# Patient Record
Sex: Female | Born: 1980 | Race: Black or African American | Hispanic: No | Marital: Married | Smoking: Never smoker
Health system: Southern US, Community
[De-identification: ages and names within clinical notes are randomized; demographics above are authoritative.]

## PROBLEM LIST (undated history)

## (undated) DIAGNOSIS — S0300XA Dislocation of jaw, unspecified side, initial encounter: Secondary | ICD-10-CM

## (undated) DIAGNOSIS — E669 Obesity, unspecified: Secondary | ICD-10-CM

## (undated) DIAGNOSIS — IMO0002 Reserved for concepts with insufficient information to code with codable children: Secondary | ICD-10-CM

## (undated) DIAGNOSIS — Z8042 Family history of malignant neoplasm of prostate: Secondary | ICD-10-CM

## (undated) DIAGNOSIS — Z803 Family history of malignant neoplasm of breast: Secondary | ICD-10-CM

## (undated) DIAGNOSIS — R87619 Unspecified abnormal cytological findings in specimens from cervix uteri: Secondary | ICD-10-CM

## (undated) DIAGNOSIS — M546 Pain in thoracic spine: Secondary | ICD-10-CM

## (undated) DIAGNOSIS — E282 Polycystic ovarian syndrome: Secondary | ICD-10-CM

## (undated) DIAGNOSIS — R7303 Prediabetes: Secondary | ICD-10-CM

## (undated) DIAGNOSIS — I10 Essential (primary) hypertension: Secondary | ICD-10-CM

## (undated) DIAGNOSIS — Z800 Family history of malignant neoplasm of digestive organs: Secondary | ICD-10-CM

## (undated) DIAGNOSIS — Z808 Family history of malignant neoplasm of other organs or systems: Secondary | ICD-10-CM

## (undated) DIAGNOSIS — R52 Pain, unspecified: Secondary | ICD-10-CM

## (undated) DIAGNOSIS — B349 Viral infection, unspecified: Secondary | ICD-10-CM

## (undated) DIAGNOSIS — E559 Vitamin D deficiency, unspecified: Secondary | ICD-10-CM

## (undated) DIAGNOSIS — J329 Chronic sinusitis, unspecified: Secondary | ICD-10-CM

## (undated) DIAGNOSIS — Z8041 Family history of malignant neoplasm of ovary: Secondary | ICD-10-CM

## (undated) DIAGNOSIS — R519 Headache, unspecified: Secondary | ICD-10-CM

## (undated) DIAGNOSIS — Z8049 Family history of malignant neoplasm of other genital organs: Secondary | ICD-10-CM

## (undated) DIAGNOSIS — E781 Pure hyperglyceridemia: Secondary | ICD-10-CM

## (undated) DIAGNOSIS — J45909 Unspecified asthma, uncomplicated: Secondary | ICD-10-CM

## (undated) HISTORY — DX: Polycystic ovarian syndrome: E28.2

## (undated) HISTORY — DX: Pain, unspecified: R52

## (undated) HISTORY — DX: Chronic sinusitis, unspecified: J32.9

## (undated) HISTORY — DX: Viral infection, unspecified: B34.9

## (undated) HISTORY — DX: Family history of malignant neoplasm of other organs or systems: Z80.8

## (undated) HISTORY — DX: Family history of malignant neoplasm of prostate: Z80.42

## (undated) HISTORY — DX: Dislocation of jaw, unspecified side, initial encounter: S03.00XA

## (undated) HISTORY — DX: Reserved for concepts with insufficient information to code with codable children: IMO0002

## (undated) HISTORY — DX: Family history of malignant neoplasm of breast: Z80.3

## (undated) HISTORY — DX: Vitamin D deficiency, unspecified: E55.9

## (undated) HISTORY — DX: Essential (primary) hypertension: I10

## (undated) HISTORY — DX: Obesity, unspecified: E66.9

## (undated) HISTORY — DX: Prediabetes: R73.03

## (undated) HISTORY — DX: Pure hyperglyceridemia: E78.1

## (undated) HISTORY — DX: Headache, unspecified: R51.9

## (undated) HISTORY — DX: Unspecified abnormal cytological findings in specimens from cervix uteri: R87.619

## (undated) HISTORY — DX: Family history of malignant neoplasm of ovary: Z80.41

## (undated) HISTORY — DX: Family history of malignant neoplasm of digestive organs: Z80.0

## (undated) HISTORY — DX: Family history of malignant neoplasm of other genital organs: Z80.49

## (undated) HISTORY — PX: LEEP: SHX91

## (undated) HISTORY — DX: Pain in thoracic spine: M54.6

---
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
## 2002-04-22 ENCOUNTER — Emergency Department (HOSPITAL_COMMUNITY): Admission: EM | Admit: 2002-04-22 | Discharge: 2002-04-22 | Payer: Self-pay | Admitting: Emergency Medicine

## 2002-08-11 ENCOUNTER — Inpatient Hospital Stay (HOSPITAL_COMMUNITY): Admission: AD | Admit: 2002-08-11 | Discharge: 2002-08-11 | Payer: Self-pay | Admitting: *Deleted

## 2002-08-28 ENCOUNTER — Encounter: Admission: RE | Admit: 2002-08-28 | Discharge: 2002-08-28 | Payer: Self-pay | Admitting: *Deleted

## 2002-11-04 ENCOUNTER — Emergency Department (HOSPITAL_COMMUNITY): Admission: EM | Admit: 2002-11-04 | Discharge: 2002-11-04 | Payer: Self-pay | Admitting: Emergency Medicine

## 2002-12-17 ENCOUNTER — Emergency Department (HOSPITAL_COMMUNITY): Admission: EM | Admit: 2002-12-17 | Discharge: 2002-12-17 | Payer: Self-pay | Admitting: Emergency Medicine

## 2003-05-21 ENCOUNTER — Other Ambulatory Visit: Admission: RE | Admit: 2003-05-21 | Discharge: 2003-05-21 | Payer: Self-pay | Admitting: Obstetrics and Gynecology

## 2003-06-01 ENCOUNTER — Emergency Department (HOSPITAL_COMMUNITY): Admission: EM | Admit: 2003-06-01 | Discharge: 2003-06-01 | Payer: Self-pay | Admitting: Emergency Medicine

## 2003-06-04 ENCOUNTER — Encounter: Payer: Self-pay | Admitting: Obstetrics and Gynecology

## 2003-06-04 ENCOUNTER — Ambulatory Visit (HOSPITAL_COMMUNITY): Admission: RE | Admit: 2003-06-04 | Discharge: 2003-06-04 | Payer: Self-pay | Admitting: Obstetrics and Gynecology

## 2003-06-10 ENCOUNTER — Inpatient Hospital Stay (HOSPITAL_COMMUNITY): Admission: AD | Admit: 2003-06-10 | Discharge: 2003-06-10 | Payer: Self-pay | Admitting: Obstetrics and Gynecology

## 2003-06-18 ENCOUNTER — Emergency Department (HOSPITAL_COMMUNITY): Admission: EM | Admit: 2003-06-18 | Discharge: 2003-06-18 | Payer: Self-pay | Admitting: Emergency Medicine

## 2003-09-20 ENCOUNTER — Emergency Department (HOSPITAL_COMMUNITY): Admission: EM | Admit: 2003-09-20 | Discharge: 2003-09-20 | Payer: Self-pay | Admitting: Emergency Medicine

## 2003-10-07 ENCOUNTER — Other Ambulatory Visit: Admission: RE | Admit: 2003-10-07 | Discharge: 2003-10-07 | Payer: Self-pay | Admitting: Obstetrics and Gynecology

## 2004-03-23 ENCOUNTER — Other Ambulatory Visit: Admission: RE | Admit: 2004-03-23 | Discharge: 2004-03-23 | Payer: Self-pay | Admitting: Obstetrics and Gynecology

## 2004-05-06 ENCOUNTER — Inpatient Hospital Stay (HOSPITAL_COMMUNITY): Admission: AD | Admit: 2004-05-06 | Discharge: 2004-05-06 | Payer: Self-pay | Admitting: Obstetrics and Gynecology

## 2004-05-07 ENCOUNTER — Inpatient Hospital Stay (HOSPITAL_COMMUNITY): Admission: AD | Admit: 2004-05-07 | Discharge: 2004-05-08 | Payer: Self-pay | Admitting: Obstetrics and Gynecology

## 2004-06-21 ENCOUNTER — Inpatient Hospital Stay (HOSPITAL_COMMUNITY): Admission: AD | Admit: 2004-06-21 | Discharge: 2004-06-21 | Payer: Self-pay | Admitting: Obstetrics and Gynecology

## 2004-08-15 ENCOUNTER — Ambulatory Visit (HOSPITAL_COMMUNITY): Admission: RE | Admit: 2004-08-15 | Discharge: 2004-08-15 | Payer: Self-pay | Admitting: Obstetrics and Gynecology

## 2004-08-25 ENCOUNTER — Inpatient Hospital Stay (HOSPITAL_COMMUNITY): Admission: AD | Admit: 2004-08-25 | Discharge: 2004-08-25 | Payer: Self-pay | Admitting: Obstetrics & Gynecology

## 2004-09-04 ENCOUNTER — Inpatient Hospital Stay (HOSPITAL_COMMUNITY): Admission: AD | Admit: 2004-09-04 | Discharge: 2004-09-05 | Payer: Self-pay | Admitting: Obstetrics and Gynecology

## 2004-09-11 HISTORY — PX: LEEP: SHX91

## 2004-09-16 ENCOUNTER — Encounter
Admission: RE | Admit: 2004-09-16 | Discharge: 2004-10-14 | Payer: Self-pay | Admitting: Physical Medicine and Rehabilitation

## 2004-11-22 ENCOUNTER — Inpatient Hospital Stay (HOSPITAL_COMMUNITY): Admission: AD | Admit: 2004-11-22 | Discharge: 2004-11-22 | Payer: Self-pay | Admitting: Obstetrics and Gynecology

## 2004-11-27 ENCOUNTER — Inpatient Hospital Stay (HOSPITAL_COMMUNITY): Admission: AD | Admit: 2004-11-27 | Discharge: 2004-11-27 | Payer: Self-pay | Admitting: Obstetrics and Gynecology

## 2004-11-29 ENCOUNTER — Inpatient Hospital Stay (HOSPITAL_COMMUNITY): Admission: AD | Admit: 2004-11-29 | Discharge: 2004-12-01 | Payer: Self-pay | Admitting: Obstetrics and Gynecology

## 2004-12-27 ENCOUNTER — Emergency Department (HOSPITAL_COMMUNITY): Admission: EM | Admit: 2004-12-27 | Discharge: 2004-12-27 | Payer: Self-pay | Admitting: Family Medicine

## 2005-01-03 ENCOUNTER — Other Ambulatory Visit: Admission: RE | Admit: 2005-01-03 | Discharge: 2005-01-03 | Payer: Self-pay | Admitting: Obstetrics and Gynecology

## 2005-08-28 ENCOUNTER — Encounter: Payer: Self-pay | Admitting: Emergency Medicine

## 2005-08-28 ENCOUNTER — Observation Stay (HOSPITAL_COMMUNITY): Admission: EM | Admit: 2005-08-28 | Discharge: 2005-08-29 | Payer: Self-pay | Admitting: Internal Medicine

## 2005-08-28 ENCOUNTER — Ambulatory Visit: Payer: Self-pay | Admitting: Internal Medicine

## 2005-08-29 ENCOUNTER — Emergency Department (HOSPITAL_COMMUNITY): Admission: EM | Admit: 2005-08-29 | Discharge: 2005-08-30 | Payer: Self-pay | Admitting: Emergency Medicine

## 2006-03-09 IMAGING — US US OB LIMITED
1 series · 13 of 28 positions shown · non-contrast
Comparison: none

CLINICAL DATA: 23-year-old.  38 weeks pregnancy with failed biophysical profile in office.

[Series 1: us ob limited · 0.37mm/px · 13 of 35 slices shown]
[im 2/35]
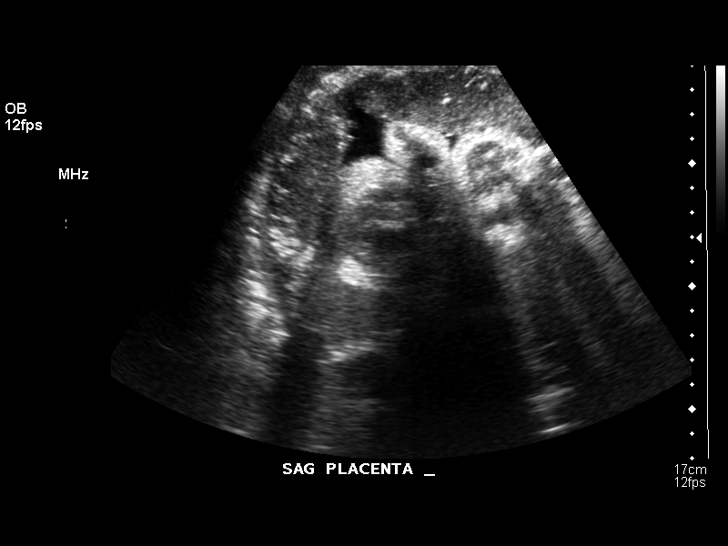
[im 4/35]
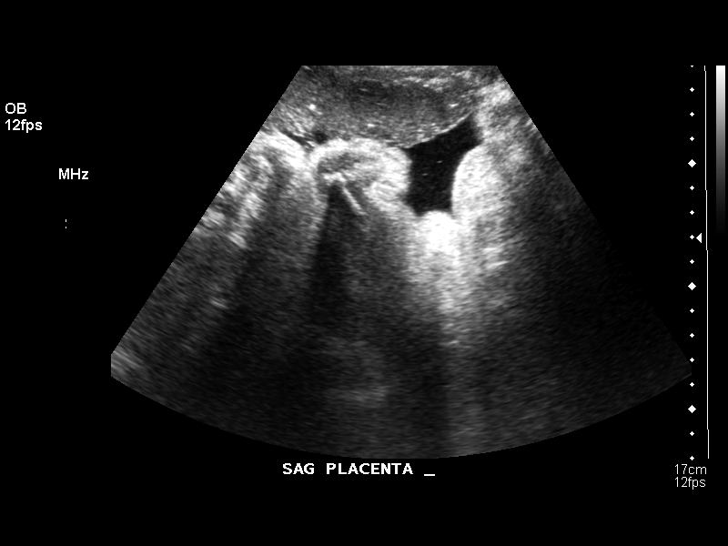
[im 7/35]
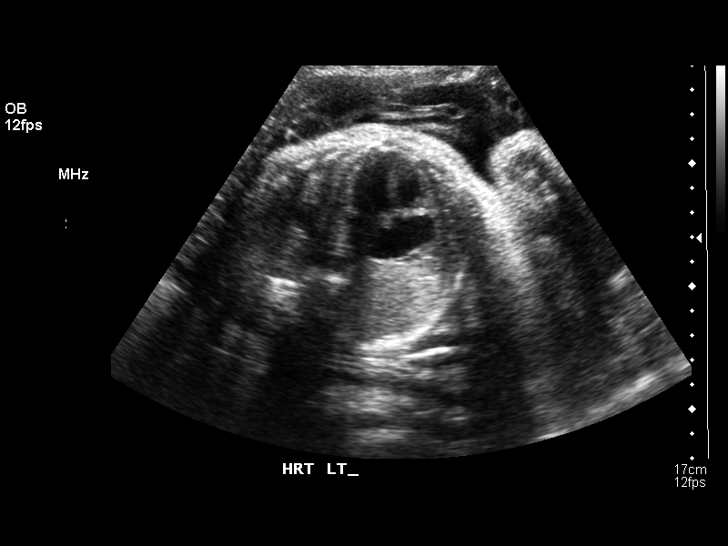
[im 9/35]
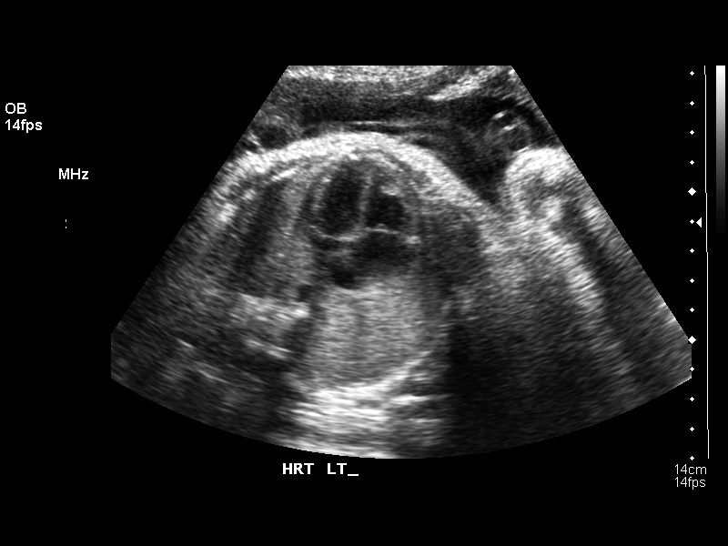
[im 12/35]
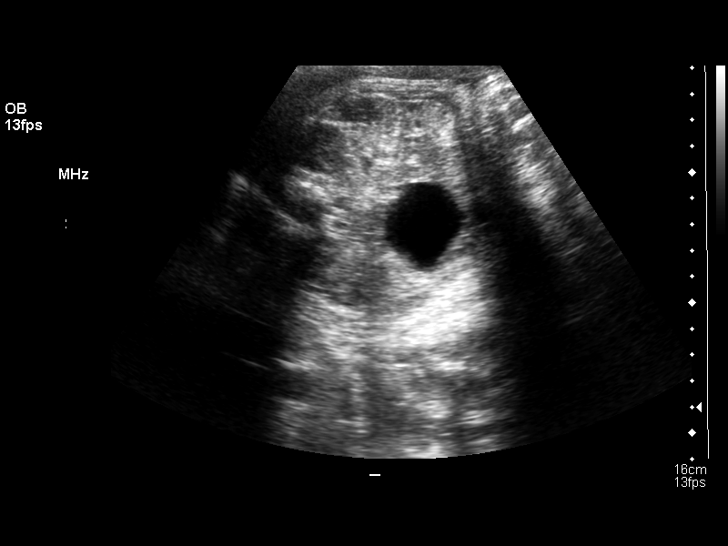
[im 14/35]
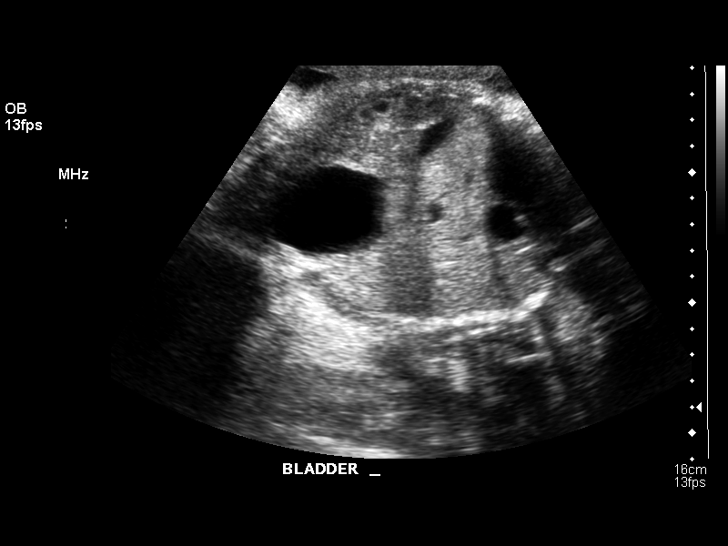
[im 18/35]
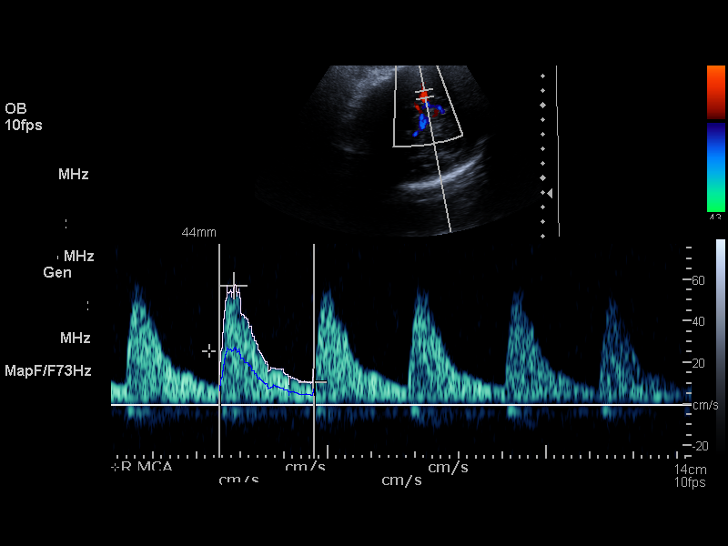
[im 21/35]
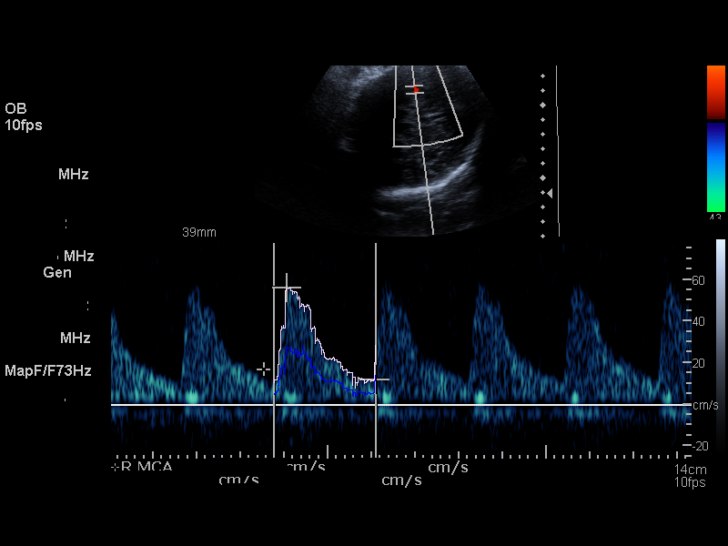
[im 23/35]
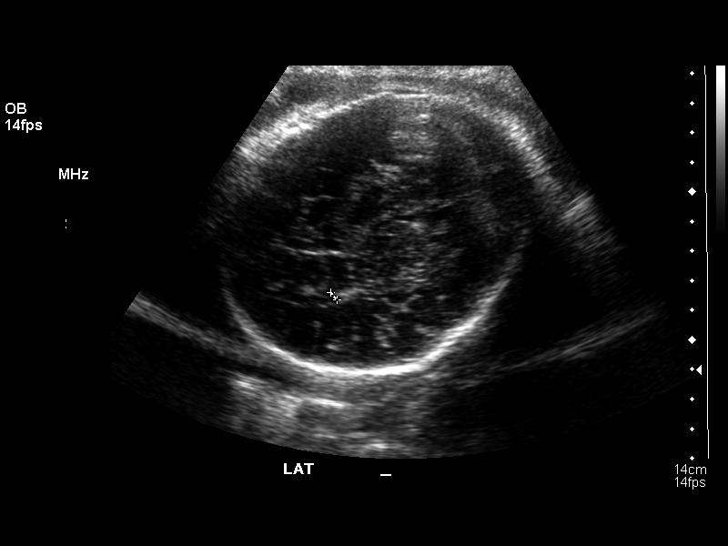
[im 26/35]
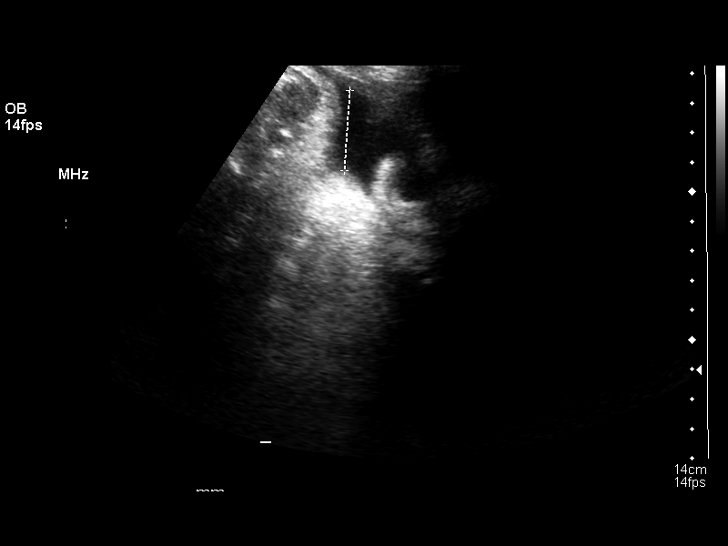
[im 28/35]
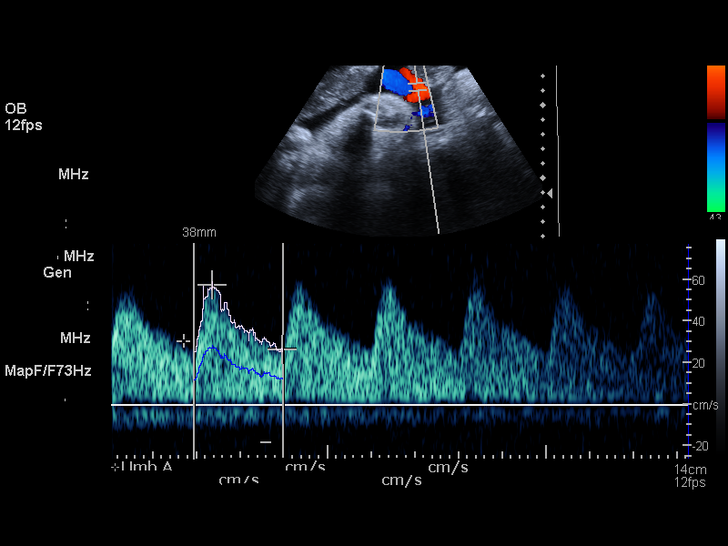
[im 31/35]
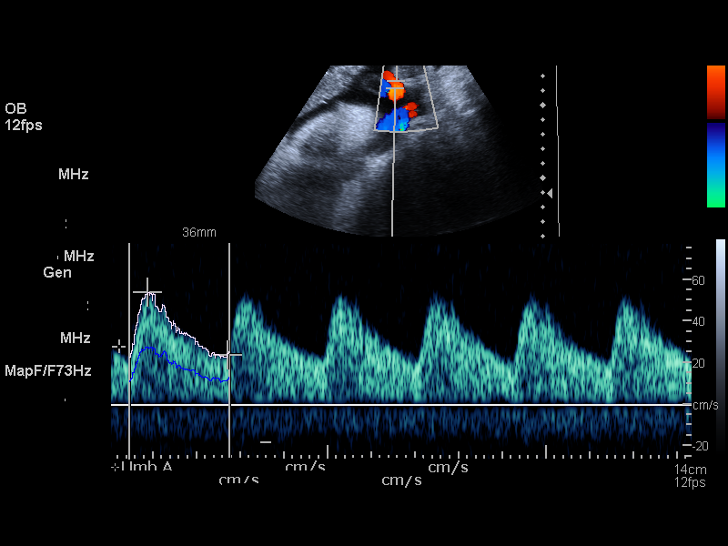
[im 33/35]
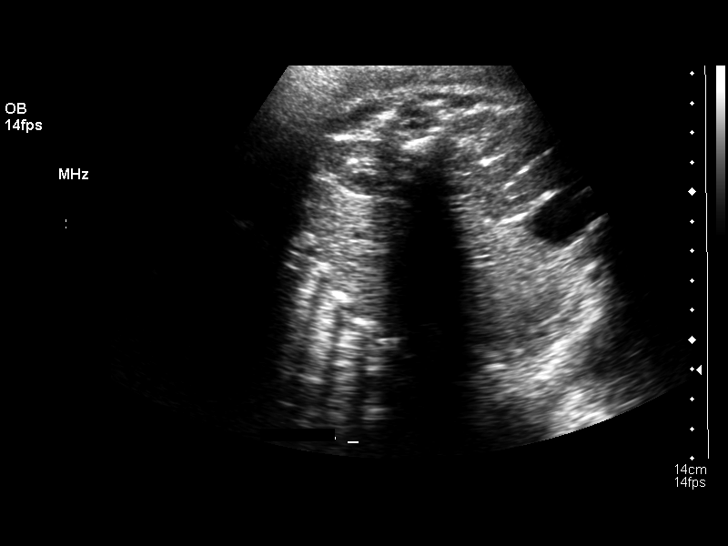

[13 of 28 positions shown; findings below may reference images not displayed]

LIMITED OBSTETRICAL ULTRASOUND:
 Number of Fetuses:  1
 Heart Rate:  133
 Movement:  Yes
 Breathing:  Yes
 Presentation:  Cephalic
 Placental Location:  Anterior
 Grade:  II
 Previa:  No
 Amniotic Fluid (Subjective):  Normal
 Amniotic Fluid (Objective):  12.2 cm AFI (5th -95th%ile = 7.3 ? 23.9 cm for 38 wks)

 Fetal measurements and complete anatomic evaluation were not requested.  The following fetal anatomy was visualized during this exam:  Lateral ventricles, stomach, 3-vessel cord, kidneys, bladder, and diaphragm.

 MATERNAL UTERINE AND ADNEXAL FINDINGS
 Cervix:  Not evaluated
 BIOPHYSICAL PROFILE

 Movement:  2      Time:  30 minutes
 Breathing:  2
 Tone:  2
 Amniotic Fluid:  2

 Total Score:  8

 DOPPLER ULTRASOUND OF FETUS:

 Umbilical Artery S/D Ratio:  2.34    (NL< 3.03)

 Middle Cerebral Artery PI: 1.62    (NL> 1.07)
IMPRESSION: 1.  Single living intrauterine fetus estimated at 37 weeks and 5 days by first ultrasound.  Fetus is in cephalic presentation with anterior grade II placenta and subjectively normal amniotic fluid volume.
 2.  Biophysical profile score [DATE] at 30 minutes.
 3.  Normal cord and MCA Doppler examination.

## 2006-04-03 ENCOUNTER — Inpatient Hospital Stay (HOSPITAL_COMMUNITY): Admission: AD | Admit: 2006-04-03 | Discharge: 2006-04-03 | Payer: Self-pay | Admitting: Obstetrics and Gynecology

## 2006-12-07 ENCOUNTER — Emergency Department (HOSPITAL_COMMUNITY): Admission: EM | Admit: 2006-12-07 | Discharge: 2006-12-07 | Payer: Self-pay | Admitting: Emergency Medicine

## 2007-01-01 ENCOUNTER — Inpatient Hospital Stay (HOSPITAL_COMMUNITY): Admission: AD | Admit: 2007-01-01 | Discharge: 2007-01-01 | Payer: Self-pay | Admitting: Obstetrics & Gynecology

## 2007-04-28 ENCOUNTER — Emergency Department (HOSPITAL_COMMUNITY): Admission: EM | Admit: 2007-04-28 | Discharge: 2007-04-28 | Payer: Self-pay | Admitting: *Deleted

## 2008-05-04 ENCOUNTER — Emergency Department (HOSPITAL_COMMUNITY): Admission: EM | Admit: 2008-05-04 | Discharge: 2008-05-04 | Payer: Self-pay | Admitting: Family Medicine

## 2008-12-08 ENCOUNTER — Emergency Department (HOSPITAL_COMMUNITY): Admission: EM | Admit: 2008-12-08 | Discharge: 2008-12-08 | Payer: Self-pay | Admitting: Family Medicine

## 2009-04-30 ENCOUNTER — Encounter (INDEPENDENT_AMBULATORY_CARE_PROVIDER_SITE_OTHER): Payer: Self-pay | Admitting: Obstetrics and Gynecology

## 2009-04-30 ENCOUNTER — Ambulatory Visit (HOSPITAL_COMMUNITY): Admission: RE | Admit: 2009-04-30 | Discharge: 2009-04-30 | Payer: Self-pay | Admitting: Obstetrics and Gynecology

## 2009-09-25 ENCOUNTER — Emergency Department (HOSPITAL_COMMUNITY): Admission: EM | Admit: 2009-09-25 | Discharge: 2009-09-25 | Payer: Self-pay | Admitting: Emergency Medicine

## 2009-10-24 ENCOUNTER — Emergency Department (HOSPITAL_BASED_OUTPATIENT_CLINIC_OR_DEPARTMENT_OTHER): Admission: EM | Admit: 2009-10-24 | Discharge: 2009-10-24 | Payer: Self-pay | Admitting: Emergency Medicine

## 2010-03-07 ENCOUNTER — Emergency Department (HOSPITAL_BASED_OUTPATIENT_CLINIC_OR_DEPARTMENT_OTHER): Admission: EM | Admit: 2010-03-07 | Discharge: 2010-03-08 | Payer: Self-pay | Admitting: Emergency Medicine

## 2010-09-25 ENCOUNTER — Inpatient Hospital Stay (HOSPITAL_COMMUNITY)
Admission: AD | Admit: 2010-09-25 | Discharge: 2010-09-25 | Payer: Self-pay | Source: Home / Self Care | Attending: Obstetrics and Gynecology | Admitting: Obstetrics and Gynecology

## 2010-09-26 LAB — URINALYSIS, ROUTINE W REFLEX MICROSCOPIC
Bilirubin Urine: NEGATIVE
Ketones, ur: NEGATIVE mg/dL
Nitrite: NEGATIVE
Protein, ur: NEGATIVE mg/dL
Specific Gravity, Urine: 1.02 (ref 1.005–1.030)
Urine Glucose, Fasting: NEGATIVE mg/dL
Urobilinogen, UA: 0.2 mg/dL (ref 0.0–1.0)
pH: 6.5 (ref 5.0–8.0)

## 2010-09-26 LAB — WET PREP, GENITAL
Clue Cells Wet Prep HPF POC: NONE SEEN
Trich, Wet Prep: NONE SEEN

## 2010-09-26 LAB — URINE MICROSCOPIC-ADD ON

## 2010-09-26 LAB — POCT PREGNANCY, URINE: Preg Test, Ur: NEGATIVE

## 2010-09-28 LAB — GC/CHLAMYDIA PROBE AMP, GENITAL
Chlamydia, DNA Probe: NEGATIVE
GC Probe Amp, Genital: NEGATIVE

## 2010-10-20 ENCOUNTER — Encounter: Payer: Self-pay | Admitting: Physician Assistant

## 2010-10-24 ENCOUNTER — Encounter: Payer: Self-pay | Admitting: Obstetrics and Gynecology

## 2010-10-24 ENCOUNTER — Other Ambulatory Visit: Payer: Self-pay | Admitting: Obstetrics and Gynecology

## 2010-10-24 ENCOUNTER — Encounter (INDEPENDENT_AMBULATORY_CARE_PROVIDER_SITE_OTHER): Payer: Self-pay | Admitting: Obstetrics and Gynecology

## 2010-10-24 DIAGNOSIS — R109 Unspecified abdominal pain: Secondary | ICD-10-CM

## 2010-10-24 DIAGNOSIS — N912 Amenorrhea, unspecified: Secondary | ICD-10-CM

## 2010-10-24 DIAGNOSIS — R1084 Generalized abdominal pain: Secondary | ICD-10-CM

## 2010-10-24 LAB — CONVERTED CEMR LAB
FSH: 4.9 milliintl units/mL
LH: 8.9 milliintl units/mL
Prolactin: 5.6 ng/mL
Sex Hormone Binding: 162 nmol/L — ABNORMAL HIGH (ref 18–114)
Testosterone Free: 2.2 pg/mL (ref 0.6–6.8)
Testosterone-% Free: 0.5 % (ref 0.4–2.4)
Testosterone: 40.63 ng/dL (ref 10–70)

## 2010-10-27 ENCOUNTER — Ambulatory Visit (HOSPITAL_COMMUNITY)
Admission: RE | Admit: 2010-10-27 | Discharge: 2010-10-27 | Disposition: A | Discharge status: Home / Self Care | Payer: Self-pay | Source: Ambulatory Visit | Attending: Obstetrics and Gynecology | Admitting: Obstetrics and Gynecology

## 2010-10-27 ENCOUNTER — Ambulatory Visit (HOSPITAL_COMMUNITY): Payer: Self-pay

## 2010-10-27 DIAGNOSIS — R109 Unspecified abdominal pain: Secondary | ICD-10-CM

## 2010-10-27 DIAGNOSIS — N838 Other noninflammatory disorders of ovary, fallopian tube and broad ligament: Secondary | ICD-10-CM | POA: Insufficient documentation

## 2010-10-27 DIAGNOSIS — R1031 Right lower quadrant pain: Secondary | ICD-10-CM | POA: Insufficient documentation

## 2010-10-27 DIAGNOSIS — N912 Amenorrhea, unspecified: Secondary | ICD-10-CM | POA: Insufficient documentation

## 2010-10-27 MED ORDER — IOHEXOL 300 MG/ML  SOLN
100.0000 mL | Freq: Once | INTRAMUSCULAR | Status: AC | PRN
  Administered 2010-10-27: 100 mL via INTRAVENOUS

## 2010-11-10 ENCOUNTER — Ambulatory Visit (INDEPENDENT_AMBULATORY_CARE_PROVIDER_SITE_OTHER): Payer: Self-pay | Admitting: Obstetrics and Gynecology

## 2010-11-10 DIAGNOSIS — N9489 Other specified conditions associated with female genital organs and menstrual cycle: Secondary | ICD-10-CM

## 2010-12-02 NOTE — Progress Notes (Unsigned)
NAME:  Jasmine, Castaneda NO.:  [id number]  MEDICAL RECORD NO.:  [id number]           PATIENT TYPE:  A  LOCATION:  [facility]                   FACILITY:  [facility]  PHYSICIAN:  Argentina Donovan, MD        DATE OF BIRTH:  10-24-1980  DATE OF SERVICE:  10/24/2010                                 CLINIC NOTE  The patient is a 30 year old African American married female, gravida 1, para 1-0-0-1 with a child 28 years old.  Following the birth of her baby, she got a Mirena IUD, had in about 4 years and had repeated episodes of vulval vaginitis, bacterial and yeast infections that she states, in for that reason, it was removed.  She never had another period.  Although they gave her some birth control pills at that time, told her to start them when she had a period.  She kept them for 2 years, not having had a period and then started them just in the beginning of January.  She at that time had gone to a dermatologist because of a darkened spot on her face and they did a biopsy of that.  She told him that she was on the pill, they told her to stop it because they did not know what is causing discoloration, not knowing I expect how long she had been on and then she had a period that stopped of several weeks later she had another. For several years she has had abdominal pain.  She has no GI complaints except constipation and no significant pelvic complaints.  On examination, her abdomen is soft, flat, quite tender in the periumbilical and just up especially below the umbilicus area and when pushing deeply on any portion of the abdomen that seems to be the area that hurts although when you ask with the pain she points to the lower right quadrant.  She has no guarding or rebound.  On pelvic exam, the external genitalia is normal.  BUS within normal limits.  Vagina is clean and well rugated.  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  She had been referred here by the [facility] where she went, they diagnosed urinary tract infection and yeast infection and put her on Diflucan.  She has allergies to SULFA and is on no medications at the present time.  Her story is quite confusing although she is a good historian.  She has a surgical history of a D&C and a LEEP procedure and fortunately her last Pap was normal.  I am not exactly sure what is going on with this lady.  She does not look like polycystic ovarian syndrome.  She is 168 pounds and 5 feet 8 inches tall.  She says that the pain is in her abdomen was periodic until just recently its become much more frequent and steady.  She wakes her up sometimes at night and she apparently has just been able to put up with it.  I am going to get a CT of the abdomen with and without, and also with pelvic sonogram, I am  going to get FSH, LH, prolactin, free testosterone levels, and I am going to have her come back in 2 weeks and see if we can figure out what is going on with this woman, I am truly confused by her story.  IMPRESSION:  Abdominal pain, long periods of amenorrhea with recent period probably started by oral contraceptives.          ______________________________ Argentina Donovan, MD    PR/MEDQ  D:  10/24/2010  T:  10/25/2010  Job:  [id number]

## 2010-12-02 NOTE — Progress Notes (Unsigned)
NAME:  Jasmine Castaneda, Jasmine Castaneda NO.:  [id number]  MEDICAL RECORD NO.:  [id number]           PATIENT TYPE:  A  LOCATION:  [facility]                   FACILITY:  [facility]  PHYSICIAN:  Argentina Donovan, MD        DATE OF BIRTH:  03-31-1981  DATE OF SERVICE:                                 CLINIC NOTE  HISTORY OF PRESENT ILLNESS:  The patient is a 30 year old gravida 1, para 1-0-70-69 who is 67-year-old whose history has been reviewed with the previous note.  She developed irregular periods and came in.  She certainly does not look like polycystic ovarian syndrome.  We had a CT because of some abdominal pain which was negative.  The only abnormalities when we did her hormone levels was a sex hormone binding globulin of 162, but her free testosterone, FSH, LH were all normal. The patient, however, on CT of the abdomen and pelvis revealed prominent ovaries bilaterally, compatible with sonographic findings of polycystic ovarian syndrome.  What we are doing with the patient is giving her at her request after discussing polycystic ovarian syndrome with her Provera for the first 10 days of each month.  We have told her that once she gets pregnant, she stops that wait a few months and then if necessary, she will be treated with hormone.  IMPRESSION:  Polycystic ovarian syndrome.          ______________________________ Argentina Donovan, MD    PR/MEDQ  D:  11/10/2010  T:  11/11/2010  Job:  [id number]

## 2010-12-17 LAB — CBC
HCT: 39.9 % (ref 36.0–46.0)
Hemoglobin: 13.6 g/dL (ref 12.0–15.0)
MCHC: 34.1 g/dL (ref 30.0–36.0)
MCV: 92.6 fL (ref 78.0–100.0)
Platelets: 270 10*3/uL (ref 150–400)
RBC: 4.31 MIL/uL (ref 3.87–5.11)
RDW: 12.3 % (ref 11.5–15.5)
WBC: 4.2 10*3/uL (ref 4.0–10.5)

## 2010-12-17 LAB — PREGNANCY, URINE: Preg Test, Ur: NEGATIVE

## 2011-01-16 ENCOUNTER — Ambulatory Visit (INDEPENDENT_AMBULATORY_CARE_PROVIDER_SITE_OTHER): Payer: Self-pay | Admitting: Obstetrics and Gynecology

## 2011-01-16 DIAGNOSIS — N912 Amenorrhea, unspecified: Secondary | ICD-10-CM

## 2011-01-17 NOTE — Group Therapy Note (Signed)
NAME:  VALYN, LATCHFORD NO.:  [id number]  MEDICAL RECORD NO.:  [id number]           PATIENT TYPE:  A  LOCATION:  [facility]                   [facility]ACILITY[facility]  PHYSICIAN:  Argentina Donovan, MD        DATE OF BIRTH:  01-25-1981  DATE OF SERVICE:  01/16/2011                                 CLINIC NOTE  HISTORY OF PRESENT ILLNESS:  The patient is a 30 year old gravida 1, para 1-39-39-29 with 74-year-old who has polycystic ovarian syndrome and had anovulation.  She has been taking Provera for the first 10 days of each month and desires pregnancy.  We have given her temperature chart, discussed how to take it, started on Clomid 50 from day 5 through 9 of cycle and with 3 renewals.  If she is not pregnant at the end of the time, she will come back and we will reevaluate, possibly increase the Clomid dose.  IMPRESSION: 1. Polycystic ovarian syndrome. 2. Anovulation. 3. Amenorrhea. 4. Intention of pregnancy.          ______________________________ Argentina Donovan, MD    PR/MEDQ  D:  01/16/2011  T:  01/17/2011  Job:  [id number]

## 2011-01-24 NOTE — Op Note (Signed)
Jasmine Castaneda, Jasmine Castaneda               ACCOUNT NO.:  [id number]   MEDICAL RECORD NO.:  [id number]          PATIENT TYPE:  AMB   LOCATION:  [facility]                           [facility]   PHYSICIAN:  Dineen Kid. Rana Snare, M.D.    DATE OF BIRTH:  [birth year]   DATE OF PROCEDURE:  04/30/2009  DATE OF DISCHARGE:  04/30/2009                               OPERATIVE REPORT   PREOPERATIVE DIAGNOSES:  Abnormal uterine bleeding and irregular  endometrium.   POSTOPERATIVE DIAGNOSES:  Abnormal uterine bleeding and irregular  endometrium.   PROCEDURE:  Hysteroscopy and dilation and curettage.   SURGEON:  Dineen Kid. Rana Snare, M.D.   ANESTHESIA:  Monitored anesthetic care and paracervical block.   INDICATIONS:  Jasmine Castaneda is a 30 year old with irregular bleeding  despite birth control pills.  She has been bleeding for the last 2-3  weeks, underwent a saline infusion ultrasound showing a very irregular  endometrium.  She desires definitive surgical intervention, requests  hysteroscopy, dilation and curettage.  Risks and benefits of the  procedure were discussed at length.  Informed consent was obtained.   FINDINGS:  At the time of surgery, very irregular endometrium otherwise  normal-appearing ostia and cervix after the D&C.   DESCRIPTION OF PROCEDURE:  After adequate analgesia, the patient placed  in the dorsal lithotomy position.  She was sterilely prepped and draped.  The bladder was sterilely drained.  Graves speculum was placed.  Tenaculum placed on the anterior lip of the cervix.  Paracervical block  was placed in 1% Xylocaine 1:100,000 epinephrine, total of 20 mL.  Uterus was sounded 8 cm, easily dilated with #27 Shawnie Pons dilators.  Hysteroscope was inserted.  The above findings were noted.  Curettage  performed retrieving small fragments of endometrium until a gritty  surface was felt throughout the endometrial cavity.  Reexamination  revealed normal-appearing endometrial cavity, normal-appearing ostia  and  cervix.  Hysteroscope was removed.  Tenaculum at the anterior lip of the  cervix was noted to be hemostatic.  The patient was then transferred to  the recovery room in stable condition.  Sponge and instrument count was  normal x3.  Estimated blood loss was minimal.  Sorbitol deficit 50 mL.   DISPOSITION:  The patient will be discharged home and will follow up in  the office in 2-3 weeks.  Given the routine instruction sheet for D and  C.  Told to return for increased pain, fever, or bleeding.  She should  start her birth control pills in the next menstrual cycle.  She did  receive 1 g of cefotetan preoperatively and 30 mg Toradol  postoperatively.     Dineen Kid Rana Snare, M.D.  Electronically Signed    DCL/MEDQ  D:  04/30/2009  T:  05/01/2009  Job:  [id number]
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
## 2011-03-08 ENCOUNTER — Ambulatory Visit: Payer: Self-pay | Admitting: Obstetrics and Gynecology

## 2011-04-19 ENCOUNTER — Encounter: Payer: Self-pay | Admitting: *Deleted

## 2011-04-19 DIAGNOSIS — E282 Polycystic ovarian syndrome: Secondary | ICD-10-CM | POA: Insufficient documentation

## 2011-04-19 DIAGNOSIS — N970 Female infertility associated with anovulation: Secondary | ICD-10-CM | POA: Insufficient documentation

## 2011-04-19 DIAGNOSIS — R109 Unspecified abdominal pain: Secondary | ICD-10-CM | POA: Insufficient documentation

## 2011-04-19 DIAGNOSIS — N912 Amenorrhea, unspecified: Secondary | ICD-10-CM

## 2011-04-20 ENCOUNTER — Ambulatory Visit: Payer: Self-pay | Admitting: Obstetrics and Gynecology

## 2011-06-05 ENCOUNTER — Emergency Department (HOSPITAL_BASED_OUTPATIENT_CLINIC_OR_DEPARTMENT_OTHER)
Admission: EM | Admit: 2011-06-05 | Discharge: 2011-06-05 | Disposition: A | Discharge status: Home / Self Care | Payer: Self-pay | Attending: Emergency Medicine | Admitting: Emergency Medicine

## 2011-06-05 ENCOUNTER — Encounter (HOSPITAL_BASED_OUTPATIENT_CLINIC_OR_DEPARTMENT_OTHER): Payer: Self-pay

## 2011-06-05 DIAGNOSIS — R21 Rash and other nonspecific skin eruption: Secondary | ICD-10-CM | POA: Insufficient documentation

## 2011-06-05 DIAGNOSIS — Z79899 Other long term (current) drug therapy: Secondary | ICD-10-CM | POA: Insufficient documentation

## 2011-06-05 LAB — CBC
HCT: 36.1 % (ref 36.0–46.0)
Hemoglobin: 12.4 g/dL (ref 12.0–15.0)
RBC: 4.03 MIL/uL (ref 3.87–5.11)
WBC: 5 10*3/uL (ref 4.0–10.5)

## 2011-06-05 LAB — DIFFERENTIAL
Basophils Absolute: 0 10*3/uL (ref 0.0–0.1)
Lymphocytes Relative: 47 % — ABNORMAL HIGH (ref 12–46)
Lymphs Abs: 2.3 10*3/uL (ref 0.7–4.0)
Monocytes Absolute: 0.4 10*3/uL (ref 0.1–1.0)
Monocytes Relative: 7 % (ref 3–12)
Neutro Abs: 2.2 10*3/uL (ref 1.7–7.7)

## 2011-06-05 LAB — BASIC METABOLIC PANEL
BUN: 10 mg/dL (ref 6–23)
CO2: 25 mEq/L (ref 19–32)
Chloride: 103 mEq/L (ref 96–112)
Creatinine, Ser: 0.8 mg/dL (ref 0.50–1.10)
Glucose, Bld: 84 mg/dL (ref 70–99)

## 2011-06-05 MED ORDER — CEPHALEXIN 250 MG PO CAPS
500.0000 mg | ORAL_CAPSULE | Freq: Once | ORAL | Status: AC
  Administered 2011-06-05: 500 mg via ORAL
  Filled 2011-06-05: qty 2

## 2011-06-05 MED ORDER — DIPHENHYDRAMINE HCL 25 MG PO TABS: 25.0000 mg | ORAL_TABLET | Freq: Four times a day (QID) | ORAL | Status: AC

## 2011-06-05 MED ORDER — PREDNISONE 50 MG PO TABS
60.0000 mg | ORAL_TABLET | Freq: Once | ORAL | Status: AC
  Administered 2011-06-05: 60 mg via ORAL

## 2011-06-05 MED ORDER — DOXYCYCLINE HYCLATE 100 MG PO CAPS: 100.0000 mg | ORAL_CAPSULE | Freq: Two times a day (BID) | ORAL | Status: AC

## 2011-06-05 MED ORDER — PREDNISONE 20 MG PO TABS
ORAL_TABLET | ORAL | Status: AC
  Filled 2011-06-05: qty 3

## 2011-06-05 MED ORDER — PREDNISONE 10 MG PO TABS: 20.0000 mg | ORAL_TABLET | Freq: Every day | ORAL | Status: AC

## 2011-06-05 NOTE — ED Provider Notes (Signed)
History    Scribed for Hilario Quarry, MD, the patient was seen in room MH11/MH11. This chart was scribed by Katha Cabal. This patient's care was started at 7:07 PM.     CSN: [id number] Arrival date & time: 06/05/2011  6:20 PM  Chief Complaint  Patient presents with  . Leg Pain    HPI  (Consider location/radiation/quality/duration/timing/severity/associated sxs/prior treatment)  HPI Jasmine Castaneda is a 30 y.o. female who presents to the Emergency Department complaining of gradual spreading of rash bilateral lower extremity rash that began suddenly at church yesterday. Denies SOB, cough, rhinorrhea, weakness, headache.  Rash began as one knot and spread across both legs in 36 hours.  Patient had fever of 100.2.  Patient was wearing a dress and sat in her regular seat at church.  Patient took Benadryl and Tylenol.  LNMP:  06/04/2011 No PCP  Dr. Okey Dupre at [facility]   PAST MEDICAL HISTORY:  Past Medical History  Diagnosis Date  . Abnormal Pap smear     leep  . Infertility     PAST SURGICAL HISTORY:  History reviewed. No pertinent past surgical history.  FAMILY HISTORY:  Family History  Problem Relation Age of Onset  . Hypertension Father   . Cancer Mother      SOCIAL HISTORY: History   Social History  . Marital Status: Married    Spouse Name: N/A    Number of Children: N/A  . Years of Education: N/A   Social History Main Topics  . Smoking status: Never Smoker   . Smokeless tobacco: None  . Alcohol Use: No  . Drug Use: No  . Sexually Active: None   Other Topics Concern  . None   Social History Narrative  . None    Review of Systems  Review of Systems 10 Systems reviewed and are negative for acute change except as noted in the HPI.  Allergies  Septra  Home Medications   Current Outpatient Rx  Name Route Sig Dispense Refill  . ACETAMINOPHEN 500 MG PO TABS Oral Take 1,000 mg by mouth every 6 (six) hours as needed. pain     .  CLOMIPHENE CITRATE 50 MG PO TABS Oral Take 50 mg by mouth daily. Take on days 5-9 of cycle     . DIPHENHYDRAMINE HCL (SLEEP) 25 MG PO TABS Oral Take 25 mg by mouth at bedtime as needed.      Marland Kitchen MEDROXYPROGESTERONE ACETATE 10 MG PO TABS Oral Take 10 mg by mouth daily. Take on the first 10 days of the month     . ONE-DAILY MULTI VITAMINS PO TABS Oral Take 1 tablet by mouth daily.      Marland Kitchen DIPHENHYDRAMINE HCL 25 MG PO TABS Oral Take 1 tablet (25 mg total) by mouth every 6 (six) hours. 20 tablet 0  . DOXYCYCLINE HYCLATE 100 MG PO CAPS Oral Take 1 capsule (100 mg total) by mouth 2 (two) times daily. 20 capsule 0  . PREDNISONE 10 MG PO TABS Oral Take 2 tablets (20 mg total) by mouth daily. 15 tablet 0    Physical Exam    BP 126/84  Pulse 72  Temp(Src) 98.6 F (37 C) (Oral)  Resp 16  Ht 5\' 8"  (1.727 m)  Wt 167 lb (75.751 kg)  BMI 25.39 kg/m2  SpO2 100%  LMP 06/04/2011  Physical Exam  Constitutional: She is oriented to person, place, and time. She appears well-developed and well-nourished.  HENT:  Head: Normocephalic and  atraumatic.  Eyes: EOM are normal. Pupils are equal, round, and reactive to light.  Neck: Neck supple.  Cardiovascular: Normal rate and regular rhythm.   Pulmonary/Chest: Effort normal. No respiratory distress.  Abdominal: Soft. There is no tenderness.  Musculoskeletal: Normal range of motion.  Neurological: She is alert and oriented to person, place, and time.  Skin: Skin is warm. Rash noted. There is erythema.       Erythematous, Indurated, influctuant area about 12x12 cm on posterior aspect of bilateral thighs   Psychiatric: She has a normal mood and affect. Her behavior is normal.    ED Course  Procedures (including critical care time)  OTHER DATA REVIEWED: Nursing notes, vital signs, and past medical records reviewed.   DIAGNOSTIC STUDIES: Oxygen Saturation is 100% on room air, normal by my interpretation.      LABS / RADIOLOGY:  Results for orders placed  during the hospital encounter of 06/05/11  CBC      Component Value Range   WBC 5.0  4.0 - 10.5 (K/uL)   RBC 4.03  3.87 - 5.11 (MIL/uL)   Hemoglobin 12.4  12.0 - 15.0 (g/dL)   HCT 30.8  65.7 - 84.6 (%)   MCV 89.6  78.0 - 100.0 (fL)   MCH 30.8  26.0 - 34.0 (pg)   MCHC 34.3  30.0 - 36.0 (g/dL)   RDW 96.2 (*) 95.2 - 15.5 (%)   Platelets 267  150 - 400 (K/uL)  DIFFERENTIAL      Component Value Range   Neutrophils Relative 44  43 - 77 (%)   Neutro Abs 2.2  1.7 - 7.7 (K/uL)   Lymphocytes Relative 47 (*) 12 - 46 (%)   Lymphs Abs 2.3  0.7 - 4.0 (K/uL)   Monocytes Relative 7  3 - 12 (%)   Monocytes Absolute 0.4  0.1 - 1.0 (K/uL)   Eosinophils Relative 2  0 - 5 (%)   Eosinophils Absolute 0.1  0.0 - 0.7 (K/uL)   Basophils Relative 1  0 - 1 (%)   Basophils Absolute 0.0  0.0 - 0.1 (K/uL)  BASIC METABOLIC PANEL      Component Value Range   Sodium 137  135 - 145 (mEq/L)   Potassium 4.2  3.5 - 5.1 (mEq/L)   Chloride 103  96 - 112 (mEq/L)   CO2 25  19 - 32 (mEq/L)   Glucose, Bld 84  70 - 99 (mg/dL)   BUN 10  6 - 23 (mg/dL)   Creatinine, Ser 8.41  0.50 - 1.10 (mg/dL)   Calcium 9.1  8.4 - 32.4 (mg/dL)   GFR calc non Af Amer >60  >60 (mL/min)   GFR calc Af Amer >60  >60 (mL/min)    No results found.    ED COURSE / COORDINATION OF CARE:  Orders Placed This Encounter  Procedures  . CBC  . Differential  . Basic metabolic panel    MDM:  Treat for contact dermatitis and infection.  Patient to continue taking Benadryl.   Will give patient Prednisone.  Patient is to return to ED if fever returns.    RN is to draw lines around rash. Return to ED for follow up tomorrow.    IMPRESSION: Diagnoses that have been ruled out:  Diagnoses that are still under consideration:  Final diagnoses:  Rash     MEDICATIONS GIVEN IN THE E.D. Scheduled Meds:    . cephALEXin  500 mg Oral Once  . predniSONE      .  predniSONE  60 mg Oral Once   Continuous Infusions:     DISCHARGE  MEDICATIONS: New Prescriptions   DIPHENHYDRAMINE (BENADRYL) 25 MG TABLET    Take 1 tablet (25 mg total) by mouth every 6 (six) hours.   DOXYCYCLINE (VIBRAMYCIN) 100 MG CAPSULE    Take 1 capsule (100 mg total) by mouth 2 (two) times daily.   PREDNISONE (DELTASONE) 10 MG TABLET    Take 2 tablets (20 mg total) by mouth daily.      I personally performed the services described in this documentation, which was scribed in my presence. The recorded information has been reviewed and considered. Hilario Quarry, MD          Hilario Quarry, MD 06/05/11 [phone]

## 2011-06-05 NOTE — ED Notes (Signed)
Borders of redness on bil legs marked with marker per md request

## 2011-06-05 NOTE — ED Notes (Signed)
C/o "knots to back of my legs"-first noticed while seated in church yesterday

## 2011-06-15 ENCOUNTER — Ambulatory Visit (INDEPENDENT_AMBULATORY_CARE_PROVIDER_SITE_OTHER): Payer: Self-pay | Admitting: Obstetrics and Gynecology

## 2011-06-15 ENCOUNTER — Encounter: Payer: Self-pay | Admitting: Obstetrics and Gynecology

## 2011-06-15 DIAGNOSIS — N898 Other specified noninflammatory disorders of vagina: Secondary | ICD-10-CM

## 2011-06-15 NOTE — Progress Notes (Signed)
States when made appointment was having vaginal pain and discharge, and no period- is on clomid and provera- not sure if took medications correctly- has several questions-Had a period 38th day of cycle- also still has lower abdominal intermittent pain

## 2011-06-23 LAB — ETHANOL: Alcohol, Ethyl (B): 165 — ABNORMAL HIGH

## 2011-06-27 NOTE — Progress Notes (Signed)
Pt having vaginal discharge and is on Clomid. Left without being seen

## 2011-07-06 ENCOUNTER — Encounter: Payer: Self-pay | Admitting: Obstetrics and Gynecology

## 2011-07-06 ENCOUNTER — Ambulatory Visit (INDEPENDENT_AMBULATORY_CARE_PROVIDER_SITE_OTHER): Payer: Self-pay | Admitting: Obstetrics and Gynecology

## 2011-07-06 VITALS — BP 131/84 | HR 85 | Temp 98.0°F | Ht 67.0 in | Wt 169.7 lb

## 2011-07-06 DIAGNOSIS — E282 Polycystic ovarian syndrome: Secondary | ICD-10-CM

## 2011-07-06 MED ORDER — METFORMIN HCL 500 MG PO TABS: 500.0000 mg | ORAL_TABLET | Freq: Two times a day (BID) | ORAL | Status: DC

## 2011-07-06 NOTE — Progress Notes (Signed)
Pt declines wanting the flu vaccine

## 2011-07-06 NOTE — Progress Notes (Signed)
The patient is a 30 year old gravida 1 para 0101 was been attempting to get pregnant but has polycystic ovarian syndrome. She had what sounds like an allergic reaction to the Clomid so we've told her not to take any more. We'll going to try her on low-dose metformin 500 twice a day for several months to see whether that will get her ovulating. She is on a temperature chart but she did not bring in she's been complaining of some sharp pains in the lower abdomen it seemed to be coming up from the vagina physical examination at this time was nonrevealing I talked to about mittelschmerz is difficult to know whether or not this is what she is experiencing and she has not had regular periods. She does occasionally have a spontaneous. The pain seemed to come 2 weeks prior to that.

## 2011-07-08 ENCOUNTER — Emergency Department (HOSPITAL_BASED_OUTPATIENT_CLINIC_OR_DEPARTMENT_OTHER)
Admission: EM | Admit: 2011-07-08 | Discharge: 2011-07-08 | Disposition: A | Discharge status: Home / Self Care | Payer: Self-pay | Attending: Emergency Medicine | Admitting: Emergency Medicine

## 2011-07-08 ENCOUNTER — Encounter (HOSPITAL_BASED_OUTPATIENT_CLINIC_OR_DEPARTMENT_OTHER): Payer: Self-pay

## 2011-07-08 DIAGNOSIS — L509 Urticaria, unspecified: Secondary | ICD-10-CM | POA: Insufficient documentation

## 2011-07-08 DIAGNOSIS — Z79899 Other long term (current) drug therapy: Secondary | ICD-10-CM | POA: Insufficient documentation

## 2011-07-08 MED ORDER — PREDNISONE 10 MG PO TABS: 20.0000 mg | ORAL_TABLET | Freq: Every day | ORAL | Status: AC

## 2011-07-08 MED ORDER — PREDNISONE 50 MG PO TABS
60.0000 mg | ORAL_TABLET | Freq: Once | ORAL | Status: AC
  Administered 2011-07-08: 60 mg via ORAL
  Filled 2011-07-08: qty 1

## 2011-07-08 MED ORDER — DIPHENHYDRAMINE HCL 25 MG PO CAPS
ORAL_CAPSULE | ORAL | Status: AC
  Administered 2011-07-08: 50 mg via ORAL
  Filled 2011-07-08: qty 2

## 2011-07-08 MED ORDER — EPINEPHRINE 0.3 MG/0.3ML IJ DEVI: 0.3000 mg | INTRAMUSCULAR | Status: DC | PRN

## 2011-07-08 MED ORDER — DIPHENHYDRAMINE HCL 50 MG PO CAPS
50.0000 mg | ORAL_CAPSULE | Freq: Once | ORAL | Status: AC
  Administered 2011-07-08: 50 mg via ORAL
  Filled 2011-07-08: qty 1

## 2011-07-08 MED ORDER — HYDROXYZINE HCL 25 MG PO TABS: 25.0000 mg | ORAL_TABLET | Freq: Four times a day (QID) | ORAL | Status: AC

## 2011-07-08 MED ORDER — FAMOTIDINE 20 MG PO TABS
40.0000 mg | ORAL_TABLET | Freq: Once | ORAL | Status: AC
  Administered 2011-07-08: 40 mg via ORAL
  Filled 2011-07-08: qty 2

## 2011-07-08 NOTE — ED Provider Notes (Signed)
History     CSN: [id number] Arrival date & time: 07/08/2011  2:37 AM   First MD Initiated Contact with Patient 07/08/11 0252      Chief Complaint  Patient presents with  . Allergic Reaction    (Consider location/radiation/quality/duration/timing/severity/associated sxs/prior treatment) HPI Comments: Pt woke up tonight with rash/itching.  Feels like she is having some swelling to throat/face.  Has had allergic reaction last month as well.  Has not seen an allergist.  No new meds or exposures.  No bites/stings.  Patient is a 30 y.o. female presenting with allergic reaction. The history is provided by the patient.  Allergic Reaction The primary symptoms are  dizziness and rash. The primary symptoms do not include shortness of breath, cough, abdominal pain, nausea, vomiting or diarrhea. The current episode started 1 to 2 hours ago. The problem has been gradually worsening. This is a new problem.  Dizziness does not occur with nausea, vomiting, weakness or diaphoresis.  The rash is associated with itching.  Significant symptoms also include itching. Significant symptoms that are not present include rhinorrhea.    Past Medical History  Diagnosis Date  . Abnormal Pap smear     leep  . Infertility     History reviewed. No pertinent past surgical history.  Family History  Problem Relation Age of Onset  . Hypertension Father   . Cancer Mother     History  Substance Use Topics  . Smoking status: Never Smoker   . Smokeless tobacco: Not on file  . Alcohol Use: No    OB History    Grav Para Term Preterm Abortions TAB SAB Ect Mult Living   1 1  1  0 0 0 0 0 1      Review of Systems  Constitutional: Negative for fever, chills, diaphoresis and fatigue.  HENT: Positive for voice change. Negative for congestion, rhinorrhea, sneezing, drooling and trouble swallowing.   Eyes: Negative.   Respiratory: Negative for cough, chest tightness and shortness of breath.   Cardiovascular:  Negative for chest pain and leg swelling.  Gastrointestinal: Negative for nausea, vomiting, abdominal pain, diarrhea and blood in stool.  Genitourinary: Negative for frequency, hematuria, flank pain and difficulty urinating.  Musculoskeletal: Negative for back pain and arthralgias.  Skin: Positive for itching and rash.  Neurological: Positive for dizziness. Negative for speech difficulty, weakness, numbness and headaches.    Allergies  Septra  Home Medications   Current Outpatient Rx  Name Route Sig Dispense Refill  . ACETAMINOPHEN 500 MG PO TABS Oral Take 1,000 mg by mouth every 6 (six) hours as needed. pain     . CLOMIPHENE CITRATE 50 MG PO TABS Oral Take 50 mg by mouth daily. Take on days 5-9 of cycle     . DIPHENHYDRAMINE HCL (SLEEP) 25 MG PO TABS Oral Take 25 mg by mouth at bedtime as needed.      Marland Kitchen EPINEPHRINE 0.3 MG/0.3ML IJ DEVI Intramuscular Inject 0.3 mLs (0.3 mg total) into the muscle as needed. 2 Device 0  . HYDROXYZINE HCL 25 MG PO TABS Oral Take 1 tablet (25 mg total) by mouth every 6 (six) hours. 12 tablet 0  . MEDROXYPROGESTERONE ACETATE 10 MG PO TABS Oral Take 10 mg by mouth daily. Take on the first 10 days of the month     . METFORMIN HCL 500 MG PO TABS Oral Take 1 tablet (500 mg total) by mouth 2 (two) times daily with a meal. 60 tablet 4    One  tablet 2 times each day with meals  . ONE-DAILY MULTI VITAMINS PO TABS Oral Take 1 tablet by mouth daily.      Marland Kitchen PREDNISONE 10 MG PO TABS Oral Take 2 tablets (20 mg total) by mouth daily. 10 tablet 0    BP 134/82  Pulse 96  Temp(Src) 97.7 F (36.5 C) (Oral)  Resp 17  SpO2 100%  LMP 06/04/2011  Physical Exam  Constitutional: She is oriented to person, place, and time. She appears well-developed and well-nourished.  HENT:  Head: Normocephalic and atraumatic.  Eyes: Pupils are equal, round, and reactive to light.  Neck: Normal range of motion. Neck supple.  Cardiovascular: Normal rate, regular rhythm and normal heart  sounds.   Pulmonary/Chest: Effort normal and breath sounds normal. No respiratory distress. She has no wheezes. She has no rales. She exhibits no tenderness.  Abdominal: Soft. Bowel sounds are normal. There is no tenderness. There is no rebound and no guarding.  Musculoskeletal: Normal range of motion. She exhibits no edema.  Lymphadenopathy:    She has no cervical adenopathy.  Neurological: She is alert and oriented to person, place, and time.  Skin: Skin is warm and dry. Rash noted. No petechiae and no purpura noted. Rash is urticarial. Rash is not pustular and not vesicular.       Diffuse rash  Psychiatric: She has a normal mood and affect.    ED Course  Procedures (including critical care time)  Labs Reviewed - No data to display No results found.   1. Urticaria       MDM   Pt with allergic reaction.  Feeling better now after meds.  No angioedema.  Will have her f/u with her PMD for possible referral to allergist        Rolan Bucco, MD 07/08/11 [phone]

## 2011-07-08 NOTE — ED Notes (Signed)
Pt states that she is breaking out in hives, and states that she feels like her throat is swelling.  Secretions well controlled at this time, voice quality hoarse.

## 2011-10-13 ENCOUNTER — Encounter: Payer: Self-pay | Admitting: Obstetrics and Gynecology

## 2011-10-13 ENCOUNTER — Ambulatory Visit (INDEPENDENT_AMBULATORY_CARE_PROVIDER_SITE_OTHER): Payer: Self-pay | Admitting: Obstetrics and Gynecology

## 2011-10-13 VITALS — BP 112/78 | HR 87 | Temp 98.0°F | Ht 69.0 in | Wt 167.8 lb

## 2011-10-13 DIAGNOSIS — Z01419 Encounter for gynecological examination (general) (routine) without abnormal findings: Secondary | ICD-10-CM

## 2011-10-13 DIAGNOSIS — N898 Other specified noninflammatory disorders of vagina: Secondary | ICD-10-CM

## 2011-10-13 NOTE — Progress Notes (Signed)
Pt needs another Rx for diflucan for yeast infection

## 2011-10-13 NOTE — Progress Notes (Signed)
  Subjective:     Jasmine Castaneda is a 31 y.o. woman who comes in today for a  pap smear. Her most recent annual exam was last year. Her most recent Pap smear was last year and showed no abnormalities. Previous abnormal Pap smears: yes - underwent LEEP with removal of CIN in 2005. Contraception: none and trying for pregnancy, on clomid treatment for PCOS. Denies any breast masses, nipple discharge. Does have a white vaginal DC, no odor, itching or pain.   The following portions of the patient's history were reviewed and updated as appropriate: allergies, current medications, past family history, past medical history, past social history, past surgical history and problem list.  Review of Systems Pertinent items are noted in HPI.   Objective:    BP 112/78  Pulse 87  Temp(Src) 98 F (36.7 C) (Oral)  Ht 5\' 9"  (1.753 m)  Wt 167 lb 12.8 oz (76.114 kg)  BMI 24.78 kg/m2  LMP 09/28/2011 Pelvic Exam: adnexae not palpable, cervix normal in appearance, external genitalia normal, no adnexal masses or tenderness, no cervical motion tenderness and thin white discharge. Pap smear obtained.  WEt prep and GC/ch sent.  Assessment:    Screening pap smear.  Vaginal discharge. Screening GC/Ch.  Plan:    Follow up in 1 year, or as indicated by Pap results.  Advised to F/u with Dr. Okey Dupre to discuss infertility.  Will be called regarding wet prep results.

## 2011-10-13 NOTE — Patient Instructions (Signed)
Nice meeting you. WIll call or send letter regarding results. Make an appointment with Dr. Okey Dupre for infertility. Follow up in one year for physical.

## 2011-10-14 LAB — WET PREP, GENITAL
Clue Cells Wet Prep HPF POC: NONE SEEN
WBC, Wet Prep HPF POC: NONE SEEN

## 2011-11-10 ENCOUNTER — Ambulatory Visit (INDEPENDENT_AMBULATORY_CARE_PROVIDER_SITE_OTHER): Payer: Self-pay | Admitting: Family

## 2011-11-10 ENCOUNTER — Encounter: Payer: Self-pay | Admitting: Family

## 2011-11-10 VITALS — BP 130/76 | HR 84 | Temp 97.5°F | Ht 69.0 in | Wt 169.6 lb

## 2011-11-10 DIAGNOSIS — N949 Unspecified condition associated with female genital organs and menstrual cycle: Secondary | ICD-10-CM

## 2011-11-10 DIAGNOSIS — R102 Pelvic and perineal pain: Secondary | ICD-10-CM

## 2011-11-10 MED ORDER — CLOMIPHENE CITRATE 50 MG PO TABS: 100.0000 mg | ORAL_TABLET | Freq: Every day | ORAL | Status: DC

## 2011-11-10 NOTE — Patient Instructions (Signed)
Polycystic Ovarian Syndrome Polycystic ovarian syndrome is a condition with a number of problems. One problem is with the ovaries. The ovaries are organs located in the female pelvis, on each side of the uterus. Usually, during the menstrual cycle, an egg is released from 1 ovary every month. This is called ovulation. When the egg is fertilized, it goes into the womb (uterus), which allows for the growth of a baby. The egg travels from the ovary through the fallopian tube to the uterus. The ovaries also make the hormones estrogen and progesterone. These hormones help the development of a woman's breasts, body shape, and body hair. They also regulate the menstrual cycle and pregnancy. Sometimes, cysts form in the ovaries. A cyst is a fluid-filled sac. On the ovary, different types of cysts can form. The most common type of ovarian cyst is called a functional or ovulation cyst. It is normal, and often forms during the normal menstrual cycle. Each month, a woman's ovaries grow tiny cysts that hold the eggs. When an egg is fully grown, the sac breaks open. This releases the egg. Then, the sac which released the egg from the ovary dissolves. In one type of functional cyst, called a follicle cyst, the sac does not break open to release the egg. It may actually continue to grow. This type of cyst usually disappears within 1 to 3 months.  One type of cyst problem with the ovaries is called Polycystic Ovarian Syndrome (PCOS). In this condition, many follicle cysts form, but do not rupture and produce an egg. This health problem can affect the following:  Menstrual cycle.   Heart.   Obesity.   Cancer of the uterus.   Fertility.   Blood vessels.   Hair growth (face and body) or baldness.   Hormones.   Appearance.   High blood pressure.   Stroke.   Insulin production.   Inflammation of the liver.   Elevated blood cholesterol and triglycerides.  CAUSES   No one knows the exact cause of PCOS.    Women with PCOS often have a mother or sister with PCOS. There is not yet enough proof to say this is inherited.   Many women with PCOS have a weight problem.   Researchers are looking at the relationship between PCOS and the body's ability to make insulin. Insulin is a hormone that regulates the change of sugar, starches, and other food into energy for the body's use, or for storage. Some women with PCOS make too much insulin. It is possible that the ovaries react by making too many female hormones, called androgens. This can lead to acne, excessive hair growth, weight gain, and ovulation problems.   Too much production of luteinizing hormone (LH) from the pituitary gland in the brain stimulates the ovary to produce too much female hormone (androgen).  SYMPTOMS   Infrequent or no menstrual periods, and/or irregular bleeding.   Inability to get pregnant (infertility), because of not ovulating.   Increased growth of hair on the face, chest, stomach, back, thumbs, thighs, or toes.   Acne, oily skin, or dandruff.   Pelvic pain.   Weight gain or obesity, usually carrying extra weight around the waist.   Type 2 diabetes (this is the diabetes that usually does not need insulin).   High cholesterol.   High blood pressure.   Female-pattern baldness or thinning hair.   Patches of thickened and dark brown or black skin on the neck, arms, breasts, or thighs.   Skin tags,   or tiny excess flaps of skin, in the armpits or neck area.   Sleep apnea (excessive snoring and breathing stops at times while asleep).   Deepening of the voice.   Gestational diabetes when pregnant.   Increased risk of miscarriage with pregnancy.  DIAGNOSIS  There is no single test to diagnose PCOS.   Your caregiver will:   Take a medical history.   Perform a pelvic exam.   Perform an ultrasound.   Check your female and female hormone levels.   Measure glucose or sugar levels in the blood.   Do other blood  tests.   If you are producing too many female hormones, your caregiver will make sure it is from PCOS. At the physical exam, your caregiver will want to evaluate the areas of increased hair growth. Try to allow natural hair growth for a few days before the visit.   During a pelvic exam, the ovaries may be enlarged or swollen by the increased number of small cysts. This can be seen more easily by vaginal ultrasound or screening, to examine the ovaries and lining of the uterus (endometrium) for cysts. The uterine lining may become thicker, if there has not been a regular period.  TREATMENT  Because there is no cure for PCOS, it needs to be managed to prevent problems. Treatments are based on your symptoms. Treatment is also based on whether you want to have a baby or whether you need contraception.  Treatment may include:  Progesterone hormone, to start a menstrual period.   Birth control pills, to make you have regular menstrual periods.   Medicines to make you ovulate, if you want to get pregnant.   Medicines to control your insulin.   Medicine to control your blood pressure.   Medicine and diet, to control your high cholesterol and triglycerides in your blood.   Surgery, making small holes in the ovary, to decrease the amount of female hormone production. This is done through a long, lighted tube (laparoscope), placed into the pelvis through a tiny incision in the lower abdomen.  Your caregiver will go over some of the choices with you. WOMEN WITH PCOS HAVE THESE CHARACTERISTICS:  High levels of female hormones called androgens.   An irregular or no menstrual cycle.   May have many small cysts in their ovaries.  PCOS is the most common hormonal reproductive problem in women of childbearing age. WHY DO WOMEN WITH PCOS HAVE TROUBLE WITH THEIR MENSTRUAL CYCLE? Each month, about 20 eggs start to mature in the ovaries. As one egg grows and matures, the follicle breaks open to release the egg,  so it can travel through the fallopian tube for fertilization. When the single egg leaves the follicle, ovulation takes place. In women with PCOS, the ovary does not make all of the hormones it needs for any of the eggs to fully mature. They may start to grow and accumulate fluid, but no one egg becomes large enough. Instead, some may remain as cysts. Since no egg matures or is released, ovulation does not occur and the hormone progesterone is not made. Without progesterone, a woman's menstrual cycle is irregular or absent. Also, the cysts produce female hormones, which continue to prevent ovulation.  Document Released: 12/22/2004 Document Revised: 05/10/2011 Document Reviewed: 07/16/2009 [employer] Patient Information 2012 [employer].   one egg becomes large enough. Instead, some may remain as cysts. Since no egg matures or is released, ovulation does not occur and the hormone progesterone is not made. Without progesterone, a woman's menstrual cycle is irregular or absent. Also, the cysts produce female hormones, which continue to prevent ovulation.   Document Released: 12/22/2004 Document Revised: 05/10/2011 Document Reviewed: 07/16/2009 [facility] Patient Information 2012 [place], [place].

## 2011-11-10 NOTE — Progress Notes (Signed)
  Subjective:    Patient ID: Jasmine Castaneda, female    DOB: 1980-11-04, 31 y.o.   MRN: [id number]  HPI Pt is here for frequent pelvic pain.  Pain is in midpelvic area and described as sharp.  LMP 11/06/11.  Pt reports not taking metformin or clomid due wanting to know what's going on first.  Desires pregnancy at this time.     Review of Systems  Genitourinary: Positive for pelvic pain.  All other systems reviewed and are negative.       Objective:   Physical Exam  Constitutional: She is oriented to person, place, and time. She appears well-developed and well-nourished. No distress.  HENT:  Head: Normocephalic.  Neck: Normal range of motion. Neck supple.  Abdominal: Soft.  Genitourinary: Uterus is tender (midpelvic). Cervix exhibits no motion tenderness, no discharge and no friability. Right adnexum displays no mass and no tenderness. Left adnexum displays no mass and no tenderness. No vaginal discharge found.  Neurological: She is alert and oriented to person, place, and time.  Skin: Skin is warm and dry.          Assessment & Plan:  Pelvic Pain PCOS  Plan: Pelvic ultrasound Metformin 500 mg BID Increase clomid to 100 mg day 5-9 of cycle F/u in 4 weeks. [facility]

## 2011-11-17 ENCOUNTER — Ambulatory Visit (HOSPITAL_COMMUNITY)
Admission: RE | Admit: 2011-11-17 | Discharge: 2011-11-17 | Disposition: A | Discharge status: Home / Self Care | Payer: Self-pay | Source: Ambulatory Visit | Attending: Family | Admitting: Family

## 2011-11-17 DIAGNOSIS — N838 Other noninflammatory disorders of ovary, fallopian tube and broad ligament: Secondary | ICD-10-CM | POA: Insufficient documentation

## 2011-11-17 DIAGNOSIS — R102 Pelvic and perineal pain: Secondary | ICD-10-CM

## 2011-11-17 DIAGNOSIS — N949 Unspecified condition associated with female genital organs and menstrual cycle: Secondary | ICD-10-CM | POA: Insufficient documentation

## 2011-12-14 ENCOUNTER — Ambulatory Visit: Payer: Self-pay | Admitting: Advanced Practice Midwife

## 2012-01-04 ENCOUNTER — Ambulatory Visit: Payer: Self-pay | Admitting: Advanced Practice Midwife

## 2012-01-24 ENCOUNTER — Encounter (HOSPITAL_BASED_OUTPATIENT_CLINIC_OR_DEPARTMENT_OTHER): Payer: Self-pay | Admitting: *Deleted

## 2012-01-24 ENCOUNTER — Emergency Department (HOSPITAL_BASED_OUTPATIENT_CLINIC_OR_DEPARTMENT_OTHER)
Admission: EM | Admit: 2012-01-24 | Discharge: 2012-01-24 | Disposition: A | Discharge status: Home / Self Care | Payer: Self-pay | Attending: Emergency Medicine | Admitting: Emergency Medicine

## 2012-01-24 DIAGNOSIS — H5789 Other specified disorders of eye and adnexa: Secondary | ICD-10-CM | POA: Insufficient documentation

## 2012-01-24 DIAGNOSIS — J329 Chronic sinusitis, unspecified: Secondary | ICD-10-CM | POA: Insufficient documentation

## 2012-01-24 MED ORDER — AMOXICILLIN 500 MG PO CAPS: 500.0000 mg | ORAL_CAPSULE | Freq: Three times a day (TID) | ORAL | Status: AC

## 2012-01-24 NOTE — Discharge Instructions (Signed)
Sinusitis Sinusitis an infection of the air pockets (sinuses) in your face. This can cause puffiness (swelling). It can also cause drainage from your sinuses.   HOME CARE    Only take medicine as told by your doctor.   Drink enough fluids to keep your pee (urine) clear or pale yellow.   Apply moist heat or ice packs for pain relief.   Use salt (saline) nose sprays. The spray will wet the thick fluid in the nose. This can help the sinuses drain.  GET HELP RIGHT AWAY IF:    You have a fever.   Your baby is older than 3 months with a rectal temperature of 102 F (38.9 C) or higher.   Your baby is 3 months old or younger with a rectal temperature of 100.4 F (38 C) or higher.   The pain gets worse.   You get a very bad headache.   You keep throwing up (vomiting).   Your face gets puffy.  MAKE SURE YOU:    Understand these instructions.   Will watch your condition.   Will get help right away if you are not doing well or get worse.  Document Released: 02/14/2008 Document Revised: 08/17/2011 Document Reviewed: 02/14/2008 [employer] Patient Information 2012 [employer]. 

## 2012-01-24 NOTE — ED Provider Notes (Signed)
Medical screening examination/treatment/procedure(s) were performed by non-physician practitioner and as supervising physician I was immediately available for consultation/collaboration.   Camber Ninh B. Bernette Mayers, MD 01/24/12 1610

## 2012-01-24 NOTE — ED Notes (Signed)
Pt c/o eye pain and drainage x 2 days

## 2012-01-24 NOTE — ED Provider Notes (Signed)
History     CSN: [id number]  Arrival date & time 01/24/12  1629   First MD Initiated Contact with Patient 01/24/12 1642      Chief Complaint  Patient presents with  . Eye Drainage    (Consider location/radiation/quality/duration/timing/severity/associated sxs/prior treatment) HPI Comments: Pt states that she woke up this morning with eye drainage to both eyes and a lot of facial pain:pt denies any fever:pt states that she has a history of seasonal allergies  The history is provided by the patient. No language interpreter was used.    Past Medical History  Diagnosis Date  . Abnormal Pap smear     leep  . Infertility   . PCOS (polycystic ovarian syndrome)     Past Surgical History  Procedure Date  . Leep     Family History  Problem Relation Age of Onset  . Hypertension Father   . Cancer Mother     History  Substance Use Topics  . Smoking status: Never Smoker   . Smokeless tobacco: Not on file  . Alcohol Use: No     social drinker    OB History    Grav Para Term Preterm Abortions TAB SAB Ect Mult Living   1 1  1  0 0 0 0 0 1      Review of Systems  Constitutional: Negative.   Respiratory: Negative.   Cardiovascular: Negative.     Allergies  Septra  Home Medications   Current Outpatient Rx  Name Route Sig Dispense Refill  . ACETAMINOPHEN 500 MG PO TABS Oral Take 1,000 mg by mouth every 6 (six) hours as needed. pain     . CLOMIPHENE CITRATE 50 MG PO TABS Oral Take 2 tablets (100 mg total) by mouth daily. Take on days 5-9 of cycle 20 tablet 1  . DIPHENHYDRAMINE HCL (SLEEP) 25 MG PO TABS Oral Take 25 mg by mouth at bedtime as needed.      Marland Kitchen EPINEPHRINE 0.3 MG/0.3ML IJ DEVI Intramuscular Inject 0.3 mLs (0.3 mg total) into the muscle as needed. 2 Device 0  . DIFLUCAN PO Oral Take by mouth.    . MEDROXYPROGESTERONE ACETATE 10 MG PO TABS Oral Take 10 mg by mouth daily. Take on the first 10 days of the month     . METFORMIN HCL 500 MG PO TABS Oral Take 1  tablet (500 mg total) by mouth 2 (two) times daily with a meal. 60 tablet 4    One tablet 2 times each day with meals  . ONE-DAILY MULTI VITAMINS PO TABS Oral Take 1 tablet by mouth daily.        BP 120/78  Pulse 79  Temp 98.5 F (36.9 C)  Resp 16  Ht 5\' 9"  (1.753 m)  Wt 160 lb (72.576 kg)  BMI 23.63 kg/m2  SpO2 100%  Physical Exam  Nursing note and vitals reviewed. Constitutional: She is oriented to person, place, and time.  HENT:  Right Ear: External ear normal.  Left Ear: External ear normal.  Nose: Mucosal edema present. Right sinus exhibits frontal sinus tenderness. Left sinus exhibits frontal sinus tenderness.  Eyes: Conjunctivae and EOM are normal. Pupils are equal, round, and reactive to light.  Cardiovascular: Normal rate and regular rhythm.   Pulmonary/Chest: Effort normal and breath sounds normal.  Musculoskeletal: Normal range of motion.  Neurological: She is oriented to person, place, and time.  Skin: Skin is warm and dry.    ED Course  Procedures (including critical care time)  Labs Reviewed - No data to display No results found.   1. Sinusitis       MDM  Will treat for sinusitis        Teressa Lower, NP 01/24/12 1657

## 2012-02-11 IMAGING — CT CT ABD-PELV W/ CM
1 of 2 series · 15 of 32 positions shown, 19 images · IV contrast (OMNIPAQUE)
Comparison: Concurrent pelvic ultrasound

CLINICAL DATA: Right lower quadrant pain for several months.

CT ABDOMEN AND PELVIS WITH CONTRAST
TECHNIQUE: Multidetector CT imaging of the abdomen and pelvis was
performed following the standard protocol during bolus
administration of intravenous contrast.
Contrast: 100 ml of Omnipaque 300% and oral contrast

[Series 2: routine abdomen/pelvis with · axial · 0.64mm/px · z∈[-421,-66]mm · 15 of 80 slices shown, 19 images]
[im 6/80  soft-tissue]
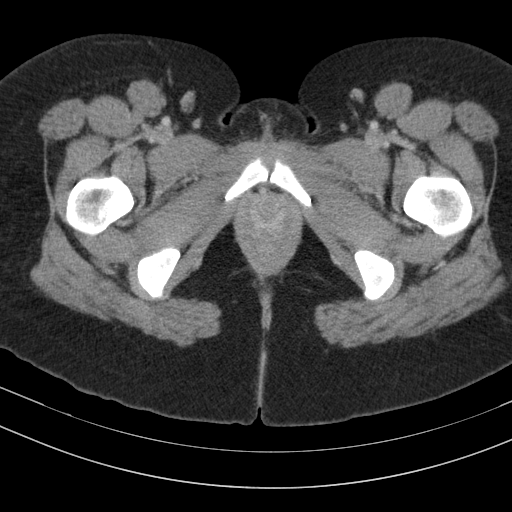
[im 6/80  bone]
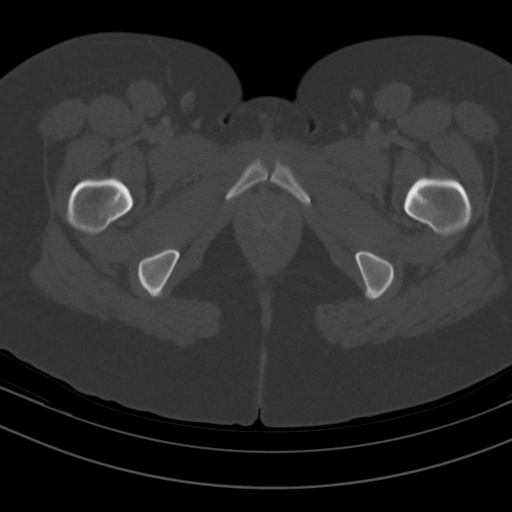
[im 12/80  soft-tissue]
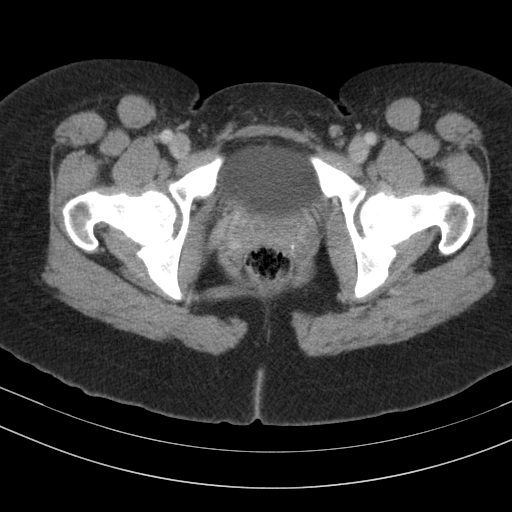
[im 18/80  soft-tissue]
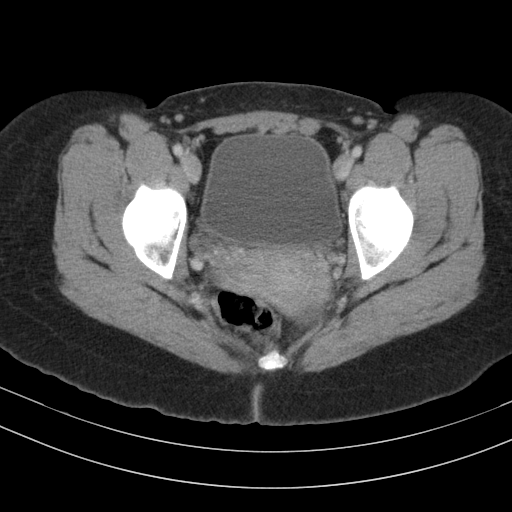
[im 24/80  soft-tissue]
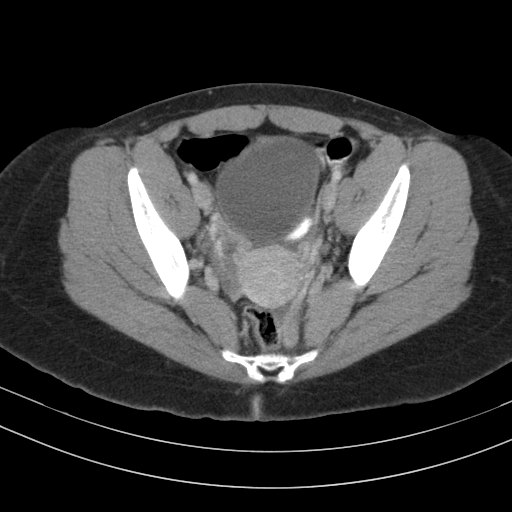
[im 30/80  soft-tissue]
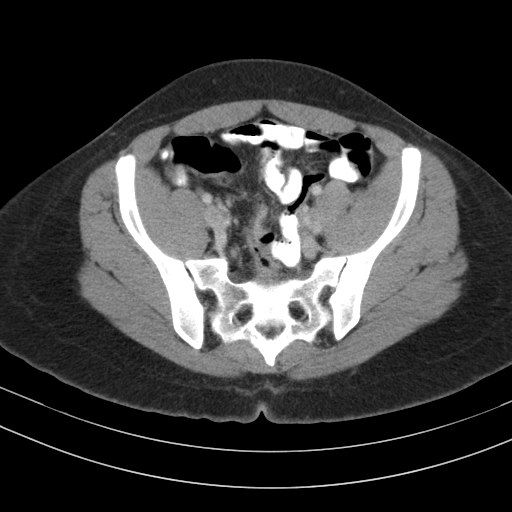
[im 36/80  soft-tissue]
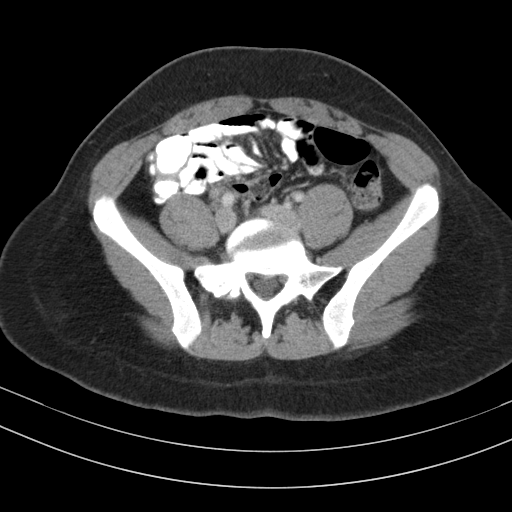
[im 41/80  soft-tissue]
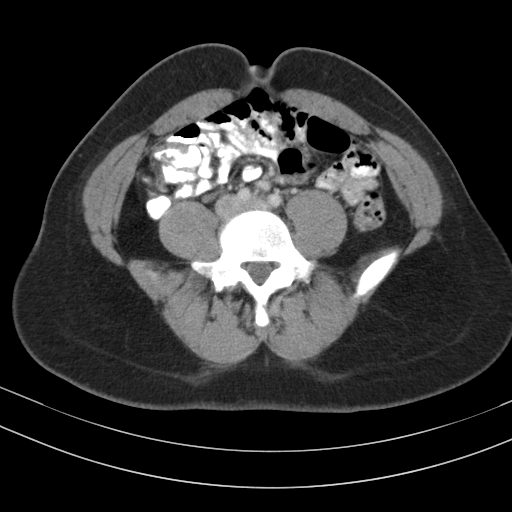
[im 47/80  soft-tissue]
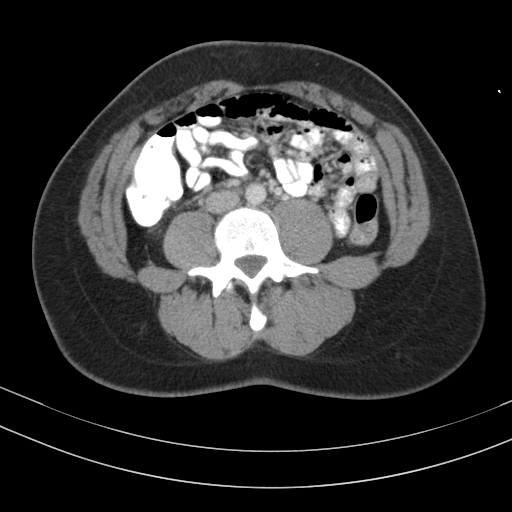
[im 53/80  soft-tissue]
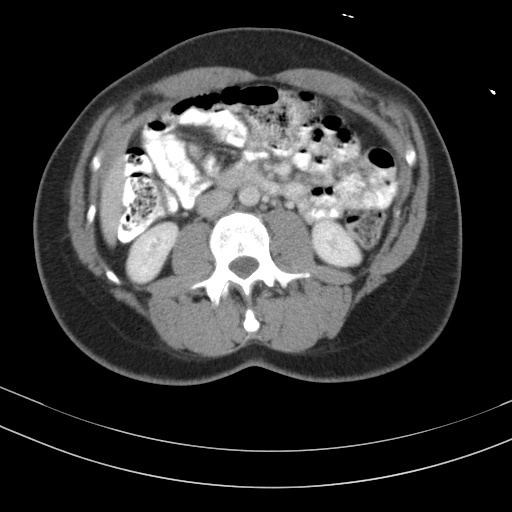
[im 53/80  bone]
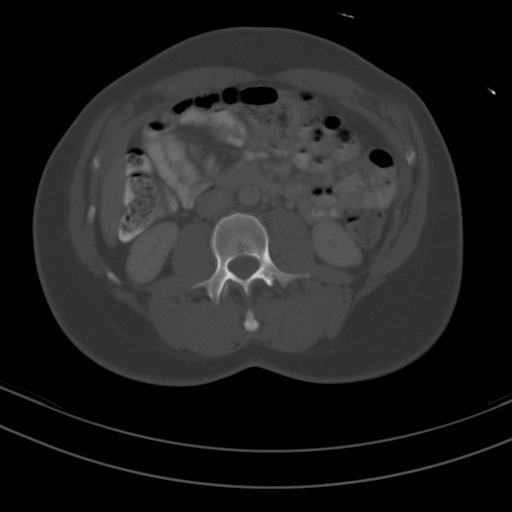
[im 59/80  soft-tissue]
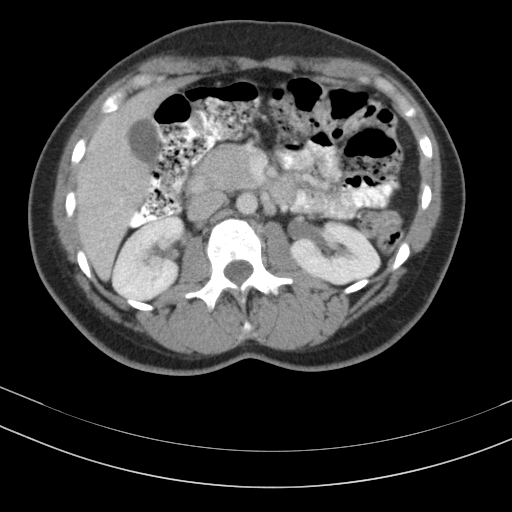
[im 65/80  soft-tissue]
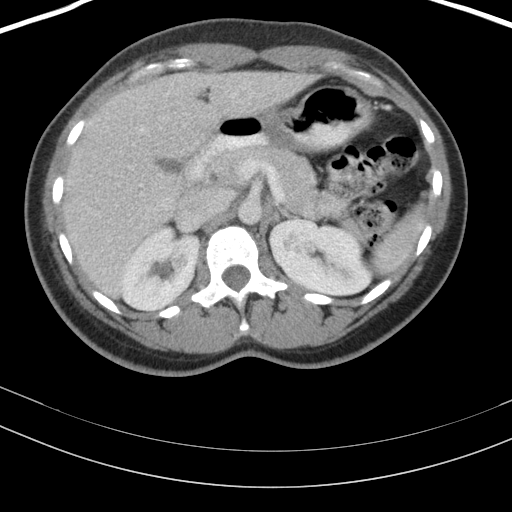
[im 68/80  lung]
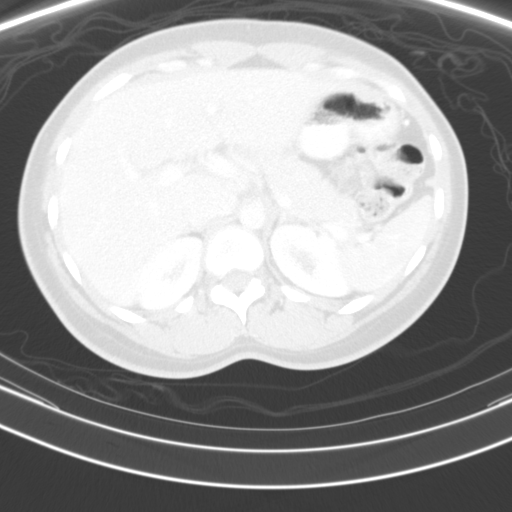
[im 71/80  soft-tissue]
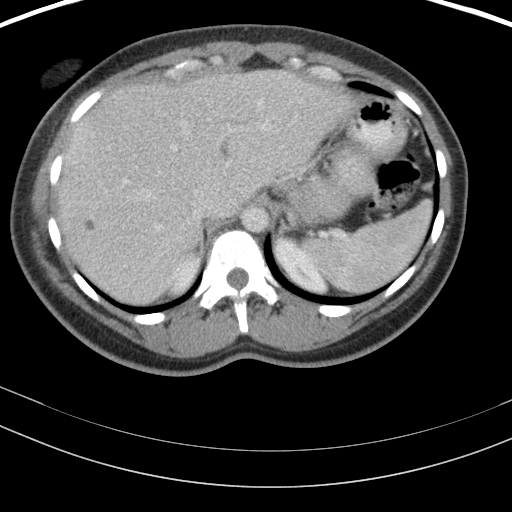
[im 71/80  lung]
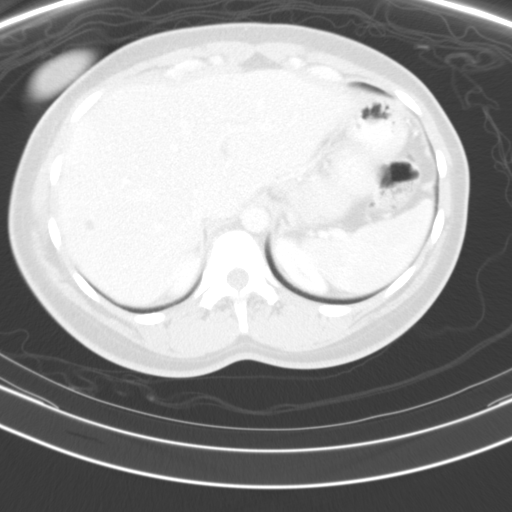
[im 74/80  lung]
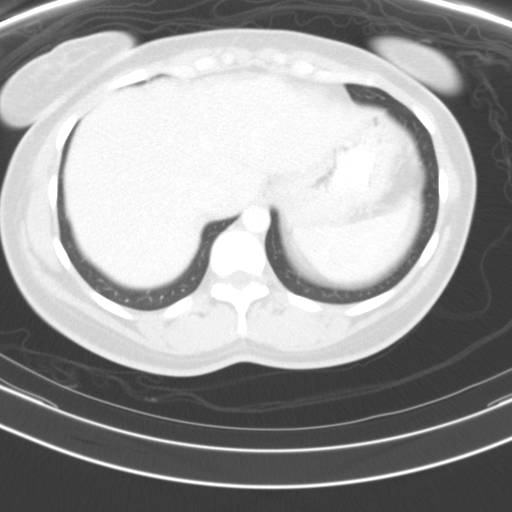
[im 77/80  soft-tissue]
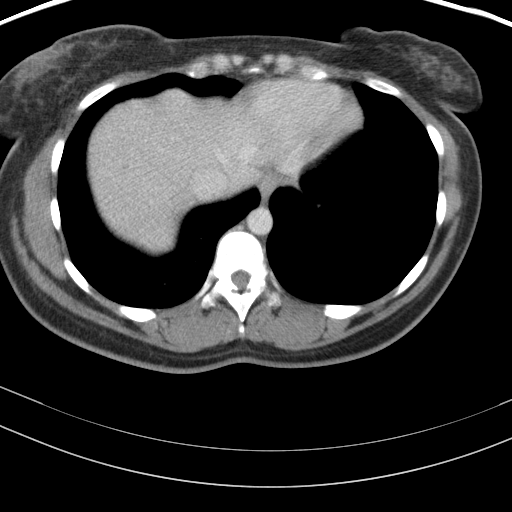
[im 77/80  lung]
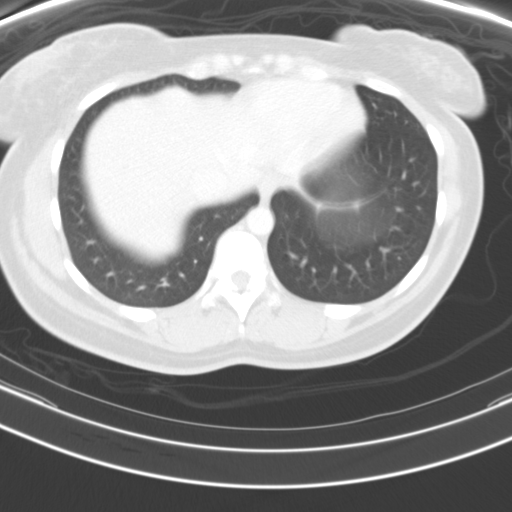

[15 of 32 positions shown; findings below may reference images not displayed]

FINDINGS: The lung bases appear clear.

There are two sub centimeter low density areas identified within
the right lobe of the liver which are too small to characterize and
likely represent small volume-averaged cysts.  The liver is
otherwise unremarkable.  The gallbladder, pancreas, adrenal glands,
kidneys, and spleen have a normal post contrasted appearance.

The stomach was incompletely distended and poorly assessed.
Adequate bowel contrast was otherwise achieved.  No focal bowel
abnormalities are identified.  The ileocecal valve and appendix
have a normal appearance.

The uterus has a normal appearance.  Both ovaries appear prominent
and this would correlate with the sonographic finding of polycystic
ovaries.  A small amount of fluid is seen in the pelvis also
correlating with the ultrasound finding.  No intra-abdominal fluid
is seen.  The bladder appears normal.

Vascular structures are within normal limits.  Bony structures are
notable for mild curvature of the lumbar spine. No worrisome bone
lesions are identified.  No intra-abdominal or pelvic inflammatory
change is seen.  No pathologic adenopathy is noted.

Extraabdominal soft tissue planes show a small fat containing
umbilical hernia and are otherwise intact.
IMPRESSION: Prominent ovarian size bilaterally compatible with the sonographic
finding of polycystic ovaries.

Small fat containing umbilical hernia. Otherwise unremarkable
abdominal pelvic CT with no explanation for the patient's right
lower quadrant noted.

## 2012-03-11 ENCOUNTER — Telehealth: Payer: Self-pay | Admitting: *Deleted

## 2012-03-11 ENCOUNTER — Ambulatory Visit: Payer: Self-pay | Admitting: Obstetrics & Gynecology

## 2012-03-11 MED ORDER — FLUCONAZOLE 150 MG PO TABS: 150.0000 mg | ORAL_TABLET | Freq: Once | ORAL | Status: AC

## 2012-03-11 NOTE — Telephone Encounter (Signed)
Pt called and informed me that she needed to have a refill of diflucan.  She was seeing Dr.Rose in which she was prescribed diflucan for refills due to getting recurrent yeast infections.  Speaking with Maylon Cos, CNM pt can have 1 refill for Diflucan and if you symptoms persist then she will need to call to schedule an appt for her discharge.  Pt stated understanding and had no further questions.

## 2012-03-11 NOTE — Telephone Encounter (Signed)
Pt would like to get a refill on her Diflucan. She was recently in to see [place] but wasn't given the prescription. Dr. Okey Dupre previously prescribed it to her with refills for a year.

## 2012-03-13 ENCOUNTER — Ambulatory Visit (INDEPENDENT_AMBULATORY_CARE_PROVIDER_SITE_OTHER): Payer: Self-pay | Admitting: Obstetrics & Gynecology

## 2012-03-13 ENCOUNTER — Encounter: Payer: Self-pay | Admitting: Obstetrics & Gynecology

## 2012-03-13 VITALS — BP 127/88 | HR 84 | Ht 69.0 in | Wt 158.5 lb

## 2012-03-13 DIAGNOSIS — E282 Polycystic ovarian syndrome: Secondary | ICD-10-CM

## 2012-03-13 DIAGNOSIS — B9689 Other specified bacterial agents as the cause of diseases classified elsewhere: Secondary | ICD-10-CM

## 2012-03-13 DIAGNOSIS — N760 Acute vaginitis: Secondary | ICD-10-CM

## 2012-03-13 DIAGNOSIS — A499 Bacterial infection, unspecified: Secondary | ICD-10-CM

## 2012-03-13 LAB — WET PREP, GENITAL

## 2012-03-13 NOTE — Patient Instructions (Signed)
Vaginitis Vaginitis is an infection. It causes soreness, swelling, and redness (inflammation) of the vagina. Many of these infections are sexually transmitted diseases (STDs). Having unprotected sex can cause further problems and complications such as:  Chronic pelvic pain.   Infertility.   Unwanted pregnancy.   Abortion.   Tubal pregnancy.   Infection passed on to the newborn.   Cancer.  CAUSES   Monilia. This is a yeast or fungus infection, not an STD.   Bacterial vaginosis. The normal balance of bacteria in the vagina is disrupted and is replaced by an overgrowth of certain bacteria.   Gonorrhea, chlamydia. These are bacterial infections that are STDs.   Vaginal sponges, diaphragms, and intrauterine devices.   Trichomoniasis. This is a STD infection caused by a parasite.   Viruses like herpes and human papillomavirus. Both are STDs.   Pregnancy.   Immunosuppression. This occurs with certain conditions such as HIV infection or cancer.   Using bubble bath.   Taking certain antibiotic medicines.   Sporadic recurrence can occur if you become sick.   Diabetes.   Steroids.   Allergic reaction. If you have an allergy to:   Douches.   Soaps.   Spermicides.   Condoms.   Scented tampons or vaginal sprays.  SYMPTOMS   Abnormal vaginal discharge.   Itching of the vagina.   Pain in the vagina.   Swelling of the vagina.  In some cases, there are no symptoms. TREATMENT  Treatment will vary depending on the type of infection.  Bacteria or trichomonas are usually treated with oral antibiotics and sometimes vaginal cream or suppositories.   Monilia vaginitis is usually treated with vaginal creams, suppositories, or oral antifungal pills.   Viral vaginitis has no cure. However, the symptoms of herpes (a viral vaginitis) can be treated to relieve the discomfort. Human papillomavirus has no symptoms. However, there are treatments for the diseases caused by human  papillomavirus.   With allergic vaginitis, you need to stop using the product that is causing the problem. Vaginal creams can be used to treat the symptoms.   When treating an STD, the sex partner should also be treated.  HOME CARE INSTRUCTIONS   Take all the medicines as directed by your caregiver.   Do not use scented tampons, soaps, or vaginal sprays.   Do not douche.   Tell your sex partner if you have a vaginal infection or an STD.   Do not have sexual intercourse until you have treated the vaginitis.   Practice safe sex by using condoms.  SEEK MEDICAL CARE IF:   You have abdominal pain.   Your symptoms get worse during treatment.  Document Released: 06/25/2007 Document Revised: 08/17/2011 Document Reviewed: 02/18/2009 [employer] Patient Information 2012 [employer]. 

## 2012-03-13 NOTE — Progress Notes (Signed)
History:  31 y.o. G1P0101 here today for evaluation of vaginitis x 2 days.  Reports thick, yellow-white discharge discharge that is malodorous and irritating. No dysuria, nausea or vomiting or other systemic symptoms.  The following portions of the patient's history were reviewed and updated as appropriate: allergies, current medications, past family history, past medical history, past social history, past surgical history and problem list. Last pap in 10/2011 was normal.  Review of Systems:  Pertinent items are noted in HPI.  Objective:  Physical Exam (done by Dr. Rodman Pickle) Blood pressure 127/88, pulse 84, height 5\' 9"  (1.753 m), weight 158 lb 8 oz (71.895 kg). Gen: NAD Abd: Soft, nontender and nondistended Pelvic: Normal appearing external genitalia; normal appearing vaginal mucosa and cervix.  Thin yellow malodorous discharge, wet prep obtained  Small uterus, no other palpable masses, no uterine or adnexal tenderness  Assessment & Plan:  Wet prep obtained.   Follow up results and manage accordingly.

## 2012-03-15 MED ORDER — METRONIDAZOLE 500 MG PO TABS: 500.0000 mg | ORAL_TABLET | Freq: Two times a day (BID) | ORAL | Status: AC

## 2012-03-15 NOTE — Progress Notes (Signed)
Wet prep showed clue cells, Metronidazole e-prescribed. Patient called to inform her of diagnosis and treatment, and to go pick up the prescription. She was counseled about antabuse reaction that can happen with alcohol and metronidazole. She was also told to call the clinic with any further questions or further concerns.

## 2012-03-15 NOTE — Addendum Note (Signed)
Addended by: Jastin Fore A on: 03/15/2012 08:54 AM   Modules accepted: Orders  

## 2012-03-18 ENCOUNTER — Telehealth: Payer: Self-pay | Admitting: *Deleted

## 2012-03-18 DIAGNOSIS — B379 Candidiasis, unspecified: Secondary | ICD-10-CM

## 2012-03-18 MED ORDER — FLUCONAZOLE 150 MG PO TABS: 150.0000 mg | ORAL_TABLET | Freq: Once | ORAL | Status: AC

## 2012-03-18 NOTE — Telephone Encounter (Signed)
Discussed with Dr. Macon Large - may order diflucan , sent to pharmacy and called patient and left a message rx she requested sent to pharmacy

## 2012-03-18 NOTE — Telephone Encounter (Signed)
Jasmine Castaneda called and said she received a voicemail that results were back and a prescription was called in . States " I just want to be sure they called in diflucan too, because the antibiotic will give me a yeast infection.   Called [name] to clarify- per chart review diflucan was called in 03/11/12. [name] states she took that one, but then saw doctor 03/13/12 and was prescribed metronidazole and wants diflucan called in again to take when finished metronidazole.  Informed patient would check and call her back.

## 2012-04-12 ENCOUNTER — Encounter: Payer: Self-pay | Admitting: Advanced Practice Midwife

## 2012-04-12 ENCOUNTER — Ambulatory Visit (INDEPENDENT_AMBULATORY_CARE_PROVIDER_SITE_OTHER): Payer: Self-pay | Admitting: Advanced Practice Midwife

## 2012-04-12 VITALS — BP 122/82 | HR 69 | Temp 97.4°F | Ht 66.0 in | Wt 156.1 lb

## 2012-04-12 DIAGNOSIS — A499 Bacterial infection, unspecified: Secondary | ICD-10-CM

## 2012-04-12 DIAGNOSIS — N760 Acute vaginitis: Secondary | ICD-10-CM

## 2012-04-12 MED ORDER — REPHRESH VA GEL: 1.0000 | VAGINAL | Status: DC

## 2012-04-12 MED ORDER — METRONIDAZOLE 500 MG PO TABS: 2000.0000 mg | ORAL_TABLET | Freq: Once | ORAL | Status: AC

## 2012-04-12 MED ORDER — FLUCONAZOLE 150 MG PO TABS: 150.0000 mg | ORAL_TABLET | Freq: Once | ORAL | Status: AC

## 2012-04-12 NOTE — Patient Instructions (Signed)
Menstruation Menstruation is the monthly passing of blood, tissue, fluid and mucus, also know as a period. Your body is shedding the lining of the uterus. The flow, or amount of blood, usually lasts from 3 to 7 days each month. Hormones control the menstrual cycle. Hormones are a chemical substance produced by endocrine glands in the body to regulate different bodily functions. The first menstrual period may start any time between age 29 to 28 years. However, it usually starts around age 41 or 9. Some girls have regular monthly menstrual cycles right from the beginning. However, it is not unusual to have only a couple of drops of blood or spotting when you first start menstruating. It is also not unusual to have two periods a month or miss a month or two when first starting your periods. SYMPTOMS   Mild to moderate abdominal cramps.   Aching or pain in the lower back area.  Symptoms that may occur 5 to 10 days before your menstrual period starts, which is referred to as premenstrual syndrome (PMS). These symptoms can include:  Headache.   Breast tenderness and swelling.   Bloating.   Tiredness (fatigue).   Mood changes.   Craving for certain foods.  These are normal signs and symptoms and can vary in severity. To help relieve these problems, ask your caregiver if you can take over-the-counter medications for pain or discomfort. If the symptoms are not controllable, see your caregiver for help.  HORMONES INVOLVED IN MENSTRUATION Menstruation comes about because of hormones produced by the pituitary gland in the brain and the ovaries that affect the uterine lining. First, the pituitary gland in the brain produces the hormone Follicle Stimulating Hormone [facility]). FSH stimulates the ovaries to produce estrogen, which thickens the uterine lining and begins to develop an egg in the ovary. About 14 days later, the pituitary gland produces another hormone called Luteinizing Hormone (LH). LH causes the  egg to come out of a sac in the ovary (ovulation). The empty sac on the ovary called the corpus luteum is stimulated by another hormone from the pituitary gland called luteotropin. The corpus luteum begins to produce the estrogen and progesterone hormone. The progesterone hormone prepares the lining of the uterus to have the fertilized egg (egg and sperm) attach to the lining of the uterus and begin to develop into a fetus. If the egg is not fertilized, the corpus luteum stops producing estrogen and progesterone, it disappears, the lining of the uterus sloughs off and a menstrual period begins. Then the menstrual cycle starts all over again and will continue monthly unless pregnancy occurs or menopause begins. The secretion of hormones is complex. Various parts of the body become involved in many chemical activities. Female sex hormones have other functions in a woman's body as well. Estrogen increases a woman's sex drive (libido). It naturally helps body get rid of fluids (diuretic). It also aids in the process of building new bone. Therefore, maintaining hormonal health is essential to all levels of a woman's well being. These hormones are usually present in normal amounts and cause you to menstruate. It is the relationship between the (small) levels of the hormones that is critical. When the balance is upset, menstrual irregularities can occur. HOW DOES THE MENSTRUAL CYCLE HAPPEN?  Menstrual cycles vary in length from 21 to 35 days with an average of 29 days. The cycle begins on the first day of bleeding. At this time, the pituitary gland in the brain releases [facility] that travels  through the bloodstream to the ovaries. [facility] stimulates the follicles in the ovaries. This prepares the body for ovulation that occurs around the 14th day of the cycle. The ovaries produce estrogen, and this makes sure conditions are right in the uterus for implantation of the fertilized egg.   When the levels of estrogen reach a  high enough level, it signals the gland in the brain (pituitary gland) to release a surge of LH. This causes the release of the ripest egg from its follicle (ovulation). Usually only one follicle releases one egg, but sometimes more than one follicle releases an egg especially when stimulating the ovaries for invitro fertilization. The egg can then be collected by either fallopian tube to await fertilization. The burst follicle within the ovary that is left behind is now called the corpus luteum or "yellow body." The corpus luteum continues to give off (secrete) reduced amounts of estrogen. This closes and hardens the cervix. It driesup the mucus to the naturally infertile condition.   The corpus luteum also begins to give off greater amounts of progesterone. This causes the lining of the uterus (endometrium) to thicken even more in preparation for the fertilized egg. The egg is starting to journey down from the fallopian tube to the uterus. It also signals the ovaries to stop releasing eggs. It assists in returning the cervical mucus to its infertile state.   If the egg implants successfully into the womb lining and pregnancy occurs, progesterone levels will continue to raise. It is often this hormone that gives some pregnant women a feeling of well being, like a "natural high." Progesterone levels drop again after childbirth.   If fertilization does not occur, the corpus luteum dies, stopping the production of hormones. This sudden drop in progesterone causes the uterine lining to break down, accompanied by blood (menstruation).   This starts the cycle back at day 1. The whole process starts all over again. Woman go through this cycle every month from puberty to menopause. Women have breaks only for pregnancy and breastfeeding (lactation), unless the woman has health problems that affect the female hormone system or chooses to use oral contraceptives to have unnatural menstrual periods.  HOME CARE  INSTRUCTIONS   Keep track of your periods by using a calendar.   If you use tampons, get the least absorbent to avoid toxic shock syndrome.   Do not leave tampons in the vagina over night or longer than 6 hours.   Wear a sanitary pad over night.   Exercise 3 to 5 times a week or more.   Avoid foods and drinks that you know will make your symptoms worse before or during your period.  SEEK MEDICAL CARE IF:   You develop a fever of 100 F (37.8 C) or higher with your period.   Your periods are lasting more than 7 days.   Your period is so heavy that you have to change pads or tampons every 30 minutes.   You develop clots with your period and never had clots before.   You cannot get relief from over-the-counter medication for your symptoms.   Your period has not started, and it has been longer than 35 days.  Document Released: 08/18/2002 Document Revised: 08/17/2011 Document Reviewed: 06/12/2008 [facility] Patient Information 2012 [place], [place].Bacterial Vaginosis Bacterial vaginosis (BV) is a vaginal infection where the normal balance of bacteria in the vagina is disrupted. The normal balance is then replaced by an overgrowth of certain bacteria. There are several different  kinds of bacteria that can cause BV. BV is the most common vaginal infection in women of childbearing age. CAUSES   The cause of BV is not fully understood. BV develops when there is an increase or imbalance of harmful bacteria.   Some activities or behaviors can upset the normal balance of bacteria in the vagina and put women at increased risk including:   Having a new sex partner or multiple sex partners.   Douching.   Using an intrauterine device (IUD) for contraception.   It is not clear what role sexual activity plays in the development of BV. However, women that have never had sexual intercourse are rarely infected with BV.  Women do not get BV from toilet seats, bedding, swimming pools or from  touching objects around them.  SYMPTOMS   Grey vaginal discharge.   A fish-like odor with discharge, especially after sexual intercourse.   Itching or burning of the vagina and vulva.   Burning or pain with urination.   Some women have no signs or symptoms at all.  DIAGNOSIS  Your caregiver must examine the vagina for signs of BV. Your caregiver will perform lab tests and look at the sample of vaginal fluid through a microscope. They will look for bacteria and abnormal cells (clue cells), a pH test higher than 4.5, and a positive amine test all associated with BV.  RISKS AND COMPLICATIONS   Pelvic inflammatory disease (PID).   Infections following gynecology surgery.   Developing HIV.   Developing herpes virus.  TREATMENT  Sometimes BV will clear up without treatment. However, all women with symptoms of BV should be treated to avoid complications, especially if gynecology surgery is planned. Female partners generally do not need to be treated. However, BV may spread between female sex partners so treatment is helpful in preventing a recurrence of BV.   BV may be treated with antibiotics. The antibiotics come in either pill or vaginal cream forms. Either can be used with nonpregnant or pregnant women, but the recommended dosages differ. These antibiotics are not harmful to the baby.   BV can recur after treatment. If this happens, a second round of antibiotics will often be prescribed.   Treatment is important for pregnant women. If not treated, BV can cause a premature delivery, especially for a pregnant woman who had a premature birth in the past. All pregnant women who have symptoms of BV should be checked and treated.   For chronic reoccurrence of BV, treatment with a type of prescribed gel vaginally twice a week is helpful.  HOME CARE INSTRUCTIONS   Finish all medication as directed by your caregiver.   Do not have sex until treatment is completed.   Tell your sexual partner  that you have a vaginal infection. They should see their caregiver and be treated if they have problems, such as a mild rash or itching.   Practice safe sex. Use condoms. Only have 1 sex partner.  PREVENTION  Basic prevention steps can help reduce the risk of upsetting the natural balance of bacteria in the vagina and developing BV:  Do not have sexual intercourse (be abstinent).   Do not douche.   Use all of the medicine prescribed for treatment of BV, even if the signs and symptoms go away.   Tell your sex partner if you have BV. That way, they can be treated, if needed, to prevent reoccurrence.  SEEK MEDICAL CARE IF:   Your symptoms are not improving after 3  days of treatment.   You have increased discharge, pain, or fever.  MAKE SURE YOU:   Understand these instructions.   Will watch your condition.   Will get help right away if you are not doing well or get worse.  FOR MORE INFORMATION  Division of STD Prevention (DSTDP), Centers for Disease Control and Prevention: [employer] American Social Health Association (ASHA): www.ashastd.org  Document Released: 08/28/2005 Document Revised: 08/17/2011 Document Reviewed: 02/18/2009 [facility] Patient Information 2012 [place], [place].

## 2012-04-12 NOTE — Progress Notes (Signed)
  Subjective:    Patient ID: Jasmine Castaneda, female    DOB: [birth year], 31 y.o.   MRN: [id number]  HPI This is a 31 y.o. female who presents with c/o fishy vaginal odor and irregular periods.  Has long history of BV.  Has been prescribed Metformin but does not take it. Wants to get pregnant.    Review of Systems As in HPI    Objective:   Physical Exam  Constitutional: She is oriented to person, place, and time. She appears well-developed and well-nourished. No distress.  Cardiovascular: Normal rate.   Pulmonary/Chest: Effort normal.  Abdominal: Soft. She exhibits no distension and no mass. There is no tenderness. There is no rebound and no guarding.  Genitourinary: Uterus normal. Vaginal discharge (scant, thin, white) found.  Musculoskeletal: Normal range of motion.  Neurological: She is alert and oriented to person, place, and time.  Skin: Skin is warm and dry.  Psychiatric: She has a normal mood and affect.   Wet prep:  Few Clue, otherwise negative               GC/Chlamydia:  Neg/Neg     Assessment & Plan:  A:  BV, mild      Chronic BV       Infertility       PCOS  P:  Rx Flagyl 2gm all at once       Rx RePhresh       Rx Diflucan       Discussed fertility, menstrual cycle, temp charting and cervical mucous testing.  Discussed proper timing of IC for Conception       Already on Metformin, Recommend she start taking it

## 2012-04-13 LAB — WET PREP, GENITAL
Trich, Wet Prep: NONE SEEN
Yeast Wet Prep HPF POC: NONE SEEN

## 2012-05-14 ENCOUNTER — Telehealth: Payer: Self-pay | Admitting: Medical

## 2012-05-14 NOTE — Telephone Encounter (Signed)
Patient called stating that she needs refills on provera, clomid, fluconazole and metraconazole. Wants metraconazole prescribed 4 tabs at a time. She has not had a cycle and is not pregnant. Hx of PCOS.

## 2012-05-14 NOTE — Telephone Encounter (Signed)
Sent a note to Dr. Macon Large regarding this patient as she was the last provider to see her. The medications requested are not on the current med list and usually require management, so I have asked her to advise and let me know what I can do to help.

## 2012-05-15 NOTE — Telephone Encounter (Signed)
Returned pts call and pt informed me that she needed to the refills as stated below.  I advised pt that she would need to make an infertility appt to see a infertility doctor concerning the clomid due to the fact that pt stated that she has been taking the clomid for 2 years.  I emphasized the reason for the infertilty appt due to the reason that she may need another regimen.  I also informed her that cost with infertility could be expensive.  I also informed pt that she needed to see the provider if she is still having symptoms with the discharge to make sure she gets treated accordingly since she was given medication for symptoms 04/12/12.  I informed pt that I would transfer her to the front desk so she could schedule the appts.

## 2012-05-16 ENCOUNTER — Ambulatory Visit (INDEPENDENT_AMBULATORY_CARE_PROVIDER_SITE_OTHER): Payer: Self-pay | Admitting: Obstetrics & Gynecology

## 2012-05-16 ENCOUNTER — Encounter: Payer: Self-pay | Admitting: Obstetrics & Gynecology

## 2012-05-16 VITALS — BP 112/71 | HR 87 | Temp 99.7°F | Resp 12 | Ht 68.75 in | Wt 155.3 lb

## 2012-05-16 DIAGNOSIS — N898 Other specified noninflammatory disorders of vagina: Secondary | ICD-10-CM

## 2012-05-16 DIAGNOSIS — N760 Acute vaginitis: Secondary | ICD-10-CM

## 2012-05-16 DIAGNOSIS — B9689 Other specified bacterial agents as the cause of diseases classified elsewhere: Secondary | ICD-10-CM

## 2012-05-16 DIAGNOSIS — A499 Bacterial infection, unspecified: Secondary | ICD-10-CM

## 2012-05-16 NOTE — Patient Instructions (Signed)
Return to clinic for any scheduled appointments or for any gynecologic concerns as needed.   

## 2012-05-16 NOTE — Progress Notes (Addendum)
History:  31 y.o. G1P0101 here today for vaginal discharge.  She was seen on 04/12/12 for this same complaint, she was treated with Flagyl for BV. She reports persistent malodorous vaginal discharge. She is very angry and wants another treatment.  The following portions of the patient's history were reviewed and updated as appropriate: allergies, current medications, past family history, past medical history, past social history, past surgical history and problem list.  Review of Systems:  Pertinent items are noted in HPI.  Objective:  Physical Exam Blood pressure 112/71, pulse 87, temperature 99.7 F (37.6 C), temperature source Oral, resp. rate 12, height 5' 8.75" (1.746 m), weight 155 lb 4.8 oz (70.444 kg), last menstrual period 04/02/2012. Gen: NAD Abd: Soft, nontender and nondistended Pelvic: Normal appearing external genitalia; normal appearing vaginal mucosa and cervix.  White, thin, malodorous discharge.  Small uterus, no other palpable masses, no uterine or adnexal tenderness   Assessment & Plan:  Wet prep sent, will follow up results and manage accordingly. If returns as BV, may need prolonged course of metronidazole. Offered boric acid vaginal therapy, patient declined for now.  Lab Addendum 05/17/12 Patient has recurrent bacterial vaginosis.  Prescribed Metronidazole 500 mg po daily x 7 days, followed by 0.75% Metronidazole gel nightly x 10 days then twice a week for 6 months. This is is the recommended therapy for suppression of bacterial vaginosis. Prolonged oral therapy is not recommended because of concerns about toxicity. Patient was offered the alternative regimen with vaginal boric acid 600 mg nightly for three weeks, then twice weekly for 6 months but she declined this modality during her office visit.  Both suppression therapies are to be used after the initial seven day course of oral Metronidazole. A potential side effect of the prolonged therapy is vaginal candidiasis (yeast  infection) so single dose Diflucan 150 mg was prescribed with three refills.

## 2012-05-17 ENCOUNTER — Telehealth: Payer: Self-pay | Admitting: Medical

## 2012-05-17 LAB — WET PREP, GENITAL: Yeast Wet Prep HPF POC: NONE SEEN

## 2012-05-17 MED ORDER — FLUCONAZOLE 150 MG PO TABS: 150.0000 mg | ORAL_TABLET | Freq: Once | ORAL | Status: AC

## 2012-05-17 MED ORDER — METRONIDAZOLE 0.75 % VA GEL: 1.0000 | Freq: Every day | VAGINAL | Status: AC

## 2012-05-17 MED ORDER — METRONIDAZOLE 500 MG PO TABS: 500.0000 mg | ORAL_TABLET | Freq: Two times a day (BID) | ORAL | Status: AC

## 2012-05-17 NOTE — Telephone Encounter (Signed)
Patient called back to clinic asking for clarification of medication regimen. She was unsure whether she could start both the PO meds and the vaginal gel at the same time or had to do them one after the other. I reviewed Dr. Mont Dutton note and explained to patient that she would take the PO Flagyl x 7 days first and then start the vaginal gel x 10 days for the initial treatment course. The patient will also continue the vaginal gel twice weekly for 6 months following the initial treatment. The patient voiced understanding and did not have any further questions.

## 2012-05-17 NOTE — Addendum Note (Signed)
Addended by: Jaynie Collins A on: 05/17/2012 08:41 AM   Modules accepted: Orders

## 2012-05-17 NOTE — Telephone Encounter (Signed)
Called patient to discuss lab results per Dr. Mont Dutton note. Explained the regimen for recurrent BV treatment. Explained that the patient will have the prescriptions available at her pharmacy. The patient asked about intercourse while on the medication. I discussed with Sharen Counter, CNM and she recommended that the patient abstain from intercourse during the initial treatments and any time she was having symptoms in order to allow the medications to work optimally. The patient voiced understanding and did not have any further questions.

## 2012-08-23 ENCOUNTER — Encounter: Payer: Self-pay | Admitting: Obstetrics & Gynecology

## 2012-08-23 ENCOUNTER — Ambulatory Visit (INDEPENDENT_AMBULATORY_CARE_PROVIDER_SITE_OTHER): Payer: Self-pay | Admitting: Obstetrics & Gynecology

## 2012-08-23 VITALS — BP 131/88 | HR 72 | Temp 97.0°F | Ht 68.0 in | Wt 163.1 lb

## 2012-08-23 DIAGNOSIS — N760 Acute vaginitis: Secondary | ICD-10-CM

## 2012-08-23 MED ORDER — FLUCONAZOLE 150 MG PO TABS: 150.0000 mg | ORAL_TABLET | Freq: Once | ORAL | Status: DC

## 2012-08-23 NOTE — Progress Notes (Signed)
History:  31 y.o. G1P0101 here today for followup of BV and vaginitis. Reports that she had inflammation after starting the extended regimen BV, and used Diflucan as instructed. This helped with her symptoms, she wants more Diflucan refills and wants to check if the BV or yeast is still there.  Also reports having a heavy menstrual period that came 3 days earlier and had heavy bleeding for three days associated with cramping.  No other GYN symptoms.   The following portions of the patient's history were reviewed and updated as appropriate: allergies, current medications, past family history, past medical history, past social history, past surgical history and problem list.  Review of Systems:  Pertinent items are noted in HPI.  Objective:  Physical Exam BP 131/88  Pulse 72  Temp 97 F (36.1 C) (Oral)  Ht 5\' 8"  (1.727 m)  Wt 163 lb 1.6 oz (73.982 kg)  BMI 24.80 kg/m2  LMP 08/18/2012 Gen: NAD Abd: Soft, nontender and nondistended Pelvic: Normal appearing external genitalia; normal appearing vaginal mucosa and cervix.  Small amount of brown discharge seen, wet prep sample obtained.  Small uterus, no other palpable masses, no uterine or adnexal tenderness  Assessment & Plan:  Will follow up results of wet prep and manage accordingly. Patient told to return if heavy periods persist for further evaluation Return for any concerning GYN periods

## 2012-08-23 NOTE — Patient Instructions (Signed)
Return to clinic for any scheduled appointments or for any gynecologic concerns as needed.   

## 2012-08-30 ENCOUNTER — Telehealth: Payer: Self-pay | Admitting: General Practice

## 2012-08-30 ENCOUNTER — Ambulatory Visit: Payer: Self-pay | Admitting: Advanced Practice Midwife

## 2012-08-30 NOTE — Telephone Encounter (Signed)
Called patient and informed her of yeast on most recent wet prep and that diflucan had been called in to her [facility] pharmacy and that she may repeat another dose in 3 days if the first one doesn't work for a total of three doses. Patient verbalized understanding and had no further questions

## 2012-08-30 NOTE — Telephone Encounter (Signed)
Message copied by Kathee Delton on Fri Aug 30, 2012  8:10 AM ------      Message from: Jasmine Castaneda A      Created: Thu Aug 29, 2012  4:28 PM       Advise patient to use Diflucan as prescribed, if persistent, can use every 3 days for a total of 3 doses.

## 2012-09-24 ENCOUNTER — Telehealth: Payer: Self-pay | Admitting: *Deleted

## 2012-09-24 NOTE — Telephone Encounter (Signed)
Patient left a message requesting a refill on Flagyl. I called patient to discuss her symptoms, and had to leave a message requesting she call us back.

## 2012-09-25 ENCOUNTER — Other Ambulatory Visit: Payer: Self-pay

## 2012-09-25 NOTE — Telephone Encounter (Signed)
Pt called the front desk and informed me that she was unhappy with the provider cause she does not want to Rx her flagyl which the pt states "that is works for me, I know what works for my body".  I informed pt that the provider is wanting her to understand the need to change her regimen because with flagyl she is continuing to have the same symptoms reoccur and its time for a new regimen.  Pt was still unhappy.  I highly encouraged her to please try the boric acid regimen.  Due to the fact she is states that she is raw, I advised her that it would be best to please try the regimen its natural and it would help with the irritation.  Pt stated that she would try the boric acid.  I explained to the pt how to take the medication and that she will have some discharge from medication but its normal and that she should a panty liner.  I called [employer] and called pt to let her know that she can go pick it up.  I gave pt telephone # and address to [facility].

## 2012-09-25 NOTE — Telephone Encounter (Addendum)
Called pt and due to her giving [place] permission to leave her a message letting her know of regimen I left message stating exactly what is written by Dr. Macon Large and if she has any further questions to please give [place] a call back at the clinics.  I strongly emphasized the reason for not recommending prolonged oral therapy of flagyl is due to toxicity in the message.

## 2012-09-25 NOTE — Telephone Encounter (Signed)
Patient has recurrent bacterial vaginosis. The standard of care regimen is Metronidazole 500 mg po daily x 7 days, followed by 0.75% Metronidazole gel nightly x 10 days, then 0.75% Metronidazole gel nightly twice a week for 6 months. This is the recommended therapy for suppression of bacterial vaginosis. Prolonged oral therapy is not recommended because of concerns about toxicity. Patient can be offered the alternative regimen with vaginal boric acid 600 mg nightly for three weeks, then twice weekly for 6 months.  I will not prescribe a prolonged course of oral Metronidazole/Flagyl, she is free to get another opinion from another provider/practice.

## 2012-09-25 NOTE — Telephone Encounter (Signed)
Spoke with patient. She wants to switch from using metrogel to using flagyl. Per Dr Macon Large note pt is to use Metrogel for 10days x82mo, for best supression. I informed patient of what note says and she became upset and stated she is the patient and she knows what works for her. I advised her that I would send a message to Dr. Macon Large and see if she would authorize flagyl instead. Pt stated that we can call her back and leave her a message letting her know.

## 2013-02-04 ENCOUNTER — Telehealth: Payer: Self-pay | Admitting: *Deleted

## 2013-02-04 NOTE — Telephone Encounter (Addendum)
Pt left message requesting refill of Boric Acid suppositories. Please send to [facility].  I called pt back and left message that her refill has been called in to [facility]. I also need to speak with her and I will call back.  Pt called back later in the [facility] and we discussed her current sx and c/o.  She had last obtained the Rx from the pharmacy in March but has run out. I stated that she should still have some capsules left. She states that she did not use the suppositories correctly and the discharge has re-occurred. I advised her of the correct regimen and she voiced understanding. I also told pt that if she continues to have the vaginal discharge and odor, she will need an appt for evaluation. Pt voiced understanding.

## 2013-02-06 MED ORDER — BORIC ACID POWD: Status: DC

## 2013-04-30 ENCOUNTER — Telehealth: Payer: Self-pay | Admitting: General Practice

## 2013-04-30 NOTE — Telephone Encounter (Signed)
Patient called and left message that was very difficult to hear because the patient was whispering, patient did say she would like a refill on her boric acid because she cannot get an appt here till September, please call her back and let her know what she needs to do.

## 2013-05-01 ENCOUNTER — Telehealth: Payer: Self-pay | Admitting: *Deleted

## 2013-05-01 ENCOUNTER — Ambulatory Visit (INDEPENDENT_AMBULATORY_CARE_PROVIDER_SITE_OTHER): Payer: Self-pay | Admitting: Obstetrics and Gynecology

## 2013-05-01 VITALS — BP 117/85 | HR 99 | Ht 68.0 in | Wt 162.9 lb

## 2013-05-01 DIAGNOSIS — E282 Polycystic ovarian syndrome: Secondary | ICD-10-CM

## 2013-05-01 DIAGNOSIS — Z01419 Encounter for gynecological examination (general) (routine) without abnormal findings: Secondary | ICD-10-CM

## 2013-05-01 DIAGNOSIS — N760 Acute vaginitis: Secondary | ICD-10-CM | POA: Insufficient documentation

## 2013-05-01 DIAGNOSIS — Z0000 Encounter for general adult medical examination without abnormal findings: Secondary | ICD-10-CM

## 2013-05-01 MED ORDER — BORIC ACID POWD: Status: DC

## 2013-05-01 NOTE — Telephone Encounter (Signed)
Called in prescription for Boric acid capsules per vagina as requested by D. Christy Gentles, CNM

## 2013-05-01 NOTE — Telephone Encounter (Signed)
Pt coming in for appt today °

## 2013-05-01 NOTE — Addendum Note (Signed)
Addended by: Kathee Delton on: 05/01/2013 04:21 PM   Modules accepted: Orders

## 2013-05-01 NOTE — Progress Notes (Signed)
CC: Gynecologic Exam and BV    HPI Jasmine Castaneda is a 32 y.o. G1P0101 here for Pap smear and requesting treatment for recurrent vaginitis. She states she had a LEEP in 2005 (Dr. Rana Snare) and all Paps normal after that. Last Pap 10/13/2011 was negative. She is unsure of her specific abnormal cervical cytology dx. She is concerned that she gets frequent BP and several wet prep is done here to document few clue cells. She states she gets thin malodorous vaginal discharge often before and after menses. She has had some effect from using boric acid vaginal suppositories and would like a refill on that prescription. She states she is allergic to Flagyl and gets rash requiring treatment with prednisone which he takes Flagyl. She currently has what she feels is an abnormal vaginal discharge with malodor. She does not douche. 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 Her child is 63 years old and she used IUD for 5 years after the birth. For the last 3 years she has been unsuccessful in attempting to conceive and has been on metformin and Clomid during that period. Since she had the IUD removed her menstrual interval is fairly regular at 45 days. LMP 04/15/2013. She has normal flow and no dysmenorrhea.  Past Medical History  Diagnosis Date  . Abnormal Pap smear     leep  . Infertility   . PCOS (polycystic ovarian syndrome)   TCS diagnosed by ultrasound and abnormal bleeding pattern.  OB History  Gravida Para Term Preterm AB SAB TAB Ectopic Multiple Living  1 1  1  0 0 0 0 0 1    # Outcome Date GA Lbr Len/2nd Weight Sex Delivery Anes PTL Lv  1 PRE 11/2004 [redacted]w[redacted]d   F SVD EPI       Comments: Induced for pain/edema      Past Surgical History  Procedure Laterality Date  . Leep      History   Social History  . Marital Status: Married    Spouse Name: N/A    Number of Children: N/A  . Years of Education: N/A   Occupational History  . Not on  file.   Social History Main Topics  . Smoking status: Never Smoker   . Smokeless tobacco: Never Used  . Alcohol Use: 0.6 oz/week    1 Glasses of wine per week     Comment: social drinker  . Drug Use: No  . Sexual Activity: Yes    Birth Control/ Protection: None   Other Topics Concern  . Not on file   Social History Narrative  . No narrative on file    Current Outpatient Prescriptions on File Prior to Visit  Medication Sig Dispense Refill  . Cetirizine HCl (ZYRTEC PO) Take 1 tablet by mouth daily as needed. Patient used this medication for allergy symptoms.      Marland Kitchen acetaminophen (TYLENOL) 500 MG tablet Take 1,000 mg by mouth every 6 (six) hours as needed. pain       . EPINEPHrine (EPIPEN) 0.3 mg/0.3 mL DEVI Inject 0.3 mLs (0.3 mg total) into the muscle as needed.  2 Device  0  . fluconazole (DIFLUCAN) 150 MG tablet Take 1 tablet (150 mg total) by mouth once.  1 tablet  6  . metFORMIN (GLUCOPHAGE) 500 MG tablet Take 500 mg by mouth 2 (two) times daily with a meal.      . Multiple Vitamin (MULTIVITAMIN) tablet Take 1 tablet  by mouth daily.         No current facility-administered medications on file prior to visit.    Allergies  Allergen Reactions  . Septra [Bactrim] Anaphylaxis    ROS Pertinent items in HPI  PHYSICAL EXAM Filed Vitals:   05/01/13 1304  BP: 117/85  Pulse: 99   General: Well nourished, well developed female in no acute distress Skin: mild hirsutism Neck: no thyromegaly Breast: Symmetric, no discrete masses, no lymphadenopathy Cardiovascular: Normal rate Respiratory: Normal effort Abdomen: Soft, nontender Back: No CVAT Extremities: No edema Neurologic: Alert and oriented Speculum exam: NEFG; vagina with physiologic discharge, no blood; cervix clean Bimanual exam: cervix closed, no CMT; uterus NSSP; no adnexal tenderness or masses   LAB   Wet prep sent I   ASSESSMENT  1. Recurrent vaginitis   2. PCOS (polycystic ovarian syndrome)   with  secondary infertility   PLAN Discharge home. See AVS for patient education.  Rx Boric acid tablets. Advised Actigel or product to normalize vaginal pH. Will treat vagintis after WP resulted. F/U yearly if Pap normal     Danae Orleans, CNM 05/01/2013 2:17 PM

## 2013-05-01 NOTE — Addendum Note (Signed)
Addended by: Kathee Delton on: 05/01/2013 04:16 PM   Modules accepted: Orders

## 2013-05-02 ENCOUNTER — Ambulatory Visit: Payer: Self-pay | Admitting: Advanced Practice Midwife

## 2013-05-02 LAB — WET PREP, GENITAL: Clue Cells Wet Prep HPF POC: NONE SEEN

## 2013-05-14 ENCOUNTER — Ambulatory Visit: Payer: Self-pay | Admitting: Nurse Practitioner

## 2013-07-07 ENCOUNTER — Inpatient Hospital Stay (HOSPITAL_COMMUNITY)
Admission: AD | Admit: 2013-07-07 | Discharge: 2013-07-07 | Disposition: A | Discharge status: Home / Self Care | Payer: Self-pay | Source: Ambulatory Visit | Attending: Obstetrics & Gynecology | Admitting: Obstetrics & Gynecology

## 2013-07-07 ENCOUNTER — Inpatient Hospital Stay (HOSPITAL_COMMUNITY): Payer: Self-pay

## 2013-07-07 DIAGNOSIS — Z3202 Encounter for pregnancy test, result negative: Secondary | ICD-10-CM | POA: Insufficient documentation

## 2013-07-07 DIAGNOSIS — G8929 Other chronic pain: Secondary | ICD-10-CM | POA: Insufficient documentation

## 2013-07-07 DIAGNOSIS — R109 Unspecified abdominal pain: Secondary | ICD-10-CM | POA: Insufficient documentation

## 2013-07-07 DIAGNOSIS — N949 Unspecified condition associated with female genital organs and menstrual cycle: Secondary | ICD-10-CM | POA: Insufficient documentation

## 2013-07-07 DIAGNOSIS — E282 Polycystic ovarian syndrome: Secondary | ICD-10-CM | POA: Insufficient documentation

## 2013-07-07 LAB — URINALYSIS, ROUTINE W REFLEX MICROSCOPIC
Bilirubin Urine: NEGATIVE
Ketones, ur: NEGATIVE mg/dL
Leukocytes, UA: NEGATIVE
Nitrite: NEGATIVE
Specific Gravity, Urine: >1.03 — ABNORMAL HIGH (ref 1.005–1.030)
Urobilinogen, UA: 0.2 mg/dL (ref 0.0–1.0)
pH: 5.5 (ref 5.0–8.0)

## 2013-07-07 LAB — WET PREP, GENITAL
Trich, Wet Prep: NONE SEEN
Yeast Wet Prep HPF POC: NONE SEEN

## 2013-07-07 LAB — POCT PREGNANCY, URINE: Preg Test, Ur: NEGATIVE

## 2013-07-07 LAB — URINE MICROSCOPIC-ADD ON

## 2013-07-07 MED ORDER — KETOROLAC TROMETHAMINE 60 MG/2ML IM SOLN
60.0000 mg | Freq: Once | INTRAMUSCULAR | Status: AC
  Administered 2013-07-07: 60 mg via INTRAMUSCULAR
  Filled 2013-07-07: qty 2

## 2013-07-07 NOTE — MAU Note (Signed)
Patient states she has not had a period since 8-6. States she has been having abdominal pain for a long time and feels "flutters" in the abdomen. Denies bleeding. Does have a discharge that she has all the time, no color or odor.

## 2013-07-07 NOTE — MAU Note (Signed)
States she has had intermittent, sharp, and shooting pain from vagina that goes up to the lower right side(Over her ovary). Trying to get pregnant and goes to clinic downstairs. Takes ibuprophen for this pain.

## 2013-07-07 NOTE — MAU Provider Note (Signed)
History     CSN: [id number]  Arrival date and time: 07/07/13 1402   First Provider Initiated Contact with Patient 07/07/13 1607      Chief Complaint  Patient presents with  . Possible Pregnancy  . Abdominal Pain   HPI  [name] is a 32 y.o.G1P0101 non pregnant female who presents with concerns for pregnancy, her last cycle was in August. She has a history of PCOS so it is not uncommon for her cycles to be irregular, however she has never gone this long without having a cycle. She is currently experiencing abdominal pain; sharp shooting pain from her vagina to the right side of her lower abdomen. The pain at times feels like flutter in her abdomen. She has chronic recurrent bacterial vaginosis and is currently using boric acid powder. The patient called the clinic today to try and schedule an appointment and was told it would be a few weeks before she could get in.  The patient highly desires pregnancy at this time. Pt currently rates her abdominal pain 6/10.   OB History   Grav Para Term Preterm Abortions TAB SAB Ect Mult Living   1 1  1  0 0 0 0 0 1      Past Medical History  Diagnosis Date  . Abnormal Pap smear     leep  . Infertility   . PCOS (polycystic ovarian syndrome)     Past Surgical History  Procedure Laterality Date  . Leep      Family History  Problem Relation Age of Onset  . Hypertension Father   . Cancer Mother     History  Substance Use Topics  . Smoking status: Never Smoker   . Smokeless tobacco: Never Used  . Alcohol Use: 0.6 oz/week    1 Glasses of wine per week     Comment: social drinker    Allergies:  Allergies  Allergen Reactions  . Septra [Bactrim] Anaphylaxis    Prescriptions prior to admission  Medication Sig Dispense Refill  . Boric Acid POWD Use 1 capsule per vagina at bedtime daily for 3 weeks, then 1 capsule per vagina at bedtime twice a week for 6 months.  2 Bottle  5  . Cetirizine HCl (ZYRTEC PO) Take 1  tablet by mouth daily as needed. Patient used this medication for allergy symptoms.      . fluconazole (DIFLUCAN) 150 MG tablet Take 1 tablet (150 mg total) by mouth once.  1 tablet  6  . ibuprofen (ADVIL,MOTRIN) 600 MG tablet Take 600 mg by mouth every 6 (six) hours as needed for pain.      . Multiple Vitamin (MULTIVITAMIN) tablet Take 1 tablet by mouth daily.        Marland Kitchen acetaminophen (TYLENOL) 500 MG tablet Take 1,000 mg by mouth every 6 (six) hours as needed. pain       . EPINEPHrine (EPIPEN) 0.3 mg/0.3 mL DEVI Inject 0.3 mLs (0.3 mg total) into the muscle as needed.  2 Device  0  . metFORMIN (GLUCOPHAGE) 500 MG tablet Take 500 mg by mouth 2 (two) times daily with a meal.       Results for orders placed during the hospital encounter of 07/07/13 (from the past 24 hour(s))  URINALYSIS, ROUTINE W REFLEX MICROSCOPIC     Status: Abnormal   Collection Time    07/07/13  3:10 PM      Result Value Range   Color, Urine YELLOW  YELLOW   APPearance  CLEAR  CLEAR   Specific Gravity, Urine >1.030 (*) 1.005 - 1.030   pH 5.5  5.0 - 8.0   Glucose, UA NEGATIVE  NEGATIVE mg/dL   Hgb urine dipstick SMALL (*) NEGATIVE   Bilirubin Urine NEGATIVE  NEGATIVE   Ketones, ur NEGATIVE  NEGATIVE mg/dL   Protein, ur NEGATIVE  NEGATIVE mg/dL   Urobilinogen, UA 0.2  0.0 - 1.0 mg/dL   Nitrite NEGATIVE  NEGATIVE   Leukocytes, UA NEGATIVE  NEGATIVE  URINE MICROSCOPIC-ADD ON     Status: None   Collection Time    07/07/13  3:10 PM      Result Value Range   Squamous Epithelial / LPF RARE  RARE   WBC, UA 0-2  <3 WBC/hpf   RBC / HPF 3-6  <3 RBC/hpf   Bacteria, UA RARE  RARE  POCT PREGNANCY, URINE     Status: None   Collection Time    07/07/13  3:26 PM      Result Value Range   Preg Test, Ur NEGATIVE  NEGATIVE  WET PREP, GENITAL     Status: Abnormal   Collection Time    07/07/13  4:30 PM      Result Value Range   Yeast Wet Prep HPF POC NONE SEEN  NONE SEEN   Trich, Wet Prep NONE SEEN  NONE SEEN   Clue Cells Wet  Prep HPF POC FEW (*) NONE SEEN   WBC, Wet Prep HPF POC FEW (*) NONE SEEN   US Transvaginal Non-ob  07/07/2013   CLINICAL DATA:  Pelvic pain for 1 month, right lower quadrant pain, history polycystic ovary syndrome, LEEP  EXAM: TRANSABDOMINAL AND TRANSVAGINAL ULTRASOUND OF PELVIS  TECHNIQUE: Both transabdominal and transvaginal ultrasound examinations of the pelvis were performed. Transabdominal technique was performed for global imaging of the pelvis including uterus, ovaries, adnexal regions, and pelvic cul-de-sac. It was necessary to proceed with endovaginal exam following the transabdominal exam to visualize the endometrium and ovaries.  COMPARISON:  11/17/2011  FINDINGS: Uterus  Measurements: 6.3 x 3.5 x 4.3 cm. Normal morphology without focal uterine mass.  Endometrium  Thickness: 5.4 mm thick, normal. No endometrial fluid.  Right ovary  Measurements: 3.8 x 2.6 x 2.7 cm. Numerous peripheral follicles ringing the right ovary. No dominant mass.  Left ovary  Measurements: 3.4 x 2.2 x 2.9 cm. Numerous peripheral follicles ringing the left ovary. No dominant mass.  Other findings  Small amount of nonspecific free pelvic fluid.  No adnexal masses.  IMPRESSION: Numerous follicles in the periphery of both ovaries ovarian sizes similar to previous exam, could be consistent with polycystic ovarian syndrome in the appropriate clinical setting.  Remainder of exam unremarkable.   Electronically Signed   By: Ulyses Southward M.D.   On: 07/07/2013 17:40   US Pelvis Complete  07/07/2013   CLINICAL DATA:  Pelvic pain for 1 month, right lower quadrant pain, history polycystic ovary syndrome, LEEP  EXAM: TRANSABDOMINAL AND TRANSVAGINAL ULTRASOUND OF PELVIS  TECHNIQUE: Both transabdominal and transvaginal ultrasound examinations of the pelvis were performed. Transabdominal technique was performed for global imaging of the pelvis including uterus, ovaries, adnexal regions, and pelvic cul-de-sac. It was necessary to proceed with  endovaginal exam following the transabdominal exam to visualize the endometrium and ovaries.  COMPARISON:  11/17/2011  FINDINGS: Uterus  Measurements: 6.3 x 3.5 x 4.3 cm. Normal morphology without focal uterine mass.  Endometrium  Thickness: 5.4 mm thick, normal. No endometrial fluid.  Right ovary  Measurements: 3.8  x 2.6 x 2.7 cm. Numerous peripheral follicles ringing the right ovary. No dominant mass.  Left ovary  Measurements: 3.4 x 2.2 x 2.9 cm. Numerous peripheral follicles ringing the left ovary. No dominant mass.  Other findings  Small amount of nonspecific free pelvic fluid.  No adnexal masses.  IMPRESSION: Numerous follicles in the periphery of both ovaries ovarian sizes similar to previous exam, could be consistent with polycystic ovarian syndrome in the appropriate clinical setting.  Remainder of exam unremarkable.   Electronically Signed   By: Ulyses Southward M.D.   On: 07/07/2013 17:40    Review of Systems  Constitutional: Negative for fever and chills.  Gastrointestinal: Positive for abdominal pain. Negative for nausea, vomiting, diarrhea and constipation.  Genitourinary: Negative for dysuria, urgency, frequency and hematuria.       No vaginal discharge. No vaginal bleeding. No dysuria.    Physical Exam   Blood pressure 117/87, pulse 71, temperature 98.5 F (36.9 C), temperature source Oral, resp. rate 16, height 5\' 8"  (1.727 m), weight 74.753 kg (164 lb 12.8 oz), last menstrual period 04/16/2013, SpO2 100.00%.  Physical Exam  Constitutional: She is oriented to person, place, and time. She appears well-developed and well-nourished. No distress.  HENT:  Head: Normocephalic.  Neck: Normal range of motion.  Respiratory: Effort normal.  GI: Soft. She exhibits no distension and no mass. There is tenderness. There is no rebound and no guarding.  Right lower quadrant tenderness Suprapubic tenderness  Genitourinary: Vaginal discharge found.  Speculum exam: Vagina - Small amount of  creamy, thin, white discharge, no odor Cervix - No contact bleeding Bimanual exam: Cervix closed, no CMT Uterus non tender, normal size Adnexa non tender, no masses bilaterally GC/Chlam, wet prep done Chaperone present for exam.   Neurological: She is alert and oriented to person, place, and time.  Skin: Skin is warm. She is not diaphoretic.    MAU Course  Procedures None  MDM UA  Toradol 60 mg IM  Wet prep GC/Chlamydia- pending Pelvic US complete  Patient rates pain 4/10 at discharge   Assessment and Plan  A: 1. Chronic pelvic pain in female   2. PCOS (polycystic ovarian syndrome)   3:   Encounter for pregnancy test; pregnancy test negative.   P: Discharge home  Schedule a follow up appointment in the clinic Ok to take ibuprofen as needed, as directed on the bottle  [facility], Kashius Dominic IRENE FNP-C 07/07/2013, 7:44 PM

## 2013-07-08 LAB — GC/CHLAMYDIA PROBE AMP: GC Probe RNA: NEGATIVE

## 2013-07-11 NOTE — MAU Provider Note (Signed)
Attestation of Attending Supervision of Advanced Practitioner (CNM/NP): Evaluation and management procedures were performed by the Advanced Practitioner under my supervision and collaboration. I have reviewed the Advanced Practitioner's note and chart, and I agree with the management and plan.  Swanson Farnell H. 11:32 AM

## 2013-07-29 ENCOUNTER — Telehealth: Payer: Self-pay | Admitting: *Deleted

## 2013-07-29 NOTE — Telephone Encounter (Addendum)
07/29/2013:  Pt called nurse line requesting refill on prescriptions Provera, Metformin, Clomid.  States she has not had a cycle in 60 days.  States this is her second message.  07/29/2013:  Called patient and informed her after discussing her case with the physician, she would need an appt before the prescriptions could be refilled.  Offered to make an appointment for her or she could call the front desk.  Pt verbalizes understanding.

## 2013-07-30 ENCOUNTER — Telehealth: Payer: Self-pay | Admitting: *Deleted

## 2013-07-30 DIAGNOSIS — IMO0002 Reserved for concepts with insufficient information to code with codable children: Secondary | ICD-10-CM

## 2013-07-30 NOTE — Telephone Encounter (Signed)
Called [facility] regarding another patient and they stated they need a referral and notes for this patient in order for her to be seen.  Referral and notes sent as she is our patient.

## 2013-08-25 ENCOUNTER — Encounter (HOSPITAL_BASED_OUTPATIENT_CLINIC_OR_DEPARTMENT_OTHER): Payer: Self-pay | Admitting: Emergency Medicine

## 2013-08-25 ENCOUNTER — Emergency Department (HOSPITAL_BASED_OUTPATIENT_CLINIC_OR_DEPARTMENT_OTHER)
Admission: EM | Admit: 2013-08-25 | Discharge: 2013-08-25 | Disposition: A | Discharge status: Home / Self Care | Payer: Self-pay | Attending: Emergency Medicine | Admitting: Emergency Medicine

## 2013-08-25 ENCOUNTER — Emergency Department (HOSPITAL_BASED_OUTPATIENT_CLINIC_OR_DEPARTMENT_OTHER): Payer: Self-pay

## 2013-08-25 DIAGNOSIS — J40 Bronchitis, not specified as acute or chronic: Secondary | ICD-10-CM

## 2013-08-25 DIAGNOSIS — Z79899 Other long term (current) drug therapy: Secondary | ICD-10-CM | POA: Insufficient documentation

## 2013-08-25 DIAGNOSIS — J029 Acute pharyngitis, unspecified: Secondary | ICD-10-CM

## 2013-08-25 DIAGNOSIS — J209 Acute bronchitis, unspecified: Secondary | ICD-10-CM | POA: Insufficient documentation

## 2013-08-25 DIAGNOSIS — R599 Enlarged lymph nodes, unspecified: Secondary | ICD-10-CM | POA: Insufficient documentation

## 2013-08-25 DIAGNOSIS — Z8742 Personal history of other diseases of the female genital tract: Secondary | ICD-10-CM | POA: Insufficient documentation

## 2013-08-25 DIAGNOSIS — IMO0001 Reserved for inherently not codable concepts without codable children: Secondary | ICD-10-CM | POA: Insufficient documentation

## 2013-08-25 MED ORDER — AZITHROMYCIN 250 MG PO TABS: 250.0000 mg | ORAL_TABLET | Freq: Every day | ORAL | Status: DC

## 2013-08-25 MED ORDER — PHENYLEPH-PROMETHAZINE-COD 5-6.25-10 MG/5ML PO SYRP: 5.0000 mL | ORAL_SOLUTION | ORAL | Status: DC | PRN

## 2013-08-25 NOTE — ED Provider Notes (Signed)
CSN: [id number]     Arrival date & time 08/25/13  1730 History   First MD Initiated Contact with Patient 08/25/13 2005     Chief Complaint  Patient presents with  . URI   (Consider location/radiation/quality/duration/timing/severity/associated sxs/prior Treatment) Patient is a 32 y.o. female presenting with URI. The history is provided by the patient.  URI Presenting symptoms: congestion, cough, fever and sore throat   Presenting symptoms: no ear pain   Severity:  Moderate Onset quality:  Gradual Duration:  1 week Timing:  Constant Progression:  Worsening Chronicity:  New Relieved by:  Nothing Worsened by:  Nothing tried Ineffective treatments:  Decongestant, OTC medications and rest Associated symptoms: myalgias and sinus pain   Associated symptoms: no headaches and no wheezing    Jasmine Castaneda is a 32 y.o. female who presents to the ED with cough, cold and congestion with fever, chills and sore throat x 1 week.  She denies nausea, vomiting or diarrhea.  Past Medical History  Diagnosis Date  . Abnormal Pap smear     leep  . Infertility   . PCOS (polycystic ovarian syndrome)    Past Surgical History  Procedure Laterality Date  . Leep     Family History  Problem Relation Age of Onset  . Hypertension Father   . Cancer Mother    History  Substance Use Topics  . Smoking status: Never Smoker   . Smokeless tobacco: Never Used  . Alcohol Use: 0.6 oz/week    1 Glasses of wine per week     Comment: social drinker   OB History   Grav Para Term Preterm Abortions TAB SAB Ect Mult Living   1 1  1  0 0 0 0 0 1     Review of Systems  Constitutional: Positive for fever.  HENT: Positive for congestion and sore throat. Negative for ear pain and trouble swallowing.   Eyes: Negative for pain, redness and visual disturbance.  Respiratory: Positive for cough and shortness of breath (with cough). Negative for wheezing.   Gastrointestinal: Negative for nausea, vomiting and  abdominal pain.  Genitourinary: Negative for dysuria, urgency and frequency.  Musculoskeletal: Positive for myalgias.  Skin: Negative for rash.  Allergic/Immunologic: Negative for immunocompromised state.  Neurological: Negative for dizziness, light-headedness and headaches.  Psychiatric/Behavioral: The patient is not nervous/anxious.     Allergies  Septra  Home Medications   Current Outpatient Rx  Name  Route  Sig  Dispense  Refill  . acetaminophen (TYLENOL) 500 MG tablet   Oral   Take 1,000 mg by mouth every 6 (six) hours as needed. pain          . Boric Acid POWD      Use 1 capsule per vagina at bedtime daily for 3 weeks, then 1 capsule per vagina at bedtime twice a week for 6 months.   2 Bottle   5     Please compound capsules (600 mg each) for intra v ...   . Cetirizine HCl (ZYRTEC PO)   Oral   Take 1 tablet by mouth daily as needed. Patient used this medication for allergy symptoms.         Marland Kitchen EPINEPHrine (EPIPEN) 0.3 mg/0.3 mL DEVI   Intramuscular   Inject 0.3 mLs (0.3 mg total) into the muscle as needed.   2 Device   0   . fluconazole (DIFLUCAN) 150 MG tablet   Oral   Take 1 tablet (150 mg total) by mouth once.  1 tablet   6   . ibuprofen (ADVIL,MOTRIN) 600 MG tablet   Oral   Take 600 mg by mouth every 6 (six) hours as needed for pain.         . metFORMIN (GLUCOPHAGE) 500 MG tablet   Oral   Take 500 mg by mouth 2 (two) times daily with a meal.         . Multiple Vitamin (MULTIVITAMIN) tablet   Oral   Take 1 tablet by mouth daily.            BP 134/85  Pulse 99  Temp(Src) 99.1 F (37.3 C) (Oral)  Resp 20  Ht 5\' 9"  (1.753 m)  Wt 164 lb (74.39 kg)  BMI 24.21 kg/m2  SpO2 99%  LMP 08/25/2013 Physical Exam  Nursing note and vitals reviewed. Constitutional: She is oriented to person, place, and time. She appears well-developed and well-nourished.  HENT:  Head: Normocephalic and atraumatic.  Right Ear: Tympanic membrane normal.  Left  Ear: Tympanic membrane normal.  Nose: Rhinorrhea present.  Mouth/Throat: Uvula is midline and mucous membranes are normal. Posterior oropharyngeal erythema present.  Eyes: EOM are normal.  Neck: Neck supple.  Cardiovascular: Normal rate and regular rhythm.   Pulmonary/Chest: Effort normal. She has no wheezes. She has no rales.  Abdominal: Soft. There is no tenderness.  Musculoskeletal: Normal range of motion.  Lymphadenopathy:    She has cervical adenopathy.  Neurological: She is alert and oriented to person, place, and time. No cranial nerve deficit.  Skin: Skin is warm and dry.  Psychiatric: She has a normal mood and affect. Her behavior is normal.    ED Course  Procedures (including critical care time) Labs Review Labs Reviewed - No data to display Imaging Review Dg Chest 2 View  08/25/2013   CLINICAL DATA:  Cough.  EXAM: CHEST  2 VIEW  COMPARISON:  12/07/2006.  FINDINGS: Mediastinum and hilar structures are normal. Lungs are clear. No pleural effusion pneumothorax. Heart size and pulmonary vascularity normal. Thoracolumbar scoliosis is again noted.  IMPRESSION: No active cardiopulmonary disease.   Electronically Signed   By: Maisie Fus  Register   On: 08/25/2013 18:23    EKG Interpretation   None       MDM  32 y.o. female with sore throat, cough and congestion x 1 week. Will treat for bronchitis. She is stable for discharge without any further screening at this time. She will return for any problems.  Discussed with the patient clinical and x=ray findings and all questioned fully answered.    Medication List    TAKE these medications       azithromycin 250 MG tablet  Commonly known as:  ZITHROMAX  Take 1 tablet (250 mg total) by mouth daily. Take first 2 tablets together, then 1 every day until finished.     Phenyleph-Promethazine-Cod 5-6.25-10 MG/5ML Syrp  Take 5 mLs by mouth every 4 (four) hours as needed.      ASK your doctor about these medications        acetaminophen 500 MG tablet  Commonly known as:  TYLENOL  Take 1,000 mg by mouth every 6 (six) hours as needed. pain     Boric Acid Powd  Use 1 capsule per vagina at bedtime daily for 3 weeks, then 1 capsule per vagina at bedtime twice a week for 6 months.     EPINEPHrine 0.3 mg/0.3 mL Devi  Commonly known as:  EPIPEN  Inject 0.3 mLs (0.3 mg total) into  the muscle as needed.     fluconazole 150 MG tablet  Commonly known as:  DIFLUCAN  Take 1 tablet (150 mg total) by mouth once.     ibuprofen 600 MG tablet  Commonly known as:  ADVIL,MOTRIN  Take 600 mg by mouth every 6 (six) hours as needed for pain.     metFORMIN 500 MG tablet  Commonly known as:  GLUCOPHAGE  Take 500 mg by mouth 2 (two) times daily with a meal.     multivitamin tablet  Take 1 tablet by mouth daily.     ZYRTEC PO  Take 1 tablet by mouth daily as needed. Patient used this medication for allergy symptoms.           [facility] Orlene Och, [place] 08/26/13 [phone]

## 2013-08-25 NOTE — ED Notes (Signed)
Pt reports cough, congestion, fever, and sore throat for 1 week.

## 2013-08-27 NOTE — ED Provider Notes (Signed)
Medical screening examination/treatment/procedure(s) were performed by non-physician practitioner and as supervising physician I was immediately available for consultation/collaboration.  EKG Interpretation   None        Doug Sou, MD 08/27/13 [phone]

## 2013-12-18 ENCOUNTER — Encounter: Payer: Self-pay | Admitting: Obstetrics & Gynecology

## 2013-12-18 ENCOUNTER — Ambulatory Visit (INDEPENDENT_AMBULATORY_CARE_PROVIDER_SITE_OTHER): Payer: Medicaid Other | Admitting: Obstetrics & Gynecology

## 2013-12-18 VITALS — BP 132/92 | HR 81 | Temp 97.7°F | Ht 68.0 in | Wt 164.0 lb

## 2013-12-18 DIAGNOSIS — N912 Amenorrhea, unspecified: Secondary | ICD-10-CM

## 2013-12-18 DIAGNOSIS — E282 Polycystic ovarian syndrome: Secondary | ICD-10-CM

## 2013-12-18 LAB — POCT PREGNANCY, URINE: Preg Test, Ur: NEGATIVE

## 2013-12-18 MED ORDER — MEDROXYPROGESTERONE ACETATE 10 MG PO TABS: 10.0000 mg | ORAL_TABLET | Freq: Every day | ORAL | Status: DC

## 2013-12-18 MED ORDER — METFORMIN HCL 500 MG PO TABS: 1000.0000 mg | ORAL_TABLET | Freq: Two times a day (BID) | ORAL | Status: DC

## 2013-12-18 NOTE — Patient Instructions (Signed)
Polycystic Ovarian Syndrome Polycystic ovarian syndrome (PCOS) is a common hormonal disorder among women of reproductive age. Most women with PCOS grow many small cysts on their ovaries. PCOS can cause problems with your periods and make it difficult to get pregnant. It can also cause an increased risk of miscarriage with pregnancy. If left untreated, PCOS can lead to serious health problems, such as diabetes and heart disease. CAUSES The cause of PCOS is not fully understood, but genetics may be a factor. SIGNS AND SYMPTOMS   Infrequent or no menstrual periods.   Inability to get pregnant (infertility) because of not ovulating.   Increased growth of hair on the face, chest, stomach, back, thumbs, thighs, or toes.   Acne, oily skin, or dandruff.   Pelvic pain.   Weight gain or obesity, usually carrying extra weight around the waist.   Type 2 diabetes.   High cholesterol.   High blood pressure.   Female-pattern baldness or thinning hair.   Patches of thickened and dark brown or black skin on the neck, arms, breasts, or thighs.   Tiny excess flaps of skin (skin tags) in the armpits or neck area.   Excessive snoring and having breathing stop at times while asleep (sleep apnea).   Deepening of the voice.   Gestational diabetes when pregnant.  DIAGNOSIS  There is no single test to diagnose PCOS.   Your health care provider will:   Take a medical history.   Perform a pelvic exam.   Have ultrasonography done.   Check your female and female hormone levels.   Measure glucose or sugar levels in the blood.   Do other blood tests.   If you are producing too many female hormones, your health care provider will make sure it is from PCOS. At the physical exam, your health care provider will want to evaluate the areas of increased hair growth. Try to allow natural hair growth for a few days before the visit.   During a pelvic exam, the ovaries may be enlarged  or swollen because of the increased number of small cysts. This can be seen more easily by using vaginal ultrasonography or screening to examine the ovaries and lining of the uterus (endometrium) for cysts. The uterine lining may become thicker if you have not been having a regular period.  TREATMENT  Because there is no cure for PCOS, it needs to be managed to prevent problems. Treatments are based on your symptoms. Treatment is also based on whether you want to have a baby or whether you need contraception.  Treatment may include:   Progesterone hormone to start a menstrual period.   Birth control pills to make you have regular menstrual periods.   Medicines to make you ovulate, if you want to get pregnant.   Medicines to control your insulin.   Medicine to control your blood pressure.   Medicine and diet to control your high cholesterol and triglycerides in your blood.  Medicine to reduce excessive hair growth.  Surgery, making small holes in the ovary, to decrease the amount of female hormone production. This is done through a long, lighted tube (laparoscope) placed into the pelvis through a tiny incision in the lower abdomen.  HOME CARE INSTRUCTIONS  Only take over-the-counter or prescription medicine as directed by your health care provider.  Pay attention to the foods you eat and your activity levels. This can help reduce the effects of PCOS.  Keep your weight under control.  Eat foods that are   low in carbohydrate and high in fiber.  Exercise regularly. SEEK MEDICAL CARE IF:  Your symptoms do not get better with medicine.  You have new symptoms. Document Released: 12/22/2004 Document Revised: 06/18/2013 Document Reviewed: 02/13/2013 ExitCare Patient Information 2014 [employer].  

## 2013-12-18 NOTE — Progress Notes (Signed)
Subjective:     Patient ID: Jasmine Castaneda, female   DOB: August 22, 1981, 33 y.o.   MRN: [id number]  HPIPt presents for amenorrhea.  She has a longtime h/o irreg cycles however, this time her cycle is delayed since 08/2014.  She desires conception.  She reports prev being on Clomid, Metformin and Provera.      Review of Systems     Objective:   Physical Exam BP 132/92  Pulse 81  Temp(Src) 97.7 F (36.5 C) (Oral)  Ht 5\' 8"  (1.727 m)  Wt 164 lb (74.39 kg)  BMI 24.94 kg/m2 Exam deferred     Assessment:     Amenorrhea h/o PCOS      UPT: neg Plan:     Provera 10 day s1-5 of cycles (pt wants monthly cycle) Metformin 1000 bid (may begin at 500mg  bid) F/u in 6 months for annual GYN   I explained to the pt that our of[facility] no longer offers infertility care

## 2014-02-24 ENCOUNTER — Emergency Department (HOSPITAL_BASED_OUTPATIENT_CLINIC_OR_DEPARTMENT_OTHER)
Admission: EM | Admit: 2014-02-24 | Discharge: 2014-02-24 | Disposition: A | Discharge status: Home / Self Care | Payer: Medicaid Other | Attending: Emergency Medicine | Admitting: Emergency Medicine

## 2014-02-24 ENCOUNTER — Encounter (HOSPITAL_BASED_OUTPATIENT_CLINIC_OR_DEPARTMENT_OTHER): Payer: Self-pay | Admitting: Emergency Medicine

## 2014-02-24 DIAGNOSIS — H109 Unspecified conjunctivitis: Secondary | ICD-10-CM | POA: Insufficient documentation

## 2014-02-24 DIAGNOSIS — Z79899 Other long term (current) drug therapy: Secondary | ICD-10-CM | POA: Insufficient documentation

## 2014-02-24 DIAGNOSIS — Z8742 Personal history of other diseases of the female genital tract: Secondary | ICD-10-CM | POA: Insufficient documentation

## 2014-02-24 DIAGNOSIS — E282 Polycystic ovarian syndrome: Secondary | ICD-10-CM | POA: Insufficient documentation

## 2014-02-24 MED ORDER — HYDROCODONE-ACETAMINOPHEN 5-325 MG PO TABS: 2.0000 | ORAL_TABLET | ORAL | Status: DC | PRN

## 2014-02-24 MED ORDER — TOBRAMYCIN 0.3 % OP SOLN: 2.0000 [drp] | OPHTHALMIC | Status: DC

## 2014-02-24 NOTE — ED Provider Notes (Signed)
CSN: [id number]     Arrival date & time 02/24/14  2019 History   First MD Initiated Contact with Patient 02/24/14 2035     Chief Complaint  Patient presents with  . Eye Drainage     (Consider location/radiation/quality/duration/timing/severity/associated sxs/prior Treatment) Patient is a 33 y.o. female presenting with eye pain. The history is provided by the patient. No language interpreter was used.  Eye Pain This is a new problem. The current episode started yesterday. The problem occurs constantly. The problem has been gradually worsening. Nothing aggravates the symptoms. She has tried nothing for the symptoms. The treatment provided moderate relief.  Pt reports yesterday she began having drainage from her eye.  Pt reports today she hs swelling and a lump to eyelid  Past Medical History  Diagnosis Date  . Abnormal Pap smear     leep  . Infertility   . PCOS (polycystic ovarian syndrome)    Past Surgical History  Procedure Laterality Date  . Leep     Family History  Problem Relation Age of Onset  . Hypertension Father   . Cancer Mother    History  Substance Use Topics  . Smoking status: Never Smoker   . Smokeless tobacco: Never Used  . Alcohol Use: No   OB History   Grav Para Term Preterm Abortions TAB SAB Ect Mult Living   1 1  1  0 0 0 0 0 1     Review of Systems  Eyes: Positive for pain.  All other systems reviewed and are negative.     Allergies  Septra  Home Medications   Prior to Admission medications   Medication Sig Start Date End Date Taking? Authorizing Provider  Boric Acid POWD Use 1 capsule per vagina at bedtime daily for 3 weeks, then 1 capsule per vagina at bedtime twice a week for 6 months. 05/01/13   Deirdre Colin Mulders Poe, CNM  Cetirizine HCl (ZYRTEC PO) Take 1 tablet by mouth daily as needed. Patient used this medication for allergy symptoms.    Historical Provider, MD  EPINEPHrine (EPIPEN) 0.3 mg/0.3 mL DEVI Inject 0.3 mLs (0.3 mg total) into the  muscle as needed. 07/08/11   Rolan BuccoMelanie Belfi, MD  medroxyPROGESTERone (PROVERA) 10 MG tablet Take 1 tablet (10 mg total) by mouth daily. For 5 days per month (days 1-5 of cycle) 12/18/13   Willodean Rosenthalarolyn Harraway-Smith, MD  metFORMIN (GLUCOPHAGE) 500 MG tablet Take 2 tablets (1,000 mg total) by mouth 2 (two) times daily with a meal. 12/18/13   Willodean Rosenthalarolyn Harraway-Smith, MD   BP 128/87  Pulse 82  Temp(Src) 98.2 F (36.8 C) (Oral)  Resp 18  Ht 5\' 9"  (1.753 m)  Wt 165 lb (74.844 kg)  BMI 24.36 kg/m2  SpO2 100%  LMP 01/27/2014 Physical Exam  Nursing note and vitals reviewed. Constitutional: She appears well-developed and well-nourished.  HENT:  Head: Normocephalic.  Eyes: Pupils are equal, round, and reactive to light.  Injected conjunctiva,  Drainage,  Swelling lower eyelid  Neck: Normal range of motion.  Cardiovascular: Normal rate.   Pulmonary/Chest: Effort normal.  Musculoskeletal: Normal range of motion.  Skin: Skin is warm.    ED Course  Procedures (including critical care time) Labs Review Labs Reviewed - No data to display  Imaging Review No results found.   EKG Interpretation None      MDM   Final diagnoses:  Conjunctivitis    Hydrocodone tobrex  48 hour recheck if not improved/resolving    Elson AreasLeslie K Sofia, PA-C  02/24/14 2056 

## 2014-02-24 NOTE — Discharge Instructions (Signed)
Conjunctivitis °Conjunctivitis is commonly called "pink eye." Conjunctivitis can be caused by bacterial or viral infection, allergies, or injuries. There is usually redness of the lining of the eye, itching, discomfort, and sometimes discharge. There may be deposits of matter along the eyelids. A viral infection usually causes a watery discharge, while a bacterial infection causes a yellowish, thick discharge. Pink eye is very contagious and spreads by direct contact. °You may be given antibiotic eyedrops as part of your treatment. Before using your eye medicine, remove all drainage from the eye by washing gently with warm water and cotton balls. Continue to use the medication until you have awakened 2 mornings in a row without discharge from the eye. Do not rub your eye. This increases the irritation and helps spread infection. Use separate towels from other household members. Wash your hands with soap and water before and after touching your eyes. Use cold compresses to reduce pain and sunglasses to relieve irritation from light. Do not wear contact lenses or wear eye makeup until the infection is gone. °SEEK MEDICAL CARE IF:  °· Your symptoms are not better after 3 days of treatment. °· You have increased pain or trouble seeing. °· The outer eyelids become very red or swollen. °Document Released: 10/05/2004 Document Revised: 11/20/2011 Document Reviewed: 08/28/2005 °ExitCare® Patient Information ©2014 [employer]. ° °Sty °A sty (hordeolum) is an infection of a gland in the eyelid located at the base of the eyelash. A sty may develop a white or yellow head of pus. It can be puffy (swollen). Usually, the sty will burst and pus will come out on its own. They do not leave lumps in the eyelid once they drain. °A sty is often confused with another form of cyst of the eyelid called a chalazion. Chalazions occur within the eyelid and not on the edge where the bases of the eyelashes are. They often are red, sore and  then form firm lumps in the eyelid. °CAUSES  °· Germs (bacteria). °· Lasting (chronic) eyelid inflammation. °SYMPTOMS  °· Tenderness, redness and swelling along the edge of the eyelid at the base of the eyelashes. °· Sometimes, there is a white or yellow head of pus. It may or may not drain. °DIAGNOSIS  °An ophthalmologist will be able to distinguish between a sty and a chalazion and treat the condition appropriately.  °TREATMENT  °· Styes are typically treated with warm packs (compresses) until drainage occurs. °· In rare cases, medicines that kill germs (antibiotics) may be prescribed. These antibiotics may be in the form of drops, cream or pills. °· If a hard lump has formed, it is generally necessary to do a small incision and remove the hardened contents of the cyst in a minor surgical procedure done in the office. °· In suspicious cases, your caregiver may send the contents of the cyst to the lab to be certain that it is not a rare, but dangerous form of cancer of the glands of the eyelid. °HOME CARE INSTRUCTIONS  °· Wash your hands often and dry them with a clean towel. Avoid touching your eyelid. This may spread the infection to other parts of the eye. °· Apply heat to your eyelid for 10 to 20 minutes, several times a day, to ease pain and help to heal it faster. °· Do not squeeze the sty. Allow it to drain on its own. Wash your eyelid carefully 3 to 4 times per day to remove any pus. °SEEK IMMEDIATE MEDICAL CARE IF:  °· Your   eye becomes painful or puffy (swollen). °· Your vision changes. °· Your sty does not drain by itself within 3 days. °· Your sty comes back within a short period of time, even with treatment. °· You have redness (inflammation) around the eye. °· You have a fever. °Document Released: 06/07/2005 Document Revised: 11/20/2011 Document Reviewed: 02/09/2009 °ExitCare® Patient Information ©2014 [employer]. ° °

## 2014-02-24 NOTE — ED Provider Notes (Signed)
Medical screening examination/treatment/procedure(s) were performed by non-physician practitioner and as supervising physician I was immediately available for consultation/collaboration.   EKG Interpretation None        Courtney F Horton, MD 02/24/14 2336 

## 2014-02-24 NOTE — ED Notes (Signed)
C/o d/c and swelling-started yesterday

## 2014-03-16 ENCOUNTER — Other Ambulatory Visit: Payer: Self-pay | Admitting: Obstetrics & Gynecology

## 2014-04-07 ENCOUNTER — Telehealth: Payer: Self-pay | Admitting: *Deleted

## 2014-04-07 MED ORDER — FLUCONAZOLE 150 MG PO TABS: ORAL_TABLET | ORAL | Status: DC

## 2014-04-07 NOTE — Telephone Encounter (Signed)
Pt called nurse line requesting to be placed on cancellation list to be seen earlier for vaginal dryness.  Contacted patient, informed patient we would call her if we can with an appointment earlier that the one scheduled, however more than likely we will not have one earlier.  Pt describes having an yeast infection and request Diflucan.  Diflucan ordered/sent to her pharmacy. Pt to continue treatment per plan of Dr. Erin FullingHarraway Smith until visit.  Phone number updated.

## 2014-04-22 ENCOUNTER — Encounter: Payer: Self-pay | Admitting: Obstetrics & Gynecology

## 2014-04-22 ENCOUNTER — Other Ambulatory Visit (HOSPITAL_COMMUNITY)
Admission: RE | Admit: 2014-04-22 | Discharge: 2014-04-22 | Disposition: A | Discharge status: Home / Self Care | Payer: Medicaid Other | Source: Ambulatory Visit | Attending: Obstetrics & Gynecology | Admitting: Obstetrics & Gynecology

## 2014-04-22 ENCOUNTER — Telehealth: Payer: Self-pay | Admitting: General Practice

## 2014-04-22 ENCOUNTER — Ambulatory Visit (INDEPENDENT_AMBULATORY_CARE_PROVIDER_SITE_OTHER): Payer: Medicaid Other | Admitting: Obstetrics & Gynecology

## 2014-04-22 VITALS — BP 114/78 | HR 74 | Temp 98.6°F | Ht 68.0 in | Wt 164.3 lb

## 2014-04-22 DIAGNOSIS — Z1151 Encounter for screening for human papillomavirus (HPV): Secondary | ICD-10-CM | POA: Insufficient documentation

## 2014-04-22 DIAGNOSIS — Z113 Encounter for screening for infections with a predominantly sexual mode of transmission: Secondary | ICD-10-CM | POA: Insufficient documentation

## 2014-04-22 DIAGNOSIS — N912 Amenorrhea, unspecified: Secondary | ICD-10-CM

## 2014-04-22 DIAGNOSIS — E282 Polycystic ovarian syndrome: Secondary | ICD-10-CM | POA: Diagnosis not present

## 2014-04-22 DIAGNOSIS — A499 Bacterial infection, unspecified: Secondary | ICD-10-CM

## 2014-04-22 DIAGNOSIS — Z0000 Encounter for general adult medical examination without abnormal findings: Secondary | ICD-10-CM

## 2014-04-22 DIAGNOSIS — Z01419 Encounter for gynecological examination (general) (routine) without abnormal findings: Secondary | ICD-10-CM

## 2014-04-22 DIAGNOSIS — B373 Candidiasis of vulva and vagina: Secondary | ICD-10-CM

## 2014-04-22 DIAGNOSIS — B3731 Acute candidiasis of vulva and vagina: Secondary | ICD-10-CM

## 2014-04-22 DIAGNOSIS — N760 Acute vaginitis: Secondary | ICD-10-CM

## 2014-04-22 DIAGNOSIS — B9689 Other specified bacterial agents as the cause of diseases classified elsewhere: Secondary | ICD-10-CM

## 2014-04-22 LAB — COMPREHENSIVE METABOLIC PANEL
ALBUMIN: 4.5 g/dL (ref 3.5–5.2)
ALT: 13 U/L (ref 0–35)
AST: 17 U/L (ref 0–37)
Alkaline Phosphatase: 31 U/L — ABNORMAL LOW (ref 39–117)
BILIRUBIN TOTAL: 0.5 mg/dL (ref 0.2–1.2)
BUN: 12 mg/dL (ref 6–23)
CO2: 28 mEq/L (ref 19–32)
Calcium: 9.9 mg/dL (ref 8.4–10.5)
Chloride: 105 mEq/L (ref 96–112)
Creat: 0.91 mg/dL (ref 0.50–1.10)
Glucose, Bld: 86 mg/dL (ref 70–99)
Potassium: 4.6 mEq/L (ref 3.5–5.3)
SODIUM: 139 meq/L (ref 135–145)
TOTAL PROTEIN: 6.9 g/dL (ref 6.0–8.3)

## 2014-04-22 LAB — WET PREP, GENITAL
Trich, Wet Prep: NONE SEEN
WBC, Wet Prep HPF POC: NONE SEEN
Yeast Wet Prep HPF POC: NONE SEEN

## 2014-04-22 MED ORDER — METFORMIN HCL 850 MG PO TABS: 850.0000 mg | ORAL_TABLET | Freq: Two times a day (BID) | ORAL | Status: DC

## 2014-04-22 MED ORDER — FLUCONAZOLE 150 MG PO TABS: ORAL_TABLET | ORAL | Status: DC

## 2014-04-22 NOTE — Progress Notes (Signed)
Some medication she has discontinued for a few weeks but listed as taking so they would not be discontinued on list.

## 2014-04-22 NOTE — Telephone Encounter (Signed)
Patient called to front office stating for a couple weeks now she has had pain right on the inside of her lips, not in her vagina but on the outside and the pain has just got worse and worse and now it feels like there is a mass there, there isn't but it feels like it underneath the skin and it is quite painful now and she really doesn't want to go to the ER for this. Asked patient if she could come today at 3 to be seen. Patient verbalized understanding and stated she could come then. Patient knows if she is more than 20 minutes late she will not be seen and if she misses this appt we will not have another one for a while. Patient had no other questions

## 2014-04-22 NOTE — Patient Instructions (Signed)
Polycystic Ovarian Syndrome Polycystic ovarian syndrome (PCOS) is a common hormonal disorder among women of reproductive age. Most women with PCOS grow many small cysts on their ovaries. PCOS can cause problems with your periods and make it difficult to get pregnant. It can also cause an increased risk of miscarriage with pregnancy. If left untreated, PCOS can lead to serious health problems, such as diabetes and heart disease. CAUSES The cause of PCOS is not fully understood, but genetics may be a factor. SIGNS AND SYMPTOMS   Infrequent or no menstrual periods.   Inability to get pregnant (infertility) because of not ovulating.   Increased growth of hair on the face, chest, stomach, back, thumbs, thighs, or toes.   Acne, oily skin, or dandruff.   Pelvic pain.   Weight gain or obesity, usually carrying extra weight around the waist.   Type 2 diabetes.   High cholesterol.   High blood pressure.   Female-pattern baldness or thinning hair.   Patches of thickened and dark brown or black skin on the neck, arms, breasts, or thighs.   Tiny excess flaps of skin (skin tags) in the armpits or neck area.   Excessive snoring and having breathing stop at times while asleep (sleep apnea).   Deepening of the voice.   Gestational diabetes when pregnant.  DIAGNOSIS  There is no single test to diagnose PCOS.   Your health care provider will:   Take a medical history.   Perform a pelvic exam.   Have ultrasonography done.   Check your female and female hormone levels.   Measure glucose or sugar levels in the blood.   Do other blood tests.   If you are producing too many female hormones, your health care provider will make sure it is from PCOS. At the physical exam, your health care provider will want to evaluate the areas of increased hair growth. Try to allow natural hair growth for a few days before the visit.   During a pelvic exam, the ovaries may be enlarged  or swollen because of the increased number of small cysts. This can be seen more easily by using vaginal ultrasonography or screening to examine the ovaries and lining of the uterus (endometrium) for cysts. The uterine lining may become thicker if you have not been having a regular period.  TREATMENT  Because there is no cure for PCOS, it needs to be managed to prevent problems. Treatments are based on your symptoms. Treatment is also based on whether you want to have a baby or whether you need contraception.  Treatment may include:   Progesterone hormone to start a menstrual period.   Birth control pills to make you have regular menstrual periods.   Medicines to make you ovulate, if you want to get pregnant.   Medicines to control your insulin.   Medicine to control your blood pressure.   Medicine and diet to control your high cholesterol and triglycerides in your blood.  Medicine to reduce excessive hair growth.  Surgery, making small holes in the ovary, to decrease the amount of female hormone production. This is done through a long, lighted tube (laparoscope) placed into the pelvis through a tiny incision in the lower abdomen.  HOME CARE INSTRUCTIONS  Only take over-the-counter or prescription medicine as directed by your health care provider.  Pay attention to the foods you eat and your activity levels. This can help reduce the effects of PCOS.  Keep your weight under control.  Eat foods that are   low in carbohydrate and high in fiber.  Exercise regularly. SEEK MEDICAL CARE IF:  Your symptoms do not get better with medicine.  You have new symptoms. Document Released: 12/22/2004 Document Revised: 06/18/2013 Document Reviewed: 02/13/2013 [facility] Patient Information 2015 [place], [place]. This information is not intended to replace advice given to you by your health care provider. Make sure you discuss any questions you have with your health care provider.Thank you for  enrolling in MyChart. Please follow the instructions below to securely access your online medical record. MyChart allows you to send messages to your doctor, view your test results, manage appointments, and more.   How Do I Sign Up? 1. In your Internet browser, go to [employer] Address [employer] and enter [email or web address]. 2. Click on the Sign Up Now link in the Sign In box. You will see the New Member Sign Up page. 3. Enter your MyChart Access Code exactly as it appears below. You will not need to use this code after you've completed the sign-up process. If you do not sign up before the expiration date, you must request a new code.  MyChart Access Code: [email or web address] Expires: 04/25/2014  8:55 PM  4. Enter your Social Security Number ([id number]) and Date of Birth (mm/dd/yyyy) as indicated and click Submit. You will be taken to the next sign-up page. 5. Create a MyChart ID. This will be your MyChart login ID and cannot be changed, so think of one that is secure and easy to remember. 6. Create a MyChart password. You can change your password at any time. 7. Enter your Password Reset Question and Answer. This can be used at a later time if you forget your password.  8. Enter your e-mail address. You will receive e-mail notification when new information is available in MyChart. 9. Click Sign Up. You can now view your medical record.   Additional Information Remember, MyChart is NOT to be used for urgent needs. For medical emergencies, dial 911.

## 2014-04-22 NOTE — Progress Notes (Signed)
    GYNECOLOGY CLINIC ANNUAL PREVENTATIVE CARE ENCOUNTER NOTE  Subjective:     Jasmine Castaneda is a 33 y.o. 681P0101 female here for a new patient gyn visit.  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  She has been taking metformin for her PCOS in hopes to conceive with no success for a year.  Her vaginal discharge is described as watery and foul smelling, causing irritation.  She voiced concern about an abscess that is small and on her left vulva.  In addition to this she did not remember the last time she had a pap test but reports having a LEEP procedure in the past.   Gynecologic History Patient's last menstrual period was 03/01/2014. Contraception: None, patient desires pregnancy Last Pap: Unknown. Results were: Unknown Last mammogram: N/A. Results were: N/A  Obstetric History OB History  Gravida Para Term Preterm AB SAB TAB Ectopic Multiple Living  1 1  1  0 0 0 0 0 1    # Outcome Date GA Lbr Len/2nd Weight Sex Delivery Anes PTL Lv  1 PRE 11/2004 [id number]   F SVD EPI       Comments: Induced for pain/edema      The following portions of the patient's history were reviewed and updated as appropriate: allergies, current medications, past family history, past medical history, past social history, past surgical history and problem list.  Review of Systems Pertinent items are noted in HPI.    Objective:   BP 114/78  Pulse 74  Temp(Src) 98.6 F (37 C)  Ht 5\' 8"  (1.727 m)  Wt 164 lb 4.8 oz (74.526 kg)  BMI 24.99 kg/m2  LMP 03/01/2014 GENERAL: Well-developed, well-nourished female in no acute distress.  HEENT: Normocephalic, atraumatic. Sclerae anicteric.  NECK: Supple. Normal thyroid.  LUNGS: Clear to auscultation bilaterally.  HEART: Regular rate and rhythm. BREASTS: Symmetric in size. No masses, skin changes, nipple drainage, or lymphadenopathy. ABDOMEN: Soft, nontender,  nondistended. No organomegaly. PELVIC: Normal external female genitalia. Vagina is pink and rugated.  Normal discharge. Normal cervix contour. Pap smear obtained. Uterus is normal in size. No adnexal mass or tenderness. Some tenderness in Left vulva near bartholan's cyst area.  No mass seen or palpated. EXTREMITIES: No cyanosis, clubbing, or edema, 2+ distal pulses.   Assessment:   Annual gynecologic examination PCOS with amenorrhea   Plan:   Pap done, will follow up results and manage accordingly. Routine preventative health maintenance measures emphasized Metformin 850 mg BID prescribed. Diflucan prescribed. Recommended warm compress for possible Bartholin's cyst. Provided reassurance and guidance re: adherence with Metformin regimen. Follow up in 3 months for PCOS  CHRISTOPHER MERTZ, PA-S   Attestation of Attending Supervision of Physician Assistant Student: Evaluation and management procedures were performed by the PA Student under my supervision.  I have seen and examined the patient, reviewed the the student's note and chart, and I agree with management and plan.  Jaynie CollinsUGONNA  Branae Crail, MD, FACOG Attending Obstetrician & Gynecologist Faculty Practice, [facility]

## 2014-04-23 ENCOUNTER — Telehealth: Payer: Self-pay | Admitting: General Practice

## 2014-04-23 LAB — RPR

## 2014-04-23 LAB — HIV ANTIBODY (ROUTINE TESTING W REFLEX): HIV 1&2 Ab, 4th Generation: NONREACTIVE

## 2014-04-23 LAB — HEPATITIS C ANTIBODY: HCV Ab: NEGATIVE

## 2014-04-23 MED ORDER — METRONIDAZOLE 500 MG PO TABS: 500.0000 mg | ORAL_TABLET | Freq: Two times a day (BID) | ORAL | Status: DC

## 2014-04-23 MED ORDER — CLINDAMYCIN HCL 300 MG PO CAPS: 300.0000 mg | ORAL_CAPSULE | Freq: Two times a day (BID) | ORAL | Status: DC

## 2014-04-23 NOTE — Telephone Encounter (Signed)
Called patient and informed her of results/blood work results and medication available for pickup at pharmacy. Discussed with patient she currently does not have a yeast infection however if she develops one after completing antibiotics she can pick up the diflucan Rx as well. Patient verbalized understanding to all and had no other questions

## 2014-04-23 NOTE — Telephone Encounter (Signed)
Message copied by Kathee DeltonHILLMAN, Tyke Outman L on Thu Apr 23, 2014  3:59 PM ------      Message from: Jaynie CollinsANYANWU, UGONNA A      Created: Thu Apr 23, 2014 10:43 AM       Wet prep showed clue cells consistent with BV, Clindamycin e-prescribed (Metronidazole allergy). Please call to inform patient of results and advise her to pick up prescription. She does not have a yeast infection currently, but may get one after antibiotic therapy and still may need previously prescribed Diflucan.       ------

## 2014-04-23 NOTE — Addendum Note (Signed)
Addended by: Jaynie CollinsANYANWU, UGONNA A on: 04/23/2014 10:40 AM   Modules accepted: Orders

## 2014-04-27 LAB — CYTOLOGY - PAP

## 2014-05-04 ENCOUNTER — Telehealth: Payer: Self-pay | Admitting: *Deleted

## 2014-05-04 DIAGNOSIS — N760 Acute vaginitis: Principal | ICD-10-CM

## 2014-05-04 DIAGNOSIS — B9689 Other specified bacterial agents as the cause of diseases classified elsewhere: Secondary | ICD-10-CM

## 2014-05-04 MED ORDER — CLINDAMYCIN HCL 300 MG PO CAPS: 300.0000 mg | ORAL_CAPSULE | Freq: Two times a day (BID) | ORAL | Status: DC

## 2014-05-04 NOTE — Telephone Encounter (Signed)
Pt left message requesting refill of Clindamycin for BV. She states she mistook the medication. I called pt and discussed her concern. She states that she took 5 days worth of medication then went out of town and forgot the medicine. When she returned 3 days later, she took the remaining pills (5).  She is still having most of the same sx as when she was diagnosed with BV - malodorous vaginal d/c.  I discussed pt status with Nada Maclachlan, PA and refill was approved.  Pt was advised that if her sx persist after this course of treatment, she will require re-evaluation in our office.  Pt voiced understanding.

## 2014-05-06 ENCOUNTER — Ambulatory Visit: Payer: Medicaid Other | Admitting: Obstetrics & Gynecology

## 2014-07-06 ENCOUNTER — Encounter (HOSPITAL_BASED_OUTPATIENT_CLINIC_OR_DEPARTMENT_OTHER): Payer: Self-pay | Admitting: Emergency Medicine

## 2014-07-06 ENCOUNTER — Emergency Department (HOSPITAL_BASED_OUTPATIENT_CLINIC_OR_DEPARTMENT_OTHER)
Admission: EM | Admit: 2014-07-06 | Discharge: 2014-07-06 | Disposition: A | Discharge status: Home / Self Care | Payer: Medicaid Other | Attending: Emergency Medicine | Admitting: Emergency Medicine

## 2014-07-06 DIAGNOSIS — R05 Cough: Secondary | ICD-10-CM | POA: Diagnosis present

## 2014-07-06 DIAGNOSIS — Z8639 Personal history of other endocrine, nutritional and metabolic disease: Secondary | ICD-10-CM | POA: Diagnosis not present

## 2014-07-06 DIAGNOSIS — Z79899 Other long term (current) drug therapy: Secondary | ICD-10-CM | POA: Insufficient documentation

## 2014-07-06 DIAGNOSIS — J069 Acute upper respiratory infection, unspecified: Secondary | ICD-10-CM | POA: Diagnosis not present

## 2014-07-06 DIAGNOSIS — B349 Viral infection, unspecified: Secondary | ICD-10-CM | POA: Insufficient documentation

## 2014-07-06 DIAGNOSIS — H9209 Otalgia, unspecified ear: Secondary | ICD-10-CM | POA: Insufficient documentation

## 2014-07-06 DIAGNOSIS — Z792 Long term (current) use of antibiotics: Secondary | ICD-10-CM | POA: Insufficient documentation

## 2014-07-06 MED ORDER — CETIRIZINE-PSEUDOEPHEDRINE ER 5-120 MG PO TB12
1.0000 | ORAL_TABLET | Freq: Every day | ORAL | Status: DC
Start: 2014-07-06 — End: 2014-10-07

## 2014-07-06 MED ORDER — ACETAMINOPHEN-CODEINE 120-12 MG/5ML PO SUSP: 5.0000 mL | Freq: Four times a day (QID) | ORAL | Status: DC | PRN

## 2014-07-06 NOTE — ED Notes (Signed)
Pt c/o uri sxs today. "Scratchy" throat and right ear ache. Denies fever, chills, n/v/d.

## 2014-07-06 NOTE — Discharge Instructions (Signed)

## 2014-07-06 NOTE — ED Provider Notes (Signed)
CSN: [id number]     Arrival date & time 07/06/14  1020 History   First MD Initiated Contact with Patient 07/06/14 1028     Chief Complaint  Patient presents with  . URI  . Otalgia     (Consider location/radiation/quality/duration/timing/severity/associated sxs/prior Treatment) Patient is a 33 y.o. female presenting with URI and ear pain. The history is provided by the patient.  URI Presenting symptoms: congestion, cough and ear pain   Presenting symptoms: no fever   Severity:  Moderate Onset quality:  Sudden Duration:  1 day Timing:  Constant Progression:  Unchanged Chronicity:  New Relieved by:  Nothing Worsened by:  Nothing tried Ineffective treatments:  None tried Associated symptoms: myalgias   Otalgia Associated symptoms: congestion and cough   Associated symptoms: no fever     Past Medical History  Diagnosis Date  . Abnormal Pap smear     leep  . Infertility   . PCOS (polycystic ovarian syndrome)    Past Surgical History  Procedure Laterality Date  . Leep     Family History  Problem Relation Age of Onset  . Hypertension Father   . Cancer Mother    History  Substance Use Topics  . Smoking status: Never Smoker   . Smokeless tobacco: Never Used  . Alcohol Use: No   OB History   Grav Para Term Preterm Abortions TAB SAB Ect Mult Living   1 1  1  0 0 0 0 0 1     Review of Systems  Constitutional: Negative for fever.  HENT: Positive for congestion and ear pain.   Respiratory: Positive for cough.   Cardiovascular: Negative.   Musculoskeletal: Positive for myalgias.      Allergies  Septra and Flagyl  Home Medications   Prior to Admission medications   Medication Sig Start Date End Date Taking? Authorizing Provider  acetaminophen-codeine 120-12 MG/5ML suspension Take 5 mLs by mouth every 6 (six) hours as needed for pain. 07/06/14   Teressa LowerVrinda Sitlali Koerner, NP  Boric Acid POWD Use 1 capsule per vagina at bedtime daily for 3 weeks, then 1 capsule per  vagina at bedtime twice a week for 6 months. 05/01/13   Deirdre Colin Mulders Poe, CNM  Cetirizine HCl (ZYRTEC PO) Take 1 tablet by mouth daily as needed. Patient used this medication for allergy symptoms.    Historical Provider, MD  cetirizine-pseudoephedrine (ZYRTEC-D) 5-120 MG per tablet Take 1 tablet by mouth daily. 07/06/14   Teressa LowerVrinda Va Broadwell, NP  clindamycin (CLEOCIN) 300 MG capsule Take 1 capsule (300 mg total) by mouth 2 (two) times daily. 05/04/14   Bertram DenverKaren E Teague Clark, PA-C  EPINEPHrine (EPIPEN) 0.3 mg/0.3 mL DEVI Inject 0.3 mLs (0.3 mg total) into the muscle as needed. 07/08/11   Rolan BuccoMelanie Belfi, MD  fluconazole (DIFLUCAN) 150 MG tablet TAKE 1 TABLET BY MOUTH ONCE. CAN REPEAT IN THREE DAYS IF SYMPTOMS PERSIST. 04/22/14   Tereso NewcomerUgonna A Anyanwu, MD  HYDROcodone-acetaminophen (NORCO/VICODIN) 5-325 MG per tablet Take 2 tablets by mouth every 4 (four) hours as needed. 02/24/14   Elson AreasLeslie K Sofia, PA-C  medroxyPROGESTERone (PROVERA) 10 MG tablet Take 1 tablet (10 mg total) by mouth daily. For 5 days per month (days 1-5 of cycle) 12/18/13   Willodean Rosenthalarolyn Harraway-Smith, MD  metFORMIN (GLUCOPHAGE) 850 MG tablet Take 1 tablet (850 mg total) by mouth 2 (two) times daily with a meal. 04/22/14   Jethro BastosUgonna A Anyanwu, MD  tobramycin (TOBREX) 0.3 % ophthalmic solution Place 2 drops into the right eye every 4 (  four) hours. 02/24/14   Elson AreasLeslie K Sofia, PA-C   BP 133/93  Pulse 63  Temp(Src) 97.9 F (36.6 C) (Oral)  Resp 20  Ht 5\' 8"  (1.727 m)  Wt 165 lb (74.844 kg)  BMI 25.09 kg/m2  SpO2 100% Physical Exam  Nursing note and vitals reviewed. Constitutional: She appears well-developed and well-nourished.  HENT:  Right Ear: External ear normal.  Left Ear: External ear normal.  Nose: Rhinorrhea present.  Mouth/Throat: Posterior oropharyngeal erythema present.  Eyes: Conjunctivae and EOM are normal. Pupils are equal, round, and reactive to light.  Neck: Normal range of motion. Neck supple.  Cardiovascular: Normal rate and regular  rhythm.   Pulmonary/Chest: Effort normal and breath sounds normal.  Musculoskeletal: Normal range of motion.  Neurological: She is alert.  Skin: Skin is warm and dry.    ED Course  Procedures (including critical care time) Labs Review Labs Reviewed - No data to display  Imaging Review No results found.   EKG Interpretation None      MDM   Final diagnoses:  URI (upper respiratory infection)  Viral illness    Likely viral in nature. Non septic in appearance. Will treat symptomatically    Teressa LowerVrinda Helmi Hechavarria, NP 07/06/14 1056

## 2014-07-06 NOTE — ED Provider Notes (Signed)
Medical screening examination/treatment/procedure(s) were performed by non-physician practitioner and as supervising physician I was immediately available for consultation/collaboration.   EKG Interpretation None        Gwyneth SproutWhitney Armella Stogner, MD 07/06/14 1318

## 2014-07-13 ENCOUNTER — Encounter (HOSPITAL_BASED_OUTPATIENT_CLINIC_OR_DEPARTMENT_OTHER): Payer: Self-pay | Admitting: Emergency Medicine

## 2014-07-21 ENCOUNTER — Other Ambulatory Visit: Payer: Self-pay | Admitting: Obstetrics & Gynecology

## 2014-10-07 ENCOUNTER — Encounter: Payer: Self-pay | Admitting: Obstetrics & Gynecology

## 2014-10-07 ENCOUNTER — Ambulatory Visit (INDEPENDENT_AMBULATORY_CARE_PROVIDER_SITE_OTHER): Payer: Medicaid Other | Admitting: Obstetrics & Gynecology

## 2014-10-07 ENCOUNTER — Other Ambulatory Visit: Payer: Self-pay | Admitting: Obstetrics & Gynecology

## 2014-10-07 VITALS — BP 123/86 | HR 74 | Temp 98.3°F | Ht 67.0 in | Wt 164.4 lb

## 2014-10-07 DIAGNOSIS — N898 Other specified noninflammatory disorders of vagina: Secondary | ICD-10-CM

## 2014-10-07 DIAGNOSIS — N86 Erosion and ectropion of cervix uteri: Secondary | ICD-10-CM

## 2014-10-07 DIAGNOSIS — N970 Female infertility associated with anovulation: Secondary | ICD-10-CM

## 2014-10-07 DIAGNOSIS — N930 Postcoital and contact bleeding: Secondary | ICD-10-CM

## 2014-10-07 DIAGNOSIS — R102 Pelvic and perineal pain: Secondary | ICD-10-CM

## 2014-10-07 DIAGNOSIS — G8929 Other chronic pain: Secondary | ICD-10-CM

## 2014-10-07 DIAGNOSIS — E282 Polycystic ovarian syndrome: Secondary | ICD-10-CM

## 2014-10-07 DIAGNOSIS — N949 Unspecified condition associated with female genital organs and menstrual cycle: Secondary | ICD-10-CM

## 2014-10-07 LAB — POCT PREGNANCY, URINE: PREG TEST UR: NEGATIVE

## 2014-10-07 MED ORDER — FLUCONAZOLE 150 MG PO TABS: 150.0000 mg | ORAL_TABLET | Freq: Once | ORAL | Status: DC

## 2014-10-07 MED ORDER — METFORMIN HCL 850 MG PO TABS: 850.0000 mg | ORAL_TABLET | Freq: Two times a day (BID) | ORAL | Status: DC

## 2014-10-07 MED ORDER — CLOMIPHENE CITRATE 50 MG PO TABS: 50.0000 mg | ORAL_TABLET | Freq: Every day | ORAL | Status: DC

## 2014-10-07 MED ORDER — MEDROXYPROGESTERONE ACETATE 10 MG PO TABS: 10.0000 mg | ORAL_TABLET | Freq: Every day | ORAL | Status: DC

## 2014-10-07 NOTE — Progress Notes (Signed)
    CLINIC ENCOUNTER NOTE  History:  34 y.o. G1P0101 here today for five reasons and she is very frustrated: 1) Irregular bleeding/PCOS: Continues to have irregular menses/spotting despite being on Metformin. Declines hormonal therapy.  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 Wants Clomid as she has used this in the past 3) Chronic pelvic pain: She has been told by several providers this is due to her PCOS but she wants to know why she keeps getting sharp pains in her pelvic region not related to bleeding, urinary or GI symptoms. Sporadic in nature. 4) Vaginal discharge: Notices white , nonpruritic vaginal discharge once in a while and is concerned about this 5) Bleeding after intercourse: Notices this occasionally.  Patient reports that her primary concern is #2.  The following portions of the patient's history were reviewed and updated as appropriate: allergies, current medications, past family history, past medical history, past social history, past surgical history and problem list. Normal pap and negative HRHPV on 04/22/14.    Review of Systems:  Pertinent items are noted in HPI.  Objective:  Physical Exam BP 123/86 mmHg  Pulse 74  Temp(Src) 98.3 F (36.8 C) (Oral)  Ht 5\' 7"  (1.702 m)  Wt 164 lb 6.4 oz (74.571 kg)  BMI 25.74 kg/m2  LMP 07/15/2014 (Approximate) Gen: NAD Abd: Soft, nontender and nondistended Pelvic: Normal appearing external genitalia; normal appearing vaginal mucosa and cervix.  Large cervical ectropion noted. Scant clear discharge seen, pelvic cultures obtained.  Small uterus, no other palpable masses, mild uterine or adnexal tenderness  Labs and Imaging 07/07/13 Pelvic Ultrasound  Unremarkable exam except numerous follicles in the periphery of both ovaries, could be consistent with polycystic ovarian syndrome  Assessment & Plan:  1) Irregular bleeding/PCOS: Counseled that hormonal therapy can help with irregular spotting; she declines  hormonal therapy for now.  Metformin refilled.  2) PCOS related infertility: Recommended REI referral, she declined for now. Clomid prescribed with Provera to induce bleeding. Instructions given to patient on how to take Clomid and optimize chances of conception.  3) Chronic pelvic pain:  Unclear etiology of her pain, not corresponding to any system.  Will follow up pelvic cultures and obtain pelvic ultrasound.  Will follow up results and manage accordingly. 4) Vaginal discharge: Cultures pending, will follow up results and manage accordingly. 5) Bleeding after intercourse: Ectropion noted on exam. Patient informed that this is a benign finding, she was reassured. Will proceed with cryotherapy if this gets very concerning. Follow up in six weeks.   Jaynie CollinsUGONNA  ANYANWU, MD, FACOG Attending Obstetrician & Gynecologist Center for [facility], [facility]

## 2014-10-07 NOTE — Patient Instructions (Addendum)
Return to clinic for any scheduled appointments or for any gynecologic concerns as needed.   Clomid challenge/therapy Given that patient has anovulatory cycles, discussed Clomid challenge/therapy.  She was given Rx for Provera 10mg  po qd x 10 days.   She was told to watch for withdrawal bleeding after the course, the first day of bleeding will be Day 1 for her next cycle.  She was also given a Rx for Clomid 50mg  po qd to take on days 5-9 of her cycle.  The risks of Clomid including ovarian hyperstimulation with possible risk of ovarian cancer as well as multiple gestation were discussed with patient.  Patient also advised to continue frequent intercourse especially around Day 14 of her upcoming cycle (qod intercourse around days 9 - 18).  By Day 35, patient was told to call if she had her period.  If patient has bleeding at end of cycle, will increase Clomid by 50mg  for the following cycle; she can have a total of 6 cycles.  However if patient does not have bleeding, she was told to do a pregnancy test/come in for evaluation.   CLOMID PATIENT INSTRUCTIONS  WHY USE IT? Clomid helps your ovaries to release eggs (ovulate).  HOW TO USE IT? Clomid is taken as a pill usually on days 5,6,7,8, & 9 of your cycle.  Day 1 is the first day of your period. The dose or duration may be changed to achieve ovulation.  Provera (progesterone) may first be used to bring on a period for some patients. Take Provera 10 mg daily for 10 days to bring on a period; the first day you get bleeding is Day 1 of your cycle.   If you do not get pregnant this cycle, for your next cycles, take on days 1, 2, 3, 4 and 5.  If you do not get a period, take Provera 10 mg daily for 10 days to bring on a period; the first day you get bleeding is Day 1 of your cycle. The day of ovulation on Clomid is usually between cycle day 14 and 17.  Having sexual intercourse at least every other day between cycle day 13 and 18 will improve your chances of  becoming pregnant during the Clomid cycle.  You may monitor your ovulation using basal body temperature charts or with ovulation kits.  If using the ovulation predictor kits, having intercourse the day of the surge and the two days following is recommended. If you get your period, call when it starts for an appointment with your doctor, so that an exam may be done, and another Clomid cycle can be considered if appropriate. If you do not get a period by day 35 of the cycle, please get a blood pregnancy test.  If it is negative, speak to your doctor for instructions to bring on another period and to plan a follow-up appointment.  THINGS TO KNOW: If you get pregnant while using Clomid, your chance of twins is 7% and triplets is less than 1%. Some studies have suggested the use of "fertility drugs" may increase your risk of ovarian cancers in the future.  It is unclear if these drugs increase the risk, or people who have problems with fertility are prone for these cancers.  If there is an actual risk, it is very low.  If you have a history of liver problems or ovarian cancer, it may be wise to avoid this medication.  SIDE EFFECTS:  The most common side effect is hot flashes (  20%).  Breast tenderness, headaches, nausea, bloating may also occur at different times.  Less than 3/1,000 people have dryness or loss of hair.  Persistent ovarian cysts may form from the use of this medication.  Ovarian hyperstimulation syndrome is a rare side effect at low doses.  Visual changes like flashes of light or blurring.

## 2014-10-08 LAB — WET PREP, GENITAL
TRICH WET PREP: NONE SEEN
YEAST WET PREP: NONE SEEN

## 2014-10-08 LAB — GC/CHLAMYDIA PROBE AMP
CT PROBE, AMP APTIMA: NEGATIVE
GC Probe RNA: NEGATIVE

## 2014-10-12 ENCOUNTER — Ambulatory Visit: Payer: Medicaid Other | Admitting: Obstetrics & Gynecology

## 2014-10-12 ENCOUNTER — Ambulatory Visit (HOSPITAL_COMMUNITY)
Admission: RE | Admit: 2014-10-12 | Discharge: 2014-10-12 | Disposition: A | Discharge status: Home / Self Care | Payer: Medicaid Other | Source: Ambulatory Visit | Attending: Obstetrics & Gynecology | Admitting: Obstetrics & Gynecology

## 2014-10-12 DIAGNOSIS — G8929 Other chronic pain: Secondary | ICD-10-CM | POA: Diagnosis not present

## 2014-10-12 DIAGNOSIS — R102 Pelvic and perineal pain: Secondary | ICD-10-CM

## 2014-10-12 DIAGNOSIS — N912 Amenorrhea, unspecified: Secondary | ICD-10-CM | POA: Insufficient documentation

## 2014-10-14 ENCOUNTER — Telehealth: Payer: Self-pay | Admitting: *Deleted

## 2014-10-14 NOTE — Telephone Encounter (Signed)
-----   Message from Tereso NewcomerUgonna A Anyanwu, MD sent at 10/13/2014 11:37 AM EST ----- Small uterus. Ovaries are small with numerous follicles consistent with PCOS, no significant cysts seen. No sonographic etiology of her pain.  Please call to inform patient of results.

## 2014-10-14 NOTE — Telephone Encounter (Signed)
Called Jasmine Castaneda and gave her the results of her ultrasound as directed by Dr. Macon LargeAnyanwu. She asked if it shows her ovaries are healthy, does she have eggs because if not she doesn't thinks she needs to take the clomid, etc.  She states she is wanting to get pregnancy. We discussed the ultrasound confirms she has PCOS and no other abnormal results. So yes, her ovaries are " healthy besides having PCOS, but I can't answer about if she has eggs, and pregnancy, etc. She has a follow up appointment before that but she wants to know before then. I informed her I would send a message to Dr. Macon LargeAnyanwu and then call her back once I have more answers.

## 2014-10-15 ENCOUNTER — Telehealth: Payer: Self-pay

## 2014-10-15 NOTE — Telephone Encounter (Signed)
Pt wants results of pelvic US. Called pt and informed her that her US results confirmed what she knew which is that she has PCOS.  Pt asked "well what about my eggs".  I informed pt that if she is wanting to know about ovulation, I stated that there is another test that can be done to see if there is any blockage called a HSG.  I informed her that insurance usually does not cover the test and it could range from $700-$900.  I also informed pt that we no longer do infertility here and that the best thing for her would be to refer to a infertility specialist.  I gave pt information to Dr. April MansonYalcinkaya and advised that she call to speak with them.  I also let her know that Medicaid does not cover Infertility specialist so she will have to pay out of pocket but they do have a finance program.  Pt took down the information and stated that she may give them a call.

## 2014-10-15 NOTE — Telephone Encounter (Signed)
Only an infertility specialist can tell definitively if she has eggs after doing specialized procedures. Eggs are microscopic, can be seen on ultrasound.  Ovaries appear normal. She should take Clomid as instructed or she can be referred to infertility specialist for further evaluation.

## 2014-10-22 IMAGING — US US TRANSVAGINAL NON-OB
1 series · 13 of 25 positions shown · non-contrast
Comparison: 11/17/2011

CLINICAL DATA: Pelvic pain for 1 month, right lower quadrant pain,
history polycystic ovary syndrome, LEEP

EXAM:
TRANSABDOMINAL AND TRANSVAGINAL ULTRASOUND OF PELVIS
TECHNIQUE: Both transabdominal and transvaginal ultrasound examinations of the
pelvis were performed. Transabdominal technique was performed for
global imaging of the pelvis including uterus, ovaries, adnexal
regions, and pelvic cul-de-sac. It was necessary to proceed with
endovaginal exam following the transabdominal exam to visualize the
endometrium and ovaries.

[Series 1: us pelvis complete · 79 acquisitions, 13 frames shown]
[im 1/79]
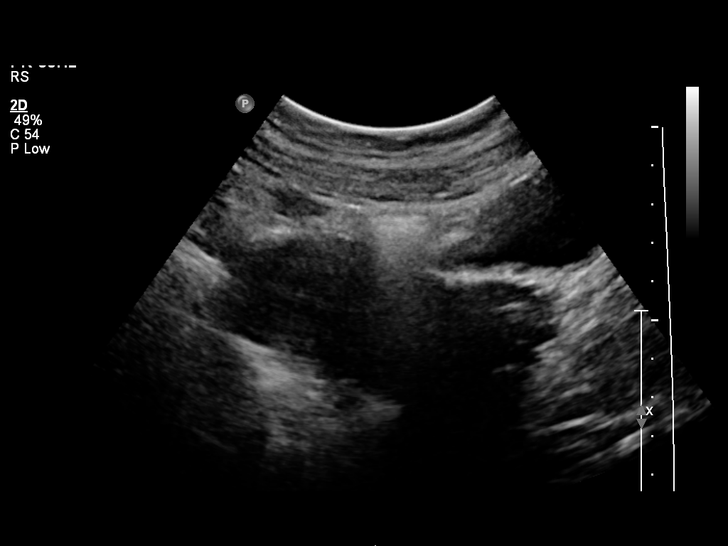
[im 7/79]
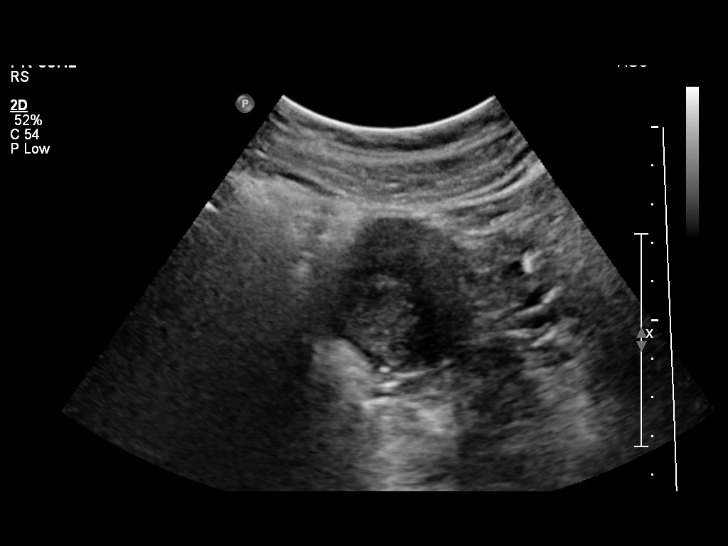
[im 14/79]
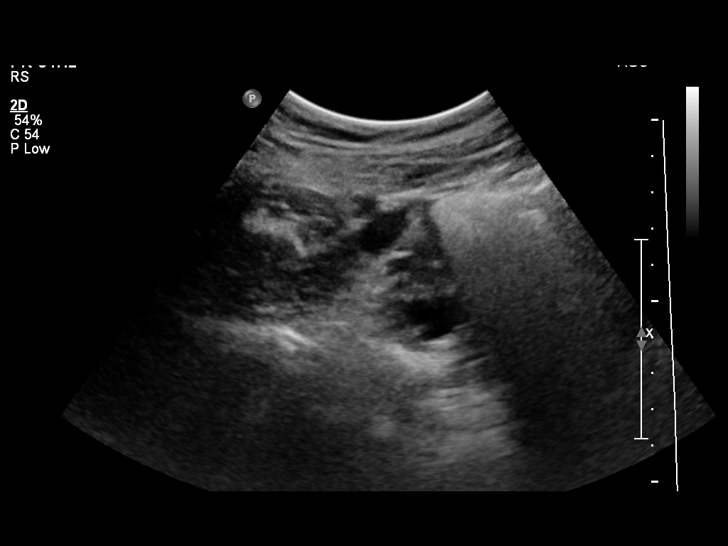
[im 20/79]
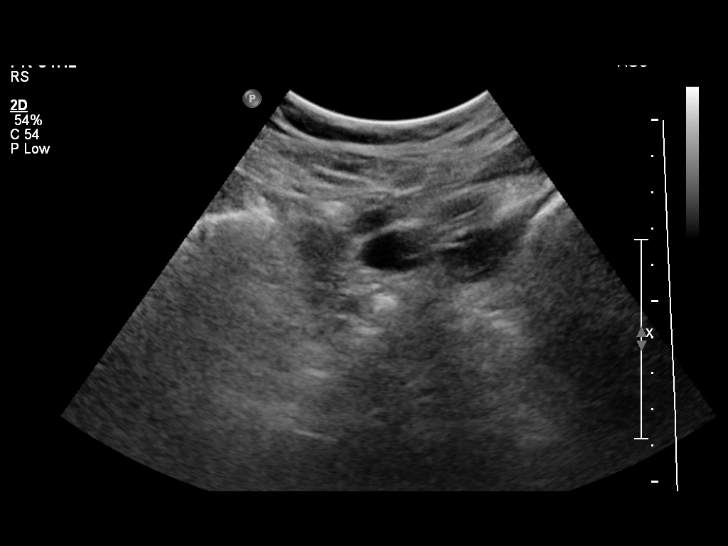
[im 27/79]
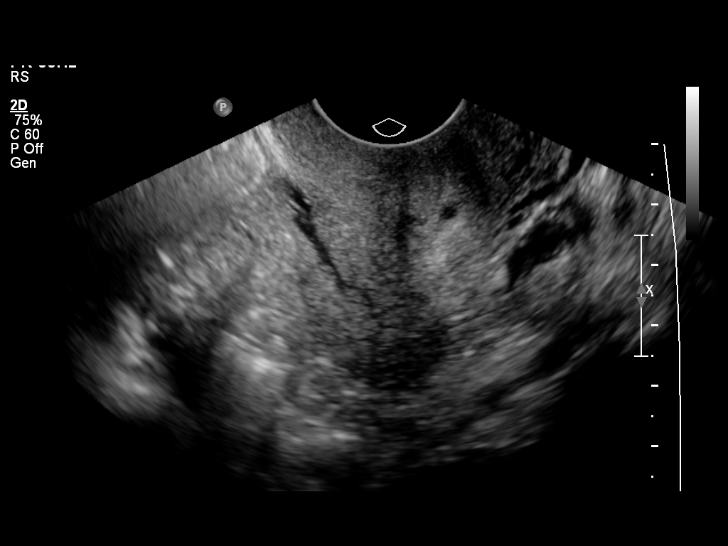
[im 33/79]
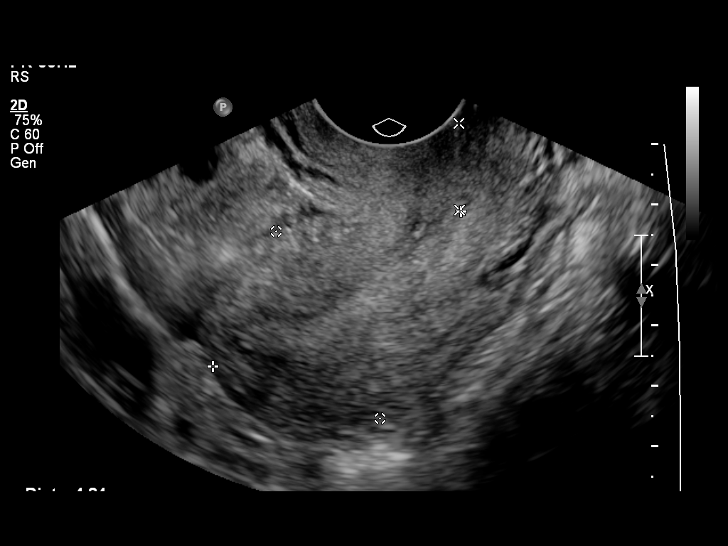
[im 40/79]
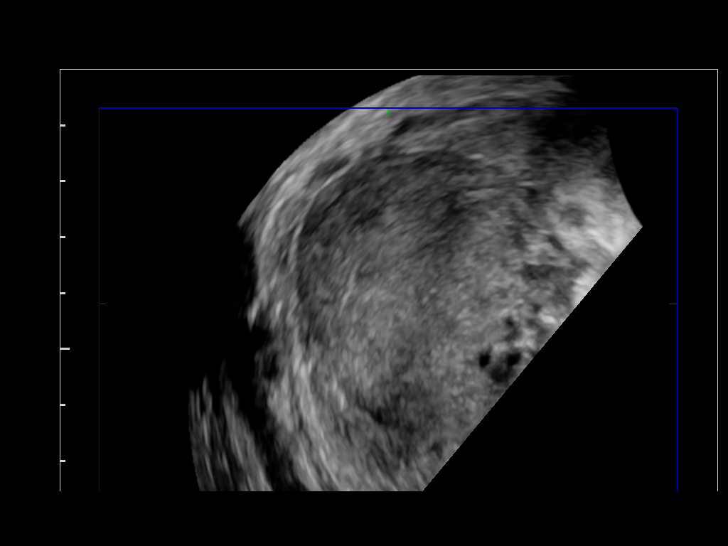
[im 46/79]
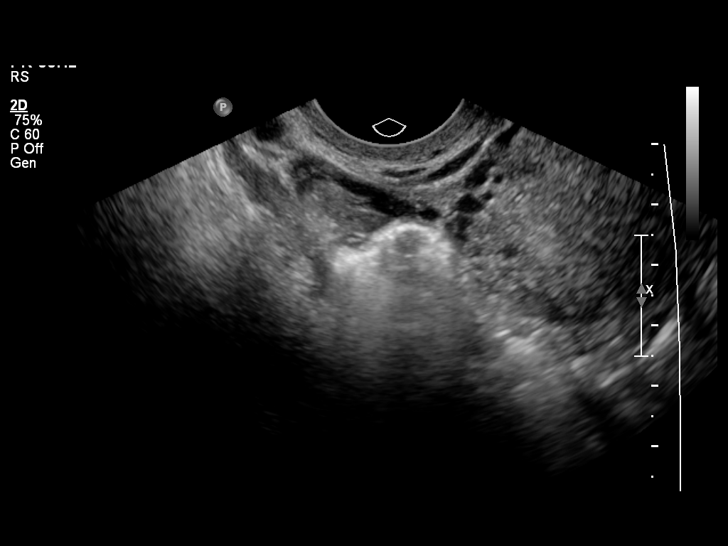
[im 53/79]
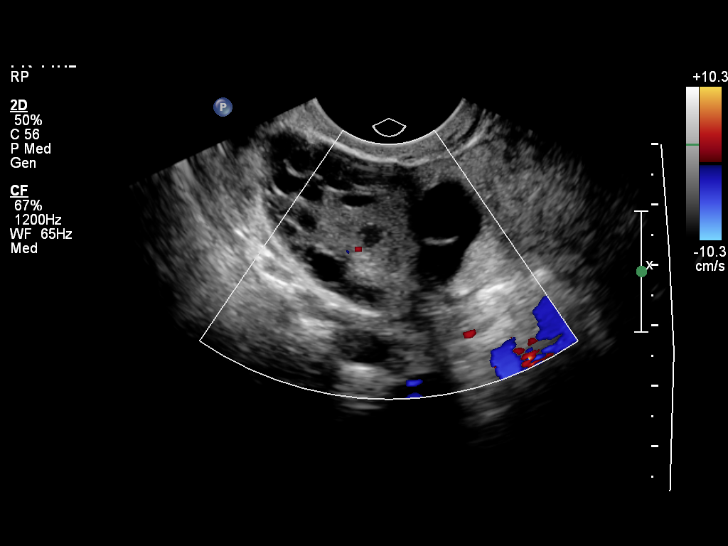
[im 59/79]
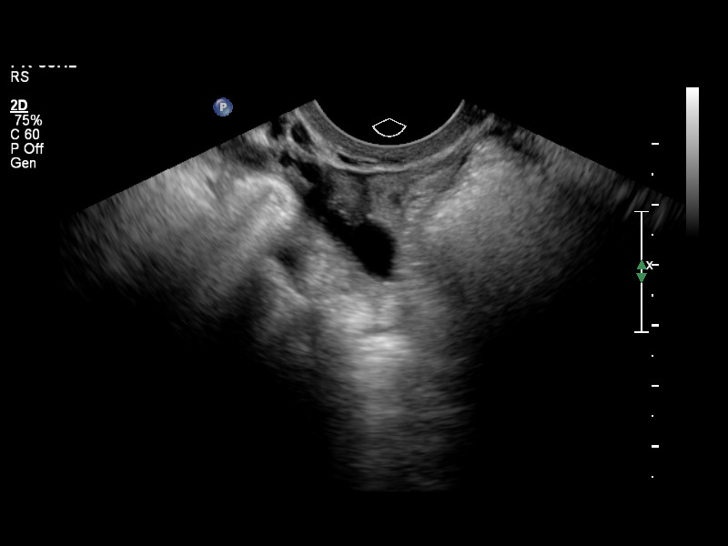
[im 66/79]
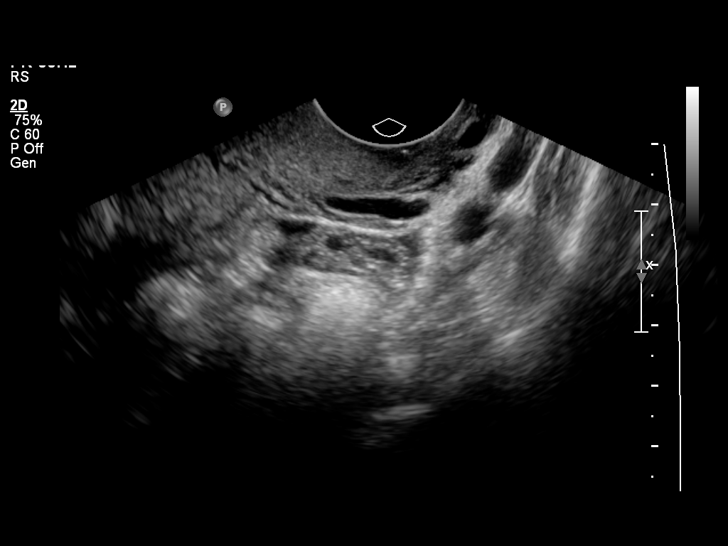
[im 72/79]
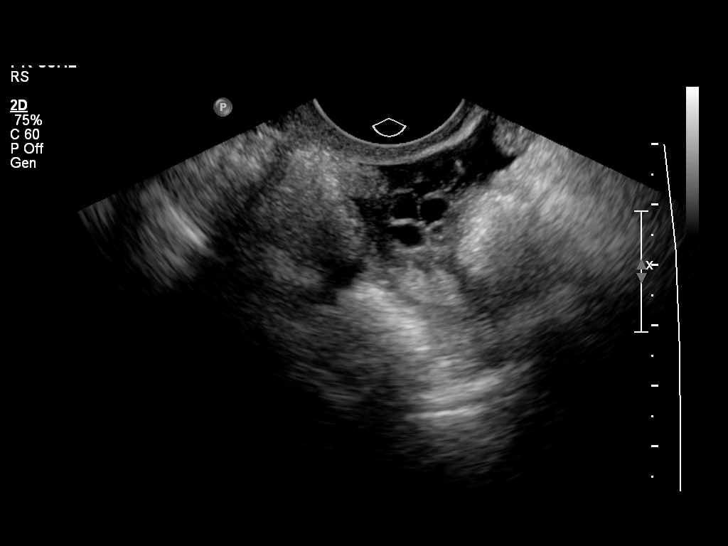
[im 79/79]
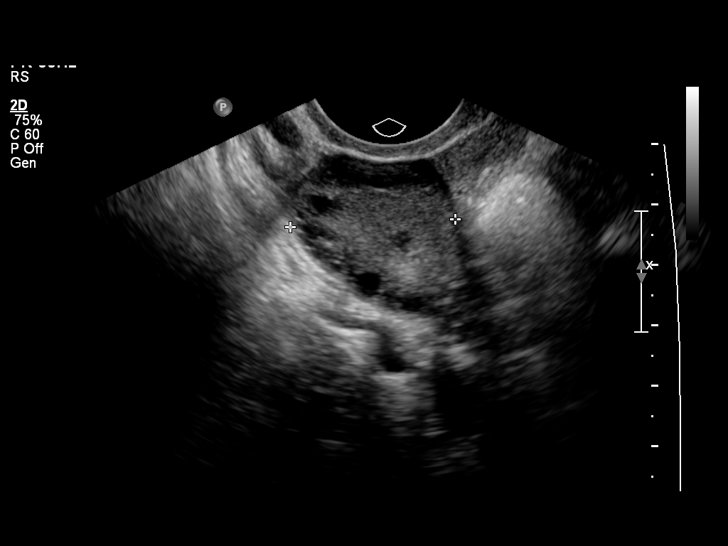

[13 of 25 positions shown; findings below may reference images not displayed]

FINDINGS: Uterus

Measurements: 6.3 x 3.5 x 4.3 cm. Normal morphology without focal
uterine mass.

Endometrium

Thickness: 5.4 mm thick, normal. No endometrial fluid.

Right ovary

Measurements: 3.8 x 2.6 x 2.7 cm. Numerous peripheral follicles
ringing the right ovary. No dominant mass.

Left ovary

Measurements: 3.4 x 2.2 x 2.9 cm. Numerous peripheral follicles
ringing the left ovary. No dominant mass.

Other findings

Small amount of nonspecific free pelvic fluid.  No adnexal masses.
IMPRESSION: Numerous follicles in the periphery of both ovaries ovarian sizes
similar to previous exam, could be consistent with polycystic
ovarian syndrome in the appropriate clinical setting.

Remainder of exam unremarkable.

## 2014-10-27 ENCOUNTER — Telehealth: Payer: Self-pay | Admitting: General Practice

## 2014-10-27 ENCOUNTER — Telehealth: Payer: Self-pay | Admitting: *Deleted

## 2014-10-27 NOTE — Telephone Encounter (Signed)
Pt left message stating that she was prescribed Provera for 10 days and has finished the medication but has not has a withdrawal bleed. Her last dose of Provera was on 2/5. She is waiting to start Clomid on Sally Reimers # 5 of bleeding. She did have one episode of brown discharge and wants to know if this is considered a bleed and to be Kaylan Yates #1.  I called pt today and explained that we have not been in the office since she left her original message on 2/12. She stated that she had started bleeding on 2/13 and it was bright red with small clots. She is using approximately 4 pads per Maxten Shuler. She asked if this is normal. I told pt that this is all normal and she should consider her Izan Miron #1 of bleeding as 2/13, not the Kaiea Esselman of brown discharge. She may take Clomid as prescribed beginning on Valla Pacey #5. She should come to [facility] if her bleeding increases to one or more saturated pads per hour for 3 consecutive hours. Pt should keep scheduled clinic appt on 2/24 @ 1415.  Pt voiced understanding of all instructions given.

## 2014-10-27 NOTE — Telephone Encounter (Signed)
Patient called and left message stating she called on Friday and hasn't heard anything back from anyone and she is on her period currently and it is extremely heavy and she is bleeding through a super tampon and maxi pad very quickly. Please call back and advise. Called patient and she states she already spoke with someone who addressed her concerns. Patient had no other questions

## 2014-11-04 ENCOUNTER — Ambulatory Visit (INDEPENDENT_AMBULATORY_CARE_PROVIDER_SITE_OTHER): Payer: Medicaid Other | Admitting: Obstetrics & Gynecology

## 2014-11-04 VITALS — BP 117/75 | HR 69 | Temp 98.8°F | Ht 68.0 in | Wt 168.3 lb

## 2014-11-04 DIAGNOSIS — E282 Polycystic ovarian syndrome: Secondary | ICD-10-CM

## 2014-11-04 NOTE — Progress Notes (Signed)
   CLINIC ENCOUNTER NOTE  History:  34 y.o. G1P0101 here today for follow up after last visit. Please refer to 10/07/14 notes for more details.  Took Provera, then Clomid as instructed.  On Day 12 on her cycle.  No other complaints.  Normal ultrasound results already discussed with patient.  The following portions of the patient's history were reviewed and updated as appropriate: allergies, current medications, past family history, past medical history, past social history, past surgical history and problem list. Normal pap and negative HRHPV on 04/22/14.   Review of Systems:  A comprehensive review of systems was negative.  Objective:  BP 117/75 mmHg  Pulse 69  Temp(Src) 98.8 F (37.1 C)  Ht 5\' 8"  (1.727 m)  Wt 168 lb 4.8 oz (76.34 kg)  BMI 25.60 kg/m2  LMP 10/24/2014 Physical Exam deferred  Labs and Imaging 10/12/2014   CLINICAL DATA:  Amenorrhea.  Chronic pain  EXAM: TRANSABDOMINAL AND TRANSVAGINAL ULTRASOUND OF PELVIS  TECHNIQUE: Both transabdominal and transvaginal ultrasound examinations of the pelvis were performed. Transabdominal technique was performed for global imaging of the pelvis including uterus, ovaries, adnexal regions, and pelvic cul-de-sac. It was necessary to proceed with endovaginal exam following the transabdominal exam to visualize the ovaries.  COMPARISON:  07/07/2013  FINDINGS: Uterus  Measurements: 5.9 x 4.0 x 4.3 cm. No fibroids or other mass visualized.  Endometrium  Thickness: 9.4 mm.  No focal abnormality visualized.  Right ovary  Measurements: 3.9 x 2.6 x 2.8 cm. The volume is equal to 14 cc. Numerous peripheral follicles measuring less than 1 cm are identified. No adnexal mass.  Left ovary  Measurements: 4 x 2.6 x 3.4 cm. The volume is equal to 17.7 cc. Numerous peripheral follicles are identified. No suspicious adnexal mass.  Other findings  Small amount of free fluid in the left adnexa noted.  IMPRESSION: 1. Findings meet sonographic criteria for PCOS ; per 2003  consensus conference statement by the European Society for Human Reproduction and Embryology and the [employer]roductive Medicine.   Electronically Signed   By: Signa Kellaylor  Stroud M.D.   On: 10/12/2014 15:02    Assessment & Plan:  Counseled on use of home ovulation kits and frequent intercourse around this time of her cycle Follow up in 2 months Routine preventative health maintenance measures emphasized.   Jaynie CollinsUGONNA  Isatou Agredano, MD, FACOG Attending Obstetrician & Gynecologist Center for [facility], [facility]

## 2014-11-04 NOTE — Patient Instructions (Signed)
Return to clinic for any scheduled appointments or for any gynecologic concerns as needed.   

## 2015-01-06 ENCOUNTER — Ambulatory Visit (INDEPENDENT_AMBULATORY_CARE_PROVIDER_SITE_OTHER): Payer: Medicaid Other | Admitting: Obstetrics & Gynecology

## 2015-01-06 ENCOUNTER — Encounter: Payer: Self-pay | Admitting: Obstetrics & Gynecology

## 2015-01-06 VITALS — BP 113/73 | HR 83 | Temp 98.3°F | Wt 168.8 lb

## 2015-01-06 DIAGNOSIS — N898 Other specified noninflammatory disorders of vagina: Secondary | ICD-10-CM

## 2015-01-06 DIAGNOSIS — R102 Pelvic and perineal pain: Secondary | ICD-10-CM

## 2015-01-06 DIAGNOSIS — E282 Polycystic ovarian syndrome: Secondary | ICD-10-CM | POA: Diagnosis not present

## 2015-01-06 DIAGNOSIS — N949 Unspecified condition associated with female genital organs and menstrual cycle: Secondary | ICD-10-CM

## 2015-01-06 DIAGNOSIS — G8929 Other chronic pain: Secondary | ICD-10-CM | POA: Diagnosis not present

## 2015-01-06 MED ORDER — MEDROXYPROGESTERONE ACETATE 10 MG PO TABS: 10.0000 mg | ORAL_TABLET | Freq: Every day | ORAL | Status: DC

## 2015-01-06 MED ORDER — FLUCONAZOLE 150 MG PO TABS: 150.0000 mg | ORAL_TABLET | Freq: Once | ORAL | Status: DC

## 2015-01-06 MED ORDER — CLOMIPHENE CITRATE 50 MG PO TABS: 100.0000 mg | ORAL_TABLET | Freq: Every day | ORAL | Status: DC

## 2015-01-06 MED ORDER — METFORMIN HCL 850 MG PO TABS: 850.0000 mg | ORAL_TABLET | Freq: Two times a day (BID) | ORAL | Status: DC

## 2015-01-06 NOTE — Progress Notes (Signed)
    CLINIC ENCOUNTER NOTE  History:  34 y.o. G1P0101 here today for follow up for PCOS related secondary infertility.  Has taken Clomid 50 mg for three months with Metformin 850 mg bid without any success.  Ovulation predictor kits do not show that she ovulated.  No abnormal bleeding, vaginal discharge, abdominal pain or other complaints  Past Medical History  Diagnosis Date  . Abnormal Pap smear     leep  . Infertility   . PCOS (polycystic ovarian syndrome)     Past Surgical History  Procedure Laterality Date  . Leep      The following portions of the patient's history were reviewed and updated as appropriate: allergies, current medications, past family history, past medical history, past social history, past surgical history and problem list.   Health Maintenance:  Normal pap and negative HRHPV on 04/22/14   Review of Systems:  A comprehensive review of systems was negative.  Objective:  Physical Exam BP 113/73 mmHg  Pulse 83  Temp(Src) 98.3 F (36.8 C)  Wt 168 lb 12.8 oz (76.567 kg)  LMP 11/24/2014 CONSTITUTIONAL: Well-developed, well-nourished female in no acute distress.  HENT:  Normocephalic, atraumatic, External right and left ear normal. Oropharynx is clear and moist EYES: Conjunctivae and EOM are normal. Pupils are equal, round, and reactive to light. No scleral icterus.  NECK: Normal range of motion, supple, no masses SKIN: Skin is warm and dry. No rash noted. Not diaphoretic. No erythema. No pallor. NEUROLGIC: Alert and oriented to person, place, and time. No cranial nerve deficit noted. PSYCHIATRIC: Normal mood and affect. Normal behavior. Normal judgment and thought content. CARDIOVASCULAR: Normal heart rate noted, regular rhythm RESPIRATORY: Effort and breath sounds normal, no problems with respiration noted ABDOMEN: Soft, no distention noted.   PELVIC: Deferred MUSCULOSKELETAL: Normal range of motion. No edema and no tenderness.  Labs and  Imaging 10/12/2014 CLINICAL DATA: Amenorrhea. Chronic pain EXAM: TRANSABDOMINAL AND TRANSVAGINAL ULTRASOUND OF PELVIS TECHNIQUE: Both transabdominal and transvaginal ultrasound examinations of the pelvis were performed. Transabdominal technique was performed for global imaging of the pelvis including uterus, ovaries, adnexal regions, and pelvic cul-de-sac. It was necessary to proceed with endovaginal exam following the transabdominal exam to visualize the ovaries. COMPARISON: 07/07/2013 FINDINGS: Uterus Measurements: 5.9 x 4.0 x 4.3 cm. No fibroids or other mass visualized. Endometrium Thickness: 9.4 mm. No focal abnormality visualized. Right ovary Measurements: 3.9 x 2.6 x 2.8 cm. The volume is equal to 14 cc. Numerous peripheral follicles measuring less than 1 cm are identified. No adnexal mass. Left ovary Measurements: 4 x 2.6 x 3.4 cm. The volume is equal to 17.7 cc. Numerous peripheral follicles are identified. No suspicious adnexal mass. Other findings Small amount of free fluid in the left adnexa noted. IMPRESSION: 1. Findings meet sonographic criteria for PCOS ; per 2003 consensus conference statement by the European Society for Human Reproduction and Embryology and the [employer]. Electronically Signed By: Signa Kellaylor Stroud M.D. On: 10/12/2014 15:02   Assessment & Plan:  Clomid increased to 100 mg dosage; refilled Metformin and Provera.  Will remain on this dose for three months, if no success, patient understands she will be [facility] to [facility] for [facility] and management. Continue the use of home ovulation kits and frequent intercourse around ovulation Follow up in 3 months Routine preventative health maintenance measures emphasized.   Jaynie CollinsUGONNA  Aoki Wedemeyer, MD, FACOG Attending Obstetrician & Gynecologist Center for [facility], [facility]

## 2015-01-06 NOTE — Patient Instructions (Signed)
Return to clinic for any scheduled appointments or for any gynecologic concerns as needed.   

## 2015-05-07 ENCOUNTER — Ambulatory Visit (INDEPENDENT_AMBULATORY_CARE_PROVIDER_SITE_OTHER): Payer: Medicaid Other | Admitting: Obstetrics & Gynecology

## 2015-05-07 ENCOUNTER — Encounter: Payer: Self-pay | Admitting: Obstetrics & Gynecology

## 2015-05-07 VITALS — BP 127/87 | HR 81 | Temp 98.3°F | Wt 159.6 lb

## 2015-05-07 DIAGNOSIS — B373 Candidiasis of vulva and vagina: Secondary | ICD-10-CM

## 2015-05-07 DIAGNOSIS — B3731 Acute candidiasis of vulva and vagina: Secondary | ICD-10-CM

## 2015-05-07 MED ORDER — FLUCONAZOLE 150 MG PO TABS: 150.0000 mg | ORAL_TABLET | Freq: Once | ORAL | Status: DC

## 2015-05-07 NOTE — Progress Notes (Signed)
History:  34 y.o. G1P0101 here today for eval of 'yeast' infxn.  She reports that she took one diflucan at home yesterday with no reilief  The following portions of the patient's history were reviewed and updated as appropriate: allergies, current medications, past family history, past medical history, past social history, past surgical history and problem list.  Review of Systems:  Pertinent items are noted in HPI.  Objective:  Physical Exam Blood pressure 127/87, pulse 81, temperature 98.3 F (36.8 C), weight 159 lb 9.6 oz (72.394 kg). Gen: NAD Abd: Soft, nontender and nondistended Pelvic: Normal appearing external genitalia; normal appearing vaginal mucosa and cervix.  Normal discharge.  Small uterus, no other palpable masses, no uterine or adnexal tenderness   Assessment & Plan:  Yeast vaginitis  Diflucan  x 2 more days

## 2015-05-07 NOTE — Patient Instructions (Signed)

## 2015-05-08 LAB — WET PREP, GENITAL
TRICH WET PREP: NONE SEEN
Yeast Wet Prep HPF POC: NONE SEEN

## 2015-05-11 ENCOUNTER — Other Ambulatory Visit: Payer: Self-pay | Admitting: Obstetrics & Gynecology

## 2015-05-11 DIAGNOSIS — B9689 Other specified bacterial agents as the cause of diseases classified elsewhere: Secondary | ICD-10-CM

## 2015-05-11 DIAGNOSIS — N760 Acute vaginitis: Principal | ICD-10-CM

## 2015-05-11 MED ORDER — CLINDAMYCIN HCL 300 MG PO CAPS: 300.0000 mg | ORAL_CAPSULE | Freq: Two times a day (BID) | ORAL | Status: DC

## 2015-05-11 NOTE — Progress Notes (Signed)
Called patient and informed her of BV and Rx sent to pharmacy. Patient verbalized understanding and had no questions

## 2015-08-24 ENCOUNTER — Telehealth: Payer: Self-pay | Admitting: *Deleted

## 2015-08-24 DIAGNOSIS — N760 Acute vaginitis: Principal | ICD-10-CM

## 2015-08-24 DIAGNOSIS — B9689 Other specified bacterial agents as the cause of diseases classified elsewhere: Secondary | ICD-10-CM

## 2015-08-24 MED ORDER — CLINDAMYCIN HCL 300 MG PO CAPS: 300.0000 mg | ORAL_CAPSULE | Freq: Two times a day (BID) | ORAL | Status: DC

## 2015-08-24 NOTE — Telephone Encounter (Addendum)
Pt left message requesting refill of Clindamycin. Per chart review, pt was treated for BV @ end of August with Clindamycin.  Rx approved by Dr. Shawnie PonsPratt. Pt was called and informed that Rx will be available today. She was also advised that if the symptoms return within 6 months, she may need to have evaluation in office prior to treatment. She voiced understanding and will obtain Rx today.

## 2015-09-27 ENCOUNTER — Ambulatory Visit: Payer: Self-pay

## 2015-10-06 ENCOUNTER — Encounter (HOSPITAL_BASED_OUTPATIENT_CLINIC_OR_DEPARTMENT_OTHER): Payer: Self-pay

## 2015-10-06 ENCOUNTER — Emergency Department (HOSPITAL_BASED_OUTPATIENT_CLINIC_OR_DEPARTMENT_OTHER)
Admission: EM | Admit: 2015-10-06 | Discharge: 2015-10-06 | Disposition: A | Discharge status: Home / Self Care | Payer: Medicaid Other | Attending: Emergency Medicine | Admitting: Emergency Medicine

## 2015-10-06 DIAGNOSIS — H748X1 Other specified disorders of right middle ear and mastoid: Secondary | ICD-10-CM | POA: Diagnosis not present

## 2015-10-06 DIAGNOSIS — Z792 Long term (current) use of antibiotics: Secondary | ICD-10-CM | POA: Diagnosis not present

## 2015-10-06 DIAGNOSIS — R51 Headache: Secondary | ICD-10-CM | POA: Insufficient documentation

## 2015-10-06 DIAGNOSIS — J069 Acute upper respiratory infection, unspecified: Secondary | ICD-10-CM | POA: Diagnosis not present

## 2015-10-06 DIAGNOSIS — H9209 Otalgia, unspecified ear: Secondary | ICD-10-CM | POA: Insufficient documentation

## 2015-10-06 DIAGNOSIS — Z79899 Other long term (current) drug therapy: Secondary | ICD-10-CM | POA: Insufficient documentation

## 2015-10-06 DIAGNOSIS — M791 Myalgia: Secondary | ICD-10-CM | POA: Diagnosis not present

## 2015-10-06 DIAGNOSIS — Z7984 Long term (current) use of oral hypoglycemic drugs: Secondary | ICD-10-CM | POA: Diagnosis not present

## 2015-10-06 DIAGNOSIS — Z87448 Personal history of other diseases of urinary system: Secondary | ICD-10-CM | POA: Diagnosis not present

## 2015-10-06 DIAGNOSIS — R05 Cough: Secondary | ICD-10-CM | POA: Diagnosis present

## 2015-10-06 DIAGNOSIS — Z8639 Personal history of other endocrine, nutritional and metabolic disease: Secondary | ICD-10-CM | POA: Diagnosis not present

## 2015-10-06 MED ORDER — OXYMETAZOLINE HCL 0.05 % NA SOLN
1.0000 | Freq: Once | NASAL | Status: AC
  Administered 2015-10-06: 1 via NASAL
  Filled 2015-10-06: qty 15

## 2015-10-06 NOTE — ED Provider Notes (Signed)
CSN: [id number]     Arrival date & time 10/06/15  1220 History   First MD Initiated Contact with Patient 10/06/15 1229     Chief Complaint  Patient presents with  . URI     (Consider location/radiation/quality/duration/timing/severity/associated sxs/prior Treatment) Patient is a 35 y.o. female presenting with URI. The history is provided by the patient.  URI Presenting symptoms: congestion, cough, ear pain, facial pain and rhinorrhea   Severity:  Severe Onset quality:  Gradual Duration:  2 days Timing:  Constant Progression:  Worsening Chronicity:  New Relieved by:  Nothing Worsened by:  Nothing tried Ineffective treatments: bc powder. Associated symptoms: headaches, myalgias, sinus pain, sneezing and swollen glands   Risk factors: sick contacts   Risk factors: no chronic cardiac disease, no chronic kidney disease and no chronic respiratory disease     Past Medical History  Diagnosis Date  . Abnormal Pap smear     leep  . Infertility   . PCOS (polycystic ovarian syndrome)    Past Surgical History  Procedure Laterality Date  . Leep     Family History  Problem Relation Age of Onset  . Hypertension Father   . Cancer Mother    Social History  Substance Use Topics  . Smoking status: Never Smoker   . Smokeless tobacco: Never Used  . Alcohol Use: No   OB History    Gravida Para Term Preterm AB TAB SAB Ectopic Multiple Living   0 0 0 0 0 1     Review of Systems  HENT: Positive for congestion, ear pain, rhinorrhea and sneezing.   Respiratory: Positive for cough.   Musculoskeletal: Positive for myalgias.  Neurological: Positive for headaches.  All other systems reviewed and are negative.     Allergies  Septra and Flagyl  Home Medications   Prior to Admission medications   Medication Sig Start Date End Date Taking? Authorizing Provider  cetirizine (ZYRTEC) 10 MG tablet Take 10 mg by mouth daily.    Historical Provider, MD  clindamycin (CLEOCIN) 300  MG capsule Take 1 capsule (300 mg total) by mouth 2 (two) times daily. 08/24/15   Reva Bores, MD  clomiPHENE (CLOMID) 50 MG tablet Take 2 tablets (100 mg total) by mouth daily. Take on days 5-9 of your period 01/06/15   Tereso Newcomer, MD  fluconazole (DIFLUCAN) 150 MG tablet Take 1 tablet (150 mg total) by mouth once. Can take additional dose three days later if symptoms persist Patient not taking: Reported on 05/07/2015 01/06/15   Tereso Newcomer, MD  fluconazole (DIFLUCAN) 150 MG tablet Take 1 tablet (150 mg total) by mouth once. 05/07/15   Willodean Rosenthal, MD  medroxyPROGESTERone (PROVERA) 10 MG tablet Take 1 tablet (10 mg total) by mouth daily. Use for ten days Patient not taking: Reported on 05/07/2015 01/06/15   Tereso Newcomer, MD  metFORMIN (GLUCOPHAGE) 850 MG tablet Take 1 tablet (850 mg total) by mouth 2 (two) times daily with a meal. 01/06/15   Vela Prose A Anyanwu, MD   BP 132/99 mmHg  Pulse 76  Temp(Src) 98.2 F (36.8 C) (Oral)  Resp 18  Ht  (1.727 m)  Wt 164 lb (74.39 kg)  BMI 24.94 kg/m2  SpO2 100%  LMP  (LMP Unknown) Physical Exam  Constitutional: She is oriented to person, place, and time. She appears well-developed and well-nourished. No distress.  HENT:  Head: Normocephalic and atraumatic.  Right Ear: A middle ear effusion is  present.  Left Ear: Tympanic membrane normal.  Nose: Mucosal edema and rhinorrhea present. Right sinus exhibits maxillary sinus tenderness and frontal sinus tenderness. Left sinus exhibits maxillary sinus tenderness and frontal sinus tenderness.  Mouth/Throat: Posterior oropharyngeal erythema present.  Eyes: Conjunctivae and EOM are normal. Pupils are equal, round, and reactive to light.  Neck: Normal range of motion. Neck supple.  Cardiovascular: Normal rate, regular rhythm and intact distal pulses.   No murmur heard. Pulmonary/Chest: Effort normal and breath sounds normal. No respiratory distress. She has no wheezes. She has no rales.   Musculoskeletal: Normal range of motion. She exhibits no edema or tenderness.  Lymphadenopathy:    She has cervical adenopathy.  Neurological: She is alert and oriented to person, place, and time.  Skin: Skin is warm and dry. No rash noted. No erythema.  Psychiatric: She has a normal mood and affect. Her behavior is normal.  Nursing note and vitals reviewed.   ED Course  Procedures (including critical care time) Labs Review Labs Reviewed - No data to display  Imaging Review No results found. I have personally reviewed and evaluated these images and lab results as part of my medical decision-making.   EKG Interpretation None      MDM   Final diagnoses:  URI (upper respiratory infection)    Pt with symptoms consistent with viral URI/sinusitis.  Well appearing here.  No signs of breathing difficulty  No signs of pharyngitis, otitis or abnormal abdominal findings.        Gwyneth Sprout, MD 10/06/15 1239

## 2015-10-06 NOTE — Discharge Instructions (Signed)
Use nasal spray 2 times a day for 3 days only then discard Upper Respiratory Infection, Adult Most upper respiratory infections (URIs) are a viral infection of the air passages leading to the lungs. A URI affects the nose, throat, and upper air passages. The most common type of URI is nasopharyngitis and is typically referred to as "the common cold." URIs run their course and usually go away on their own. Most of the time, a URI does not require medical attention, but sometimes a bacterial infection in the upper airways can follow a viral infection. This is called a secondary infection. Sinus and middle ear infections are common types of secondary upper respiratory infections. Bacterial pneumonia can also complicate a URI. A URI can worsen asthma and chronic obstructive pulmonary disease (COPD). Sometimes, these complications can require emergency medical care and may be life threatening.  CAUSES Almost all URIs are caused by viruses. A virus is a type of germ and can spread from one person to another.  RISKS FACTORS You may be at risk for a URI if:   You smoke.   You have chronic heart or lung disease.  You have a weakened defense (immune) system.   You are very young or very old.   You have nasal allergies or asthma.  You work in crowded or poorly ventilated areas.  You work in health care facilities or schools. SIGNS AND SYMPTOMS  Symptoms typically develop 2-3 days after you come in contact with a cold virus. Most viral URIs last 7-10 days. However, viral URIs from the influenza virus (flu virus) can last 14-18 days and are typically more severe. Symptoms may include:   Runny or stuffy (congested) nose.   Sneezing.   Cough.   Sore throat.   Headache.   Fatigue.   Fever.   Loss of appetite.   Pain in your forehead, behind your eyes, and over your cheekbones (sinus pain).  Muscle aches.  DIAGNOSIS  Your health care provider may diagnose a URI  by:  Physical exam.  Tests to check that your symptoms are not due to another condition such as:  Strep throat.  Sinusitis.  Pneumonia.  Asthma. TREATMENT  A URI goes away on its own with time. It cannot be cured with medicines, but medicines may be prescribed or recommended to relieve symptoms. Medicines may help:  Reduce your fever.  Reduce your cough.  Relieve nasal congestion. HOME CARE INSTRUCTIONS   Take medicines only as directed by your health care provider.   Gargle warm saltwater or take cough drops to comfort your throat as directed by your health care provider.  Use a warm mist humidifier or inhale steam from a shower to increase air moisture. This may make it easier to breathe.  Drink enough fluid to keep your urine clear or pale yellow.   Eat soups and other clear broths and maintain good nutrition.   Rest as needed.   Return to work when your temperature has returned to normal or as your health care provider advises. You may need to stay home longer to avoid infecting others. You can also use a face mask and careful hand washing to prevent spread of the virus.  Increase the usage of your inhaler if you have asthma.   Do not use any tobacco products, including cigarettes, chewing tobacco, or electronic cigarettes. If you need help quitting, ask your health care provider. PREVENTION  The best way to protect yourself from getting a cold is to practice  good hygiene.   Avoid oral or hand contact with people with cold symptoms.   Wash your hands often if contact occurs.  There is no clear evidence that vitamin C, vitamin E, echinacea, or exercise reduces the chance of developing a cold. However, it is always recommended to get plenty of rest, exercise, and practice good nutrition.  SEEK MEDICAL CARE IF:   You are getting worse rather than better.   Your symptoms are not controlled by medicine.   You have chills.  You have worsening shortness of  breath.  You have brown or red mucus.  You have yellow or brown nasal discharge.  You have pain in your face, especially when you bend forward.  You have a fever.  You have swollen neck glands.  You have pain while swallowing.  You have white areas in the back of your throat. SEEK IMMEDIATE MEDICAL CARE IF:   You have severe or persistent:  Headache.  Ear pain.  Sinus pain.  Chest pain.  You have chronic lung disease and any of the following:  Wheezing.  Prolonged cough.  Coughing up blood.  A change in your usual mucus.  You have a stiff neck.  You have changes in your:  Vision.  Hearing.  Thinking.  Mood. MAKE SURE YOU:   Understand these instructions.  Will watch your condition.  Will get help right away if you are not doing well or get worse.   This information is not intended to replace advice given to you by your health care provider. Make sure you discuss any questions you have with your health care provider.   Document Released: 02/21/2001 Document Revised: 01/12/2015 Document Reviewed: 12/03/2013 Elsevier Interactive Patient Education [employer]

## 2015-10-06 NOTE — ED Notes (Signed)
C/o sinus pain, head congestion x 2 days-NAD-steady gait

## 2015-10-21 ENCOUNTER — Ambulatory Visit (INDEPENDENT_AMBULATORY_CARE_PROVIDER_SITE_OTHER): Payer: Medicaid Other | Admitting: Obstetrics & Gynecology

## 2015-10-21 ENCOUNTER — Encounter: Payer: Self-pay | Admitting: Obstetrics & Gynecology

## 2015-10-21 VITALS — BP 129/92 | HR 74 | Temp 98.6°F | Wt 167.8 lb

## 2015-10-21 DIAGNOSIS — N915 Oligomenorrhea, unspecified: Secondary | ICD-10-CM

## 2015-10-21 DIAGNOSIS — N898 Other specified noninflammatory disorders of vagina: Secondary | ICD-10-CM

## 2015-10-21 LAB — POCT PREGNANCY, URINE: PREG TEST UR: NEGATIVE

## 2015-10-21 MED ORDER — MEDROXYPROGESTERONE ACETATE 10 MG PO TABS: 10.0000 mg | ORAL_TABLET | Freq: Every day | ORAL | Status: DC

## 2015-10-21 MED ORDER — FLUCONAZOLE 150 MG PO TABS: 150.0000 mg | ORAL_TABLET | Freq: Once | ORAL | Status: DC

## 2015-10-21 NOTE — Progress Notes (Signed)
Patient ID: Jasmine Castaneda, female   DOB: [birth year], 35 y.o.   MRN: [id number] History:  35 y.o. G1P0101 here today with h/o PCOS and now no cycle for the past 50 days.  She reports that she normally has a cycle q 45-50 days. Pt reports that she has been having pain on her left side for >1 year. She reports that she has BM's 2x/week.  She has not taken anything to manage the pain.      The following portions of the patient's history were reviewed and updated as appropriate: allergies, current medications, past family history, past medical history, past social history, past surgical history and problem list.  Review of Systems:  Pertinent items are noted in HPI.  Objective:  Physical Exam Blood pressure 129/92, pulse 74, temperature 98.6 F (37 C), weight 167 lb 12.8 oz (76.114 kg). Gen: NAD Abd: Soft, nontender and nondistended Pelvic: Normal appearing external genitalia; normal appearing vaginal mucosa and cervix.  Pt with thick white clumpt discahrge.  Small uterus, no other palpable masses, no uterine or adnexal tenderness   Assessment & Plan:  Chronic pelvic pain- suspect secondary to constipation Yeast vaginitis  UPT- if neg see below Diflucan  po q day x1 F/u wet smear and wet mount Provera  po q day for 10 days   Jewel Mcafee L. Harraway-Smith, M.D., [place]

## 2015-10-21 NOTE — Patient Instructions (Signed)
Constipation, Adult °Constipation is when a person has fewer than three bowel movements a week, has difficulty having a bowel movement, or has stools that are dry, hard, or larger than normal. As people grow older, constipation is more common. A low-fiber diet, not taking in enough fluids, and taking certain medicines may make constipation worse.  °CAUSES  °· Certain medicines, such as antidepressants, pain medicine, iron supplements, antacids, and water pills.   °· Certain diseases, such as diabetes, irritable bowel syndrome (IBS), thyroid disease, or depression.   °· Not drinking enough water.   °· Not eating enough fiber-rich foods.   °· Stress or travel.   °· Lack of physical activity or exercise.   °· Ignoring the urge to have a bowel movement.   °· Using laxatives too much.   °SIGNS AND SYMPTOMS  °· Having fewer than three bowel movements a week.   °· Straining to have a bowel movement.   °· Having stools that are hard, dry, or larger than normal.   °· Feeling full or bloated.   °· Pain in the lower abdomen.   °· Not feeling relief after having a bowel movement.   °DIAGNOSIS  °Your health care provider will take a medical history and perform a physical exam. Further testing may be done for severe constipation. Some tests may include: °· A barium enema X-ray to examine your rectum, colon, and, sometimes, your small intestine.   °· A sigmoidoscopy to examine your lower colon.   °· A colonoscopy to examine your entire colon. °TREATMENT  °Treatment will depend on the severity of your constipation and what is causing it. Some dietary treatments include drinking more fluids and eating more fiber-rich foods. Lifestyle treatments may include regular exercise. If these diet and lifestyle recommendations do not help, your health care provider may recommend taking over-the-counter laxative medicines to help you have bowel movements. Prescription medicines may be prescribed if over-the-counter medicines do not work.    °HOME CARE INSTRUCTIONS  °· Eat foods that have a lot of fiber, such as fruits, vegetables, whole grains, and beans. °· Limit foods high in fat and processed sugars, such as french fries, hamburgers, cookies, candies, and soda.   °· A fiber supplement may be added to your diet if you cannot get enough fiber from foods.   °· Drink enough fluids to keep your urine clear or pale yellow.   °· Exercise regularly or as directed by your health care provider.   °· Go to the restroom when you have the urge to go. Do not hold it.   °· Only take over-the-counter or prescription medicines as directed by your health care provider. Do not take other medicines for constipation without talking to your health care provider first.   °SEEK IMMEDIATE MEDICAL CARE IF:  °· You have bright red blood in your stool.   °· Your constipation lasts for more than 4 days or gets worse.   °· You have abdominal or rectal pain.   °· You have thin, pencil-like stools.   °· You have unexplained weight loss. °MAKE SURE YOU:  °· Understand these instructions. °· Will watch your condition. °· Will get help right away if you are not doing well or get worse. °  °This information is not intended to replace advice given to you by your health care provider. Make sure you discuss any questions you have with your health care provider. °  °Document Released: 05/26/2004 Document Revised: 09/18/2014 Document Reviewed: 06/09/2013 °Elsevier Interactive Patient Education ©2016 [employer] ° °Monilial Vaginitis °Vaginitis in a soreness, swelling and redness (inflammation) of the vagina and vulva. Monilial vaginitis is not   a sexually transmitted infection. °CAUSES  °Yeast vaginitis is caused by yeast (candida) that is normally found in your vagina. With a yeast infection, the candida has overgrown in number to a point that upsets the chemical balance. °SYMPTOMS  °· White, thick vaginal discharge. °· Swelling, itching, redness and irritation of the vagina and  possibly the lips of the vagina (vulva). °· Burning or painful urination. °· Painful intercourse. °DIAGNOSIS  °Things that may contribute to monilial vaginitis are: °· Postmenopausal and virginal states. °· Pregnancy. °· Infections. °· Being tired, sick or stressed, especially if you had monilial vaginitis in the past. °· Diabetes. Good control will help lower the chance. °· Birth control pills. °· Tight fitting garments. °· Using bubble bath, feminine sprays, douches or deodorant tampons. °· Taking certain medications that kill germs (antibiotics). °· Sporadic recurrence can occur if you become ill. °TREATMENT  °Your caregiver will give you medication. °· There are several kinds of anti monilial vaginal creams and suppositories specific for monilial vaginitis. For recurrent yeast infections, use a suppository or cream in the vagina 2 times a week, or as directed. °· Anti-monilial or steroid cream for the itching or irritation of the vulva may also be used. Get your caregiver's permission. °· Painting the vagina with methylene blue solution may help if the monilial cream does not work. °· Eating yogurt may help prevent monilial vaginitis. °HOME CARE INSTRUCTIONS  °· Finish all medication as prescribed. °· Do not have sex until treatment is completed or after your caregiver tells you it is okay. °· Take warm sitz baths. °· Do not douche. °· Do not use tampons, especially scented ones. °· Wear cotton underwear. °· Avoid tight pants and panty hose. °· Tell your sexual partner that you have a yeast infection. They should go to their caregiver if they have symptoms such as mild rash or itching. °· Your sexual partner should be treated as well if your infection is difficult to eliminate. °· Practice safer sex. Use condoms. °· Some vaginal medications cause latex condoms to fail. Vaginal medications that harm condoms are: °¨ Cleocin cream. °¨ Butoconazole (Femstat®). °¨ Terconazole (Terazol®) vaginal  suppository. °¨ Miconazole (Monistat®) (may be purchased over the counter). °SEEK MEDICAL CARE IF:  °· You have a temperature by mouth above 102° F (38.9° C). °· The infection is getting worse after 2 days of treatment. °· The infection is not getting better after 3 days of treatment. °· You develop blisters in or around your vagina. °· You develop vaginal bleeding, and it is not your menstrual period. °· You have pain when you urinate. °· You develop intestinal problems. °· You have pain with sexual intercourse. °  °This information is not intended to replace advice given to you by your health care provider. Make sure you discuss any questions you have with your health care provider. °  °Document Released: 06/07/2005 Document Revised: 11/20/2011 Document Reviewed: 03/01/2015 °Elsevier Interactive Patient Education ©2016 [employer]. ° °

## 2015-10-22 ENCOUNTER — Other Ambulatory Visit: Payer: Self-pay | Admitting: Obstetrics & Gynecology

## 2015-10-22 DIAGNOSIS — B9689 Other specified bacterial agents as the cause of diseases classified elsewhere: Secondary | ICD-10-CM

## 2015-10-22 DIAGNOSIS — N760 Acute vaginitis: Principal | ICD-10-CM

## 2015-10-22 LAB — WET PREP, GENITAL
TRICH WET PREP: NONE SEEN
Yeast Wet Prep HPF POC: NONE SEEN

## 2015-10-22 MED ORDER — CLINDAMYCIN HCL 300 MG PO CAPS
300.0000 mg | ORAL_CAPSULE | Freq: Two times a day (BID) | ORAL | Status: DC
Start: 2015-10-22 — End: 2015-11-16

## 2015-10-26 ENCOUNTER — Telehealth: Payer: Self-pay

## 2015-10-26 NOTE — Progress Notes (Signed)
This has already been called in to patient pharmacy

## 2015-10-26 NOTE — Telephone Encounter (Signed)
Pt called and stated that she was prescribed diflucan but she is still having a heavy discharge with odor.  Called pt and informed pt that she does have BV and that an Rx of clindamycin was prescribed and sent to her [facility] pharmacy off [address]   Pt stated understanding with no further questions. Contacted pt's pharmacy to verify that clindamycin is still avaiable for pick-up; was informed yes.

## 2015-10-28 ENCOUNTER — Encounter: Payer: Self-pay | Admitting: Gastroenterology

## 2015-11-08 ENCOUNTER — Telehealth: Payer: Self-pay | Admitting: *Deleted

## 2015-11-08 NOTE — Telephone Encounter (Signed)
Called patient, no answer- left message stating we are trying to reach you to return your phone call, please call us back at the clinics 

## 2015-11-08 NOTE — Telephone Encounter (Signed)
Pt left message requesting refill of Diflucan. She further states that she took the clindamycin as prescribed and nothing has changed. The problem is just as bad if not worse.

## 2015-11-09 MED ORDER — FLUCONAZOLE 150 MG PO TABS: ORAL_TABLET | ORAL | Status: DC

## 2015-11-09 NOTE — Telephone Encounter (Addendum)
Pt left message yesterday @ 1712 stating that she missed our call. Please call back. I called pt after consult w/Virginia Katrinka Blazing, CNM and advised that I have sent Rx to her pharmacy. This time she needs to repeat the dose 3 days after the first dose.  Pt voiced understanding of instructions.

## 2015-11-15 ENCOUNTER — Telehealth: Payer: Self-pay | Admitting: *Deleted

## 2015-11-15 NOTE — Telephone Encounter (Signed)
Patient called, stated received a prescription for diflucan week ago for yeast. After taking is still having discharge with odor. Asking what she needs to do now.

## 2015-11-15 NOTE — Telephone Encounter (Signed)
Called patient back. She stated she was seen a few weeks ago, was treated with diflucan for yeast, then clindamycin for bv. Then she had to take more diflucan for yeast. However she is still having issues with itching and discharge with odor. Describes the discharge a creamy looking. I recommended she avoid scented personal cleansing products and laundry soaps in case the itching was due to contact dermatitis. However I also recommended that she be seen again in the clinic in case this was something other that yeast or bv. Patient was agreeable. Scheduled to be seen 11/16/15 @ 1:30pm.

## 2015-11-16 ENCOUNTER — Ambulatory Visit (INDEPENDENT_AMBULATORY_CARE_PROVIDER_SITE_OTHER): Payer: Medicaid Other | Admitting: Certified Nurse Midwife

## 2015-11-16 VITALS — BP 127/92 | HR 85 | Temp 98.3°F | Ht 67.0 in | Wt 163.0 lb

## 2015-11-16 DIAGNOSIS — N898 Other specified noninflammatory disorders of vagina: Secondary | ICD-10-CM | POA: Diagnosis not present

## 2015-11-16 LAB — POCT PREGNANCY, URINE: Preg Test, Ur: NEGATIVE

## 2015-11-16 MED ORDER — FLUCONAZOLE 150 MG PO TABS: 150.0000 mg | ORAL_TABLET | Freq: Once | ORAL | Status: DC

## 2015-11-16 MED ORDER — CLINDAMYCIN PHOSPHATE (1 DOSE) 2 % VA CREA: 1.0000 | TOPICAL_CREAM | Freq: Once | VAGINAL | Status: DC

## 2015-11-16 NOTE — Progress Notes (Signed)
SUBJECTIVE:  35 y.o. female complains of white and creamy vaginal discharge  Denies abnormal vaginal bleeding or significant pelvic pain or fever. No UTI symptoms. Denies history of known exposure to STD.  No LMP recorded. Patient is not currently having periods (Reason: Irregular Periods).  OBJECTIVE:  She appears well, afebrile. Abdomen: benign, soft, nontender, no masses. Pelvic Exam: normal external genitalia, vulva, vagina, cervix, uterus and adnexa. Urine dipstick: not done.  ASSESSMENT:  bacterial vaginosis  PLAN:   Treatment: Cleocin cream daily x 7 days and abstain from coitus during course of treatment ROV prn if symptoms persist or worsen.

## 2015-11-17 LAB — WET PREP, GENITAL
Trich, Wet Prep: NONE SEEN
Yeast Wet Prep HPF POC: NONE SEEN

## 2015-11-19 ENCOUNTER — Telehealth: Payer: Self-pay | Admitting: *Deleted

## 2015-11-19 NOTE — Telephone Encounter (Signed)
Called Jasmine Castaneda and she states she had not had a period in over 70 days and now it has started and has clots and cramps. We discussed I am not aware of the clindamycin gel ( which she took only once) causing bleeding . We discussed could be coincidental that her period started since she had a vaginal exam and took vaginally inserted medicine

## 2015-11-19 NOTE — Telephone Encounter (Signed)
Jasmine Castaneda called and left a message yesterday afternoon stating she was prescribed clindamycin creme and is having bleeding - wants to know if that is normal.

## 2015-12-27 ENCOUNTER — Encounter: Payer: Self-pay | Admitting: Gastroenterology

## 2015-12-27 ENCOUNTER — Ambulatory Visit (INDEPENDENT_AMBULATORY_CARE_PROVIDER_SITE_OTHER): Payer: Medicaid Other | Admitting: Gastroenterology

## 2015-12-27 VITALS — BP 120/90 | HR 68 | Ht 67.0 in | Wt 167.4 lb

## 2015-12-27 DIAGNOSIS — K589 Irritable bowel syndrome without diarrhea: Secondary | ICD-10-CM

## 2015-12-27 DIAGNOSIS — K5900 Constipation, unspecified: Secondary | ICD-10-CM | POA: Diagnosis not present

## 2015-12-27 MED ORDER — LINACLOTIDE 145 MCG PO CAPS: 145.0000 ug | ORAL_CAPSULE | Freq: Every day | ORAL | Status: DC

## 2015-12-27 NOTE — Patient Instructions (Signed)
We will send Linzess to your pharmacy Use Benefiber 1 tablespoon three times a day Increase water intake to 8 oz cups 8-10 times a day  Your hemorrhoidal banding appointment is scheduled on 02/02/2016 at 4pm

## 2015-12-27 NOTE — Progress Notes (Signed)
Jasmine Castaneda    [id number]    August 19, 1981  Primary Care Physician:OSEI-BONSU,GEORGE, MD  Referring Physician: No referring provider defined for this encounter.  Chief complaint:  Constipation   HPI: 35 year old African-American female with history of polycystic ovarian syndrome here for new patient evaluation with complaints of chronic constipation, abdominal bloating and intermittent diffuse abdominal cramping. She is being chronically constipated since she was a child. Her constipation got worse and was having a bowel movement once a week with passage of hard stool pellets . She was started on MiraLAX by her OB/GYN , is taking 1 capful 3 times a day with improvement and is having bowel movements 2-3 times a week but continues to have bloating and abdominal cramping . She also has prolapsed hemorrhoids with excessive straining and has some discomfort . No anal seepage or blood per rectum. No family history of colon cancer or polyps.  Outpatient Encounter Prescriptions as of 12/27/2015  Medication Sig  . fluconazole (DIFLUCAN) 150 MG tablet Take 1 tablet (150 mg total) by mouth once.  . medroxyPROGESTERone (PROVERA) 10 MG tablet Take 1 tablet (10 mg total) by mouth daily. Use for ten days  . linaclotide (LINZESS) 145 MCG CAPS capsule Take 1 capsule (145 mcg total) by mouth daily before breakfast.  . [DISCONTINUED] Clindamycin Phosphate, 1 Dose, vaginal cream Apply 1 Applicatorful topically once.   No facility-administered encounter medications on file as of 12/27/2015.    Allergies as of 12/27/2015 - Review Complete 12/27/2015  Allergen Reaction Noted  . Septra [bactrim] Anaphylaxis 04/19/2011  . Flagyl [metronidazole] Dermatitis 04/23/2014    Past Medical History  Diagnosis Date  . Abnormal Pap smear     leep  . Infertility   . PCOS (polycystic ovarian syndrome)     Past Surgical History  Procedure Laterality Date  . Leep      Family History  Problem  Relation Age of Onset  . Hypertension Father   . Ovarian cancer Mother   . Ulcerative colitis Mother   . Cystic fibrosis Mother   . Kidney disease Maternal Uncle   . Irritable bowel syndrome Paternal Aunt   . Diabetes Maternal Grandmother   . Diabetes Maternal Grandfather   . Diabetes Paternal Grandmother   . Diabetes Paternal Grandfather     Social History   Social History  . Marital Status: Married    Spouse Name: N/A  . Number of Children: 1  . Years of Education: N/A   Occupational History  . Stylist    Social History Main Topics  . Smoking status: Never Smoker   . Smokeless tobacco: Never Used  . Alcohol Use: No  . Drug Use: No  . Sexual Activity: Not on file   Other Topics Concern  . Not on file   Social History Narrative      Review of systems: Review of Systems  Constitutional: Negative for fever and chills.  HENT: Negative.   Eyes: Negative for blurred vision.  Respiratory: Negative for cough, shortness of breath and wheezing.   Cardiovascular: Negative for chest pain and palpitations.  Gastrointestinal: as per HPI Genitourinary: Negative for dysuria, urgency, frequency and hematuria.  Musculoskeletal: Negative for myalgias, back pain and joint pain.  Skin: Negative for itching and rash.  Neurological: Negative for dizziness, tremors, focal weakness, seizures and loss of consciousness.  Endo/Heme/Allergies: Negative for environmental allergies.  Psychiatric/Behavioral: Negative for depression, suicidal ideas and hallucinations.  All other systems  reviewed and are negative.   Physical Exam: Filed Vitals:   12/27/15 1534  BP: 120/90  Pulse: 68   Gen:      No acute distress HEENT:  EOMI, sclera anicteric Neck:     No masses; no thyromegaly Lungs:    Clear to auscultation bilaterally; normal respiratory effort CV:         Regular rate and rhythm; no murmurs Abd:      + bowel sounds; soft, non-tender; no palpable masses, no distension Ext:    No  edema; adequate peripheral perfusion Skin:      Warm and dry; no rash Neuro: alert and oriented x 3 Psych: normal mood and affect  Data Reviewed: Reviewed chart in epic  Assessment and Plan/Recommendations:  35 year old female here for new patient evaluation with likely irritable bowel syndrome predominant constipation Start Linzess 145 g daily Benefiber 1 tablespoon 3 times a day Increase fluid intake to at least 8-10 cups of 8 ounces water or any liquid Once she has improvement in constipation, will consider hemorrhoidal banding for symptomatic hemorrhoids Return in 2 months  K. Scherry RanVeena Tezra Mahr , MD [phone] Mon-Fri 8a-5p [phone] after 5p, weekends, holidays

## 2015-12-31 ENCOUNTER — Other Ambulatory Visit: Payer: Self-pay | Admitting: Obstetrics & Gynecology

## 2016-01-03 ENCOUNTER — Other Ambulatory Visit: Payer: Self-pay | Admitting: Obstetrics & Gynecology

## 2016-01-27 IMAGING — US US TRANSVAGINAL NON-OB
1 series · 13 of 25 positions shown · non-contrast
Comparison: 07/07/2013

CLINICAL DATA: Amenorrhea.  Chronic pain



[Series 1: us pelvis complete · 13 of 81 slices shown]
[im 1/81]
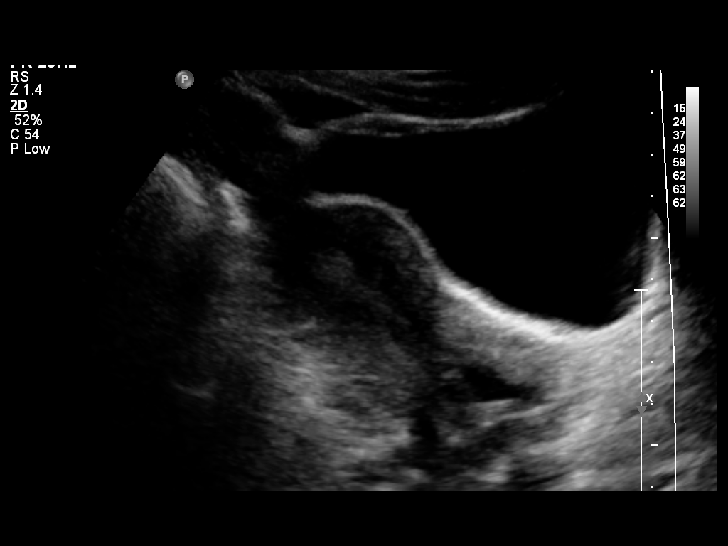
[im 7/81]
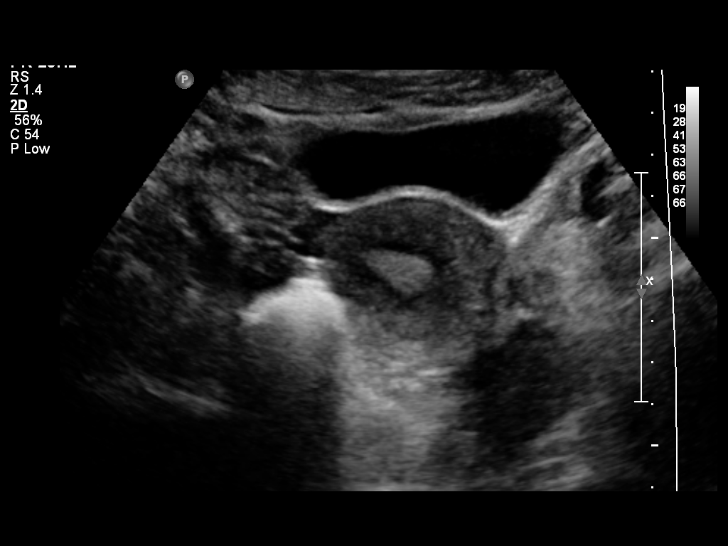
[im 14/81]
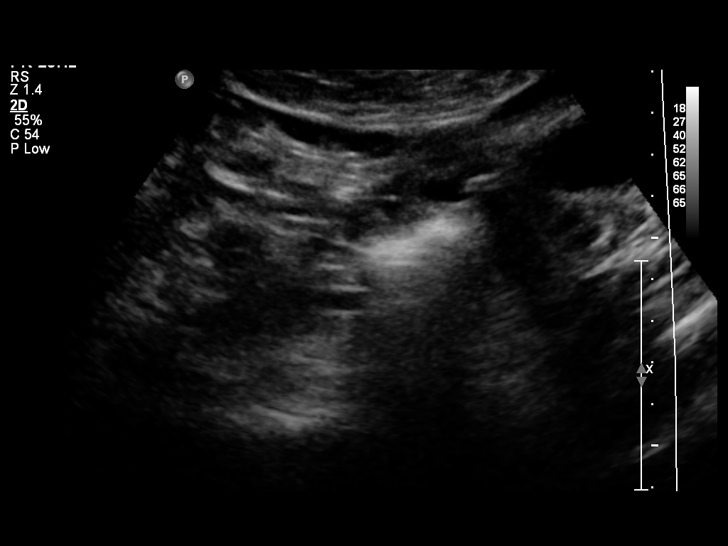
[im 21/81]
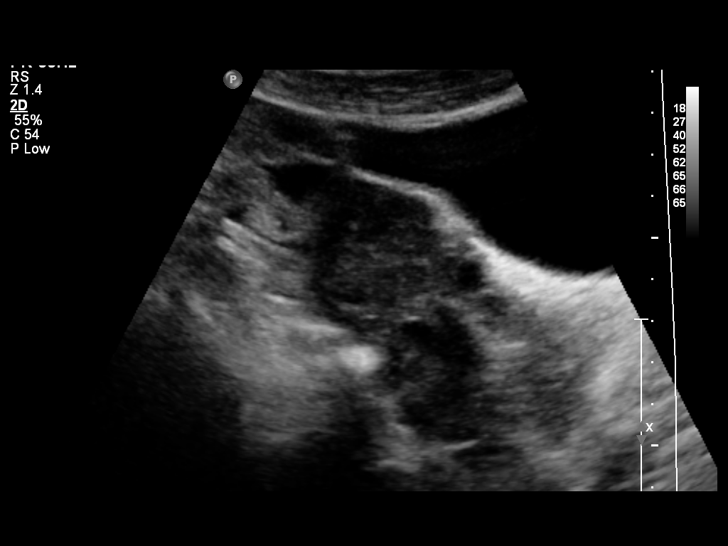
[im 27/81]
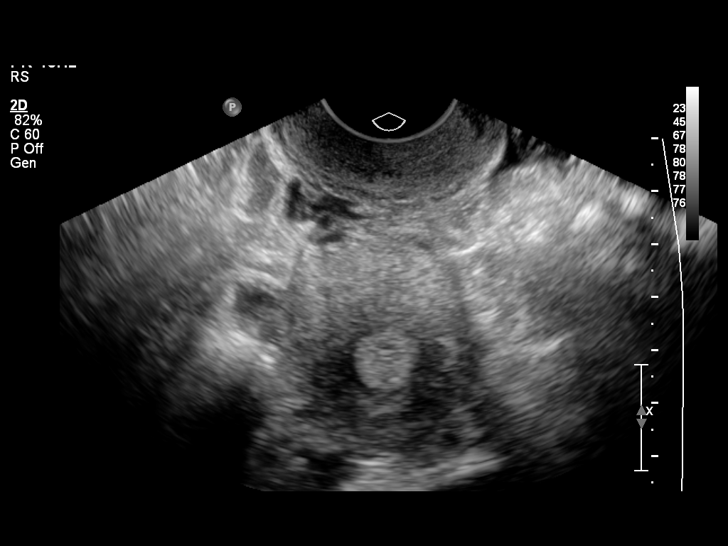
[im 34/81]
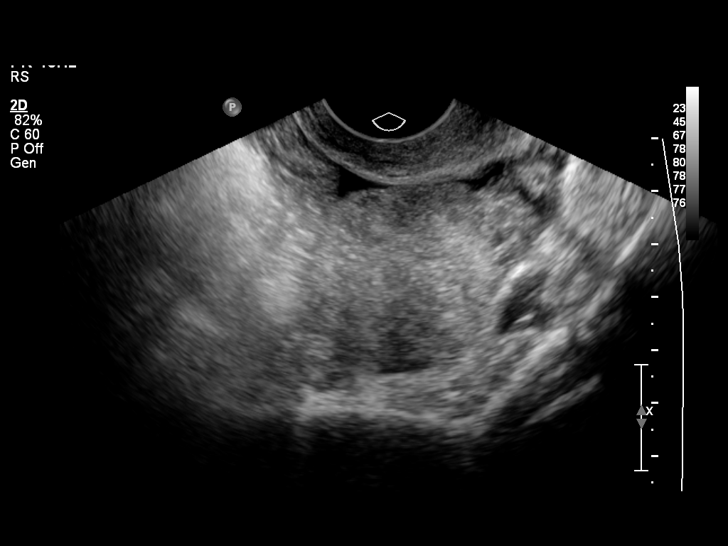
[im 41/81]
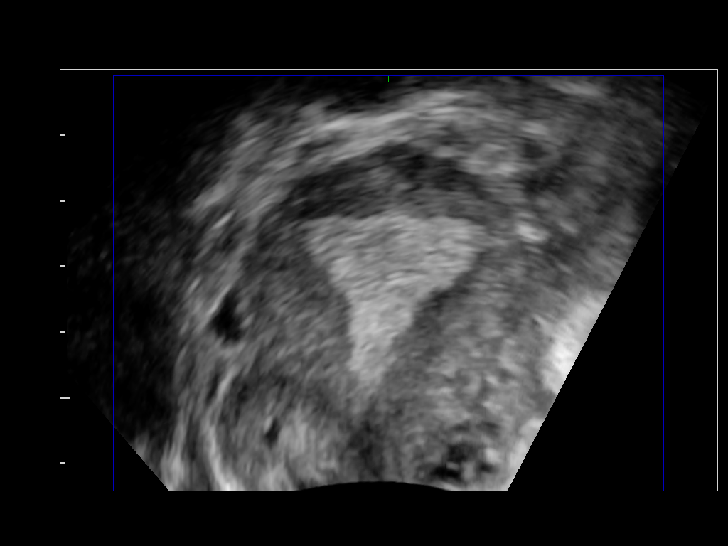
[im 47/81]
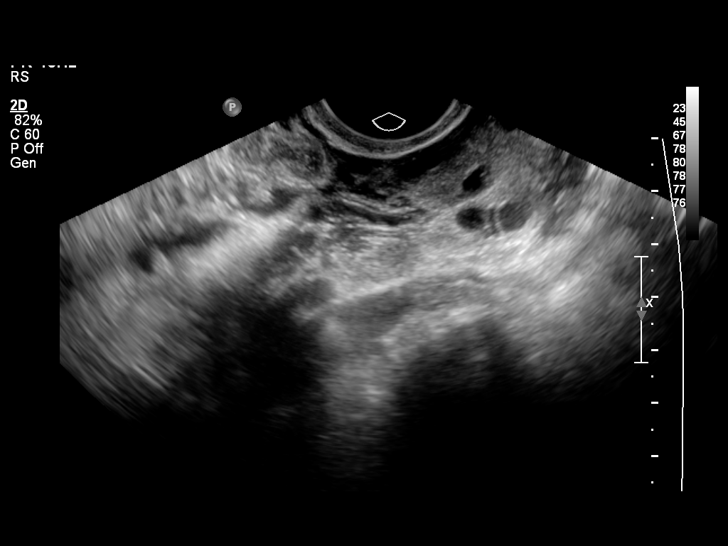
[im 54/81]
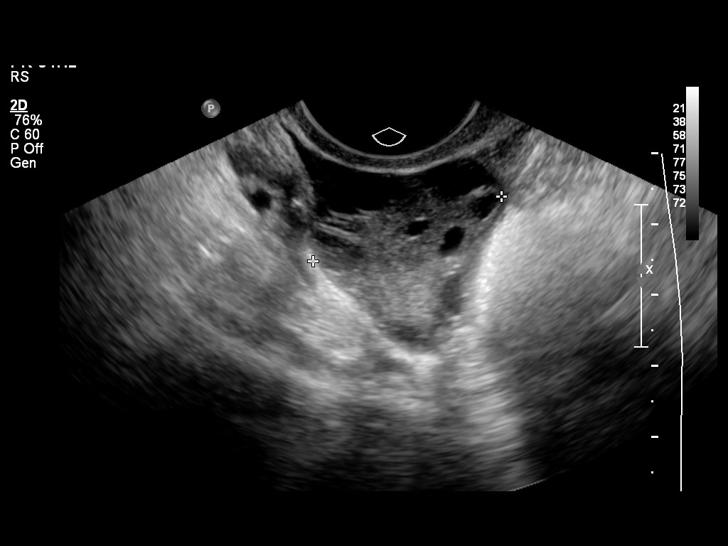
[im 61/81]
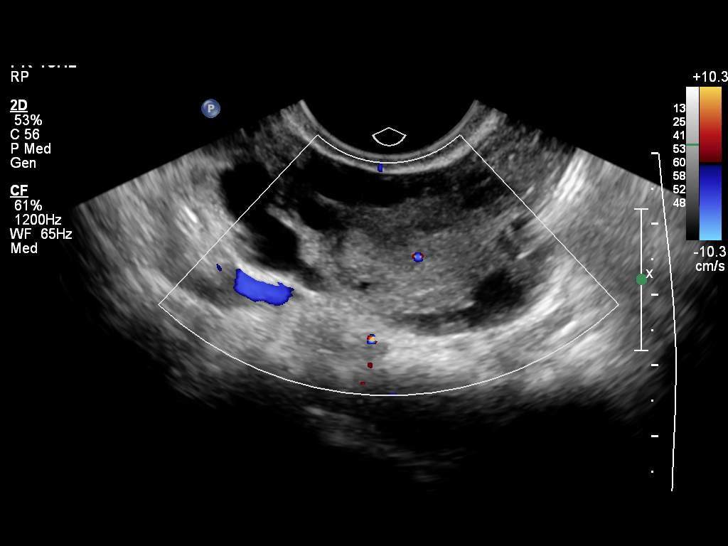
[im 67/81]
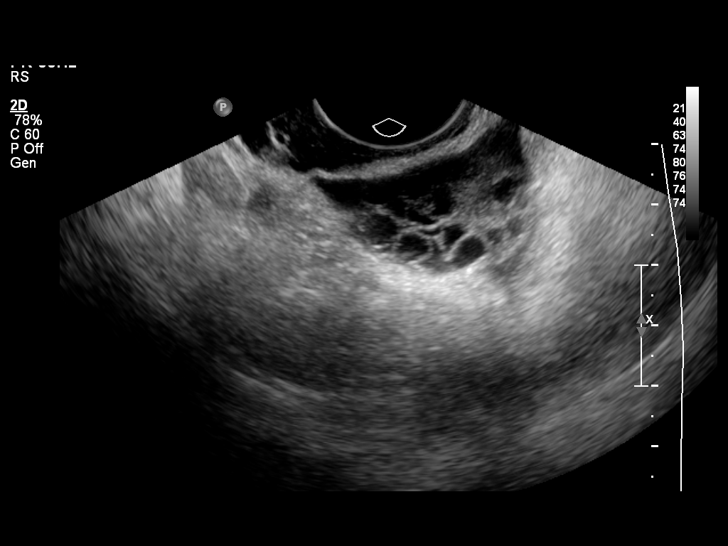
[im 74/81]
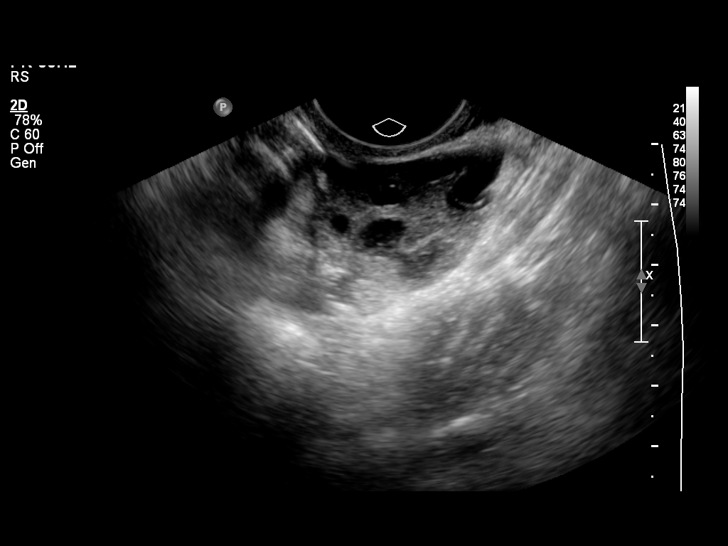
[im 81/81]
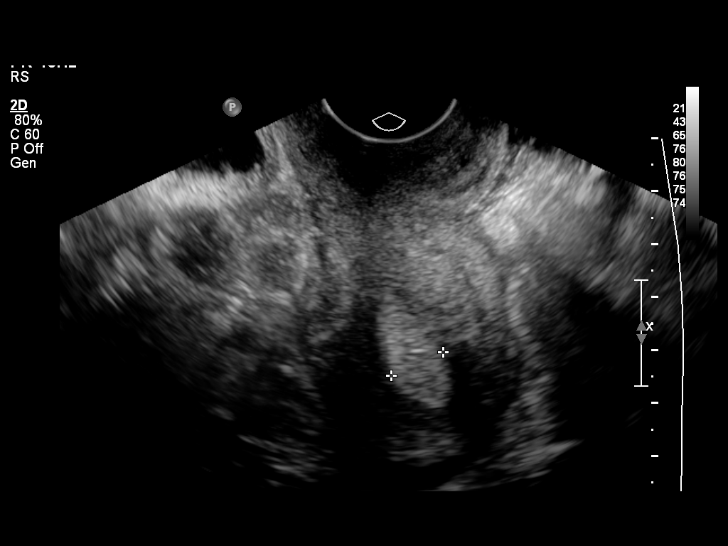

[13 of 25 positions shown; findings below may reference images not displayed]

FINDINGS: Uterus

Measurements: 5.9 x 4.0 x 4.3 cm.. No fibroids or other mass
visualized.

Endometrium

Thickness: 9.4 mm.  No focal abnormality visualized.

Right ovary

Measurements: 3.9 x 2.6 x 2.8 cm. The volume is equal to 14 cc.
Numerous peripheral follicles measuring less than 1 cm are
identified. No adnexal mass.

Left ovary

Measurements: 4 x 2.6 x 3.4 cm. The volume is equal to 17.7 cc.
Numerous peripheral follicles are identified. No suspicious adnexal
mass.

Other findings

Small amount of free fluid in the left adnexa noted.
IMPRESSION: 1. Findings meet sonographic criteria for PCOS ; per 6226 consensus
conference statement by the European Society for Human Reproduction
and Embryology and the American Society for Reproductive Medicine.

## 2016-02-02 ENCOUNTER — Encounter: Payer: Self-pay | Admitting: Gastroenterology

## 2016-02-02 ENCOUNTER — Ambulatory Visit (INDEPENDENT_AMBULATORY_CARE_PROVIDER_SITE_OTHER): Payer: Medicaid Other | Admitting: Gastroenterology

## 2016-02-02 VITALS — BP 110/74 | HR 68 | Ht 67.0 in | Wt 171.8 lb

## 2016-02-02 DIAGNOSIS — K5900 Constipation, unspecified: Secondary | ICD-10-CM

## 2016-02-02 MED ORDER — LINACLOTIDE 290 MCG PO CAPS
290.0000 ug | ORAL_CAPSULE | Freq: Every day | ORAL | Status: DC
Start: 2016-02-02 — End: 2018-01-29

## 2016-02-02 NOTE — Progress Notes (Signed)
Jasmine Castaneda    [id number]    1981/04/17  Primary Care Physician:Castaneda,GEORGE, MD  Referring Physician: Jackie PlumGeorge Osei-Bonsu, MD [id number] [place], [place] [id number]  Chief complaint:  Constipation  HPI: [age] yr F with h/o chronic constipation here for follow up visit. Patient reports improvement with Linzess 145mcg daily but feels is unable to evacuate completely. She is currently having bowel movement once every 2-3 days from once every 1-2 weeks before. Denies any nausea, vomiting, abdominal pain, melena or bright red blood per rectum She feels hemorrhoids are not bothering anymore and doesn't feel she needs band ligation.     Outpatient Encounter Prescriptions as of 02/02/2016  Medication Sig  . medroxyPROGESTERone (PROVERA) 10 MG tablet Take 1 tablet (10 mg total) by mouth daily. Use for ten days  . [DISCONTINUED] linaclotide (LINZESS) 145 MCG CAPS capsule Take 1 capsule (145 mcg total) by mouth daily before breakfast.  . linaclotide (LINZESS) 290 MCG CAPS capsule Take 1 capsule (290 mcg total) by mouth daily before breakfast.  . [DISCONTINUED] fluconazole (DIFLUCAN) 150 MG tablet Take 1 tablet (150 mg total) by mouth once.  . [DISCONTINUED] fluconazole (DIFLUCAN) 150 MG tablet TAKE 1 TABLET BY MOUTH AS ONE DOSE. CAN TAKE ADDITIONAL DOSE 3 DAYS LATER IF SYMPTOMS PERSIST   No facility-administered encounter medications on file as of 02/02/2016.    Allergies as of 02/02/2016 - Review Complete 02/02/2016  Allergen Reaction Noted  . Septra [bactrim] Anaphylaxis 04/19/2011  . Flagyl [metronidazole] Dermatitis 04/23/2014    Past Medical History  Diagnosis Date  . Abnormal Pap smear     leep  . Infertility   . PCOS (polycystic ovarian syndrome)     Past Surgical History  Procedure Laterality Date  . Leep      Family History  Problem Relation Age of Onset  . Hypertension Father   . Ovarian cancer Mother   . Ulcerative colitis Mother    . Cystic fibrosis Mother   . Kidney disease Maternal Uncle   . Irritable bowel syndrome Paternal Aunt   . Diabetes Maternal Grandmother   . Diabetes Maternal Grandfather   . Diabetes Paternal Grandmother   . Diabetes Paternal Grandfather     Social History   Social History  . Marital Status: Married    Spouse Name: N/A  . Number of Children: 1  . Years of Education: N/A   Occupational History  . Stylist    Social History Main Topics  . Smoking status: Never Smoker   . Smokeless tobacco: Never Used  . Alcohol Use: No  . Drug Use: No  . Sexual Activity: Not on file   Other Topics Concern  . Not on file   Social History Narrative      Review of systems: Review of Systems  Constitutional: Negative for fever and chills.  HENT: Negative.   Eyes: Negative for blurred vision.  Respiratory: Negative for cough, shortness of breath and wheezing.   Cardiovascular: Negative for chest pain and palpitations.  Gastrointestinal: as per HPI Genitourinary: Negative for dysuria, urgency, frequency and hematuria.  Musculoskeletal: Negative for myalgias, back pain and joint pain.  Skin: Negative for itching and rash.  Neurological: Negative for dizziness, tremors, focal weakness, seizures and loss of consciousness.  Endo/Heme/Allergies: Negative for environmental allergies.  Psychiatric/Behavioral: Negative for depression, suicidal ideas and hallucinations.  All other systems reviewed and are negative.   Physical Exam: Filed Vitals:  02/02/16 1618  BP: 110/74  Pulse: 68   Gen:      No acute distress HEENT:  EOMI, sclera anicteric Neck:     No masses; no thyromegaly Lungs:    Clear to auscultation bilaterally; normal respiratory effort CV:         Regular rate and rhythm; no murmurs Abd:      + bowel sounds; soft, non-tender; no palpable masses, no distension Ext:    No edema; adequate peripheral perfusion Skin:      Warm and dry; no rash Neuro: alert and oriented x  3 Psych: normal mood and affect  Data Reviewed:  Reviewed chart in epic   Assessment and Plan/Recommendations: 34 yr F with c/o chronic constipation is here for follow up visit Increase linzess to daily Advised to increase fluid intake and dietary fiber Return as needed   [name] , MD [phone] Mon-Fri 8a-5p [phone] after 5p, weekends, holidays  CC: Jasmine Plum, MD

## 2016-02-02 NOTE — Patient Instructions (Signed)
We have sent the following medications to your pharmacy for you to pick up at your convenience: Linzess Follow up as needed

## 2016-03-18 ENCOUNTER — Other Ambulatory Visit: Payer: Self-pay | Admitting: Obstetrics & Gynecology

## 2016-03-30 ENCOUNTER — Other Ambulatory Visit: Payer: Self-pay | Admitting: Obstetrics & Gynecology

## 2016-04-04 ENCOUNTER — Other Ambulatory Visit (HOSPITAL_COMMUNITY)
Admission: RE | Admit: 2016-04-04 | Discharge: 2016-04-04 | Disposition: A | Discharge status: Home / Self Care | Payer: Medicaid Other | Source: Ambulatory Visit | Attending: Family Medicine | Admitting: Family Medicine

## 2016-04-04 ENCOUNTER — Ambulatory Visit (INDEPENDENT_AMBULATORY_CARE_PROVIDER_SITE_OTHER): Payer: Medicaid Other | Admitting: Family Medicine

## 2016-04-04 ENCOUNTER — Encounter: Payer: Self-pay | Admitting: Family Medicine

## 2016-04-04 VITALS — BP 130/97 | Wt 167.7 lb

## 2016-04-04 DIAGNOSIS — N970 Female infertility associated with anovulation: Secondary | ICD-10-CM | POA: Diagnosis not present

## 2016-04-04 DIAGNOSIS — N939 Abnormal uterine and vaginal bleeding, unspecified: Secondary | ICD-10-CM

## 2016-04-04 DIAGNOSIS — Z113 Encounter for screening for infections with a predominantly sexual mode of transmission: Secondary | ICD-10-CM | POA: Diagnosis not present

## 2016-04-04 DIAGNOSIS — E282 Polycystic ovarian syndrome: Secondary | ICD-10-CM | POA: Diagnosis not present

## 2016-04-04 LAB — CREATININE, SERUM: Creat: 0.92 mg/dL (ref 0.50–1.10)

## 2016-04-04 NOTE — Patient Instructions (Signed)
Polycystic Ovarian Syndrome Polycystic ovarian syndrome (PCOS) is a common hormonal disorder among women of reproductive age. Most women with PCOS grow many small cysts on their ovaries. PCOS can cause problems with your periods and make it difficult to get pregnant. It can also cause an increased risk of miscarriage with pregnancy. If left untreated, PCOS can lead to serious health problems, such as diabetes and heart disease. CAUSES The cause of PCOS is not fully understood, but genetics may be a factor. SIGNS AND SYMPTOMS   Infrequent or no menstrual periods.   Inability to get pregnant (infertility) because of not ovulating.   Increased growth of hair on the face, chest, stomach, back, thumbs, thighs, or toes.   Acne, oily skin, or dandruff.   Pelvic pain.   Weight gain or obesity, usually carrying extra weight around the waist.   Type 2 diabetes.   High cholesterol.   High blood pressure.   Female-pattern baldness or thinning hair.   Patches of thickened and dark brown or black skin on the neck, arms, breasts, or thighs.   Tiny excess flaps of skin (skin tags) in the armpits or neck area.   Excessive snoring and having breathing stop at times while asleep (sleep apnea).   Deepening of the voice.   Gestational diabetes when pregnant.  DIAGNOSIS  There is no single test to diagnose PCOS.   Your health care provider will:   Take a medical history.   Perform a pelvic exam.   Have ultrasonography done.   Check your female and female hormone levels.   Measure glucose or sugar levels in the blood.   Do other blood tests.   If you are producing too many female hormones, your health care provider will make sure it is from PCOS. At the physical exam, your health care provider will want to evaluate the areas of increased hair growth. Try to allow natural hair growth for a few days before the visit.   During a pelvic exam, the ovaries may be enlarged  or swollen because of the increased number of small cysts. This can be seen more easily by using vaginal ultrasonography or screening to examine the ovaries and lining of the uterus (endometrium) for cysts. The uterine lining may become thicker if you have not been having a regular period.  TREATMENT  Because there is no cure for PCOS, it needs to be managed to prevent problems. Treatments are based on your symptoms. Treatment is also based on whether you want to have a baby or whether you need contraception.  Treatment may include:   Progesterone hormone to start a menstrual period.   Birth control pills to make you have regular menstrual periods.   Medicines to make you ovulate, if you want to get pregnant.   Medicines to control your insulin.   Medicine to control your blood pressure.   Medicine and diet to control your high cholesterol and triglycerides in your blood.  Medicine to reduce excessive hair growth.  Surgery, making small holes in the ovary, to decrease the amount of female hormone production. This is done through a long, lighted tube (laparoscope) placed into the pelvis through a tiny incision in the lower abdomen.  HOME CARE INSTRUCTIONS  Only take over-the-counter or prescription medicine as directed by your health care provider.  Pay attention to the foods you eat and your activity levels. This can help reduce the effects of PCOS.  Keep your weight under control.  Eat foods that are   low in carbohydrate and high in fiber.  Exercise regularly. SEEK MEDICAL CARE IF:  Your symptoms do not get better with medicine.  You have new symptoms.   This information is not intended to replace advice given to you by your health care provider. Make sure you discuss any questions you have with your health care provider.   Document Released: 12/22/2004 Document Revised: 06/18/2013 Document Reviewed: 02/13/2013 Elsevier Interactive Patient Education [employer].    Infertility Infertility is when you are unable to get pregnant (conceive) after a year of having sex regularly without using birth control. Infertility can also mean that a woman is not able to carry a pregnancy to full term.  Both women and men can have fertility problems. WHAT CAUSES INFERTILITY? What Causes Infertility in Women? There are many possible causes of infertility in women. For some women, the cause of infertility is not known (unexplained infertility). Infertility can also be linked to more than one cause. Infertility problems in women can be caused by problems with the menstrual cycle or reproductive organs, certain medical conditions, and factors related to lifestyle and age.  Problems with your menstrual cycle can interfere with your ovaries producing eggs (ovulation). This can make it difficult to get pregnant. This includes having a menstrual cycle that is very long, very short, or irregular.  Problems with reproductive organs can include:  An abnormally narrow cervix or a cervix that does not remain closed during a pregnancy.  A blockage in your fallopian tubes.  An abnormally shaped uterus.  Uterine fibroids. This is a tissue mass (tumor) that can develop on your uterus.  Medical conditions that can affect a woman's fertility include:  Polycystic ovarian syndrome (PCOS). This is a hormonal disorder that can cause small cysts to grow on your ovaries. This is the most common cause of infertility in women.  Endometriosis. This is a condition in which the tissue that lines your uterus (endometrium) grows outside of its normal location.  Primary ovary insufficiency. This is when your ovaries stop producing eggs and hormones before the age of 74.  Sexually transmitted diseases, such as chlamydia or gonorrhea. These infections can cause scarring in your fallopian tubes. This makes it difficult for eggs to reach your uterus.  Autoimmune disorders. These are  disorders in which your immune system attacks normal, healthy cells.  Hormone imbalances.  Other factors include:  Age. A woman's fertility declines with age, especially after her mid-30s.  Being under- or overweight.  Drinking too much alcohol.  Using drugs.  Exercising excessively.  Being exposed to environmental toxins, such as radiation, pesticides, and certain chemicals. What Causes Infertility in Men? There are many causes of infertility in men. Infertility can be linked to more than one cause. Infertility problems in men can be caused by problems with sperm or the reproductive organs, certain medical conditions, and factors related to lifestyle and age. Some men have unexplained infertility.   Problems with sperm. Infertility can result if there is a problem producing:  Enough sperm (low sperm count).  Enough normally-shaped sperm (sperm morphology).  Sperm that are able to reach the egg (poor motility).  Infertility can also be caused by:  A problem with hormones.  Enlarged veins (varicoceles), cysts (spermatoceles), or tumors of the testicles.  Sexual dysfunction.  Injury to the testicles.  A birth defect, such as not having the tubes that carry sperm (vas deferens).  Medical conditions that can affect a man's fertility include:  Diabetes.  Cancer treatments, such as chemotherapy or radiation.  Klinefelter syndrome. This is an inherited genetic disorder.   Thyroid problems, such as an under- or overactive thyroid.  Cystic fibrosis.  Sexually transmitted diseases.  Other factors include:  Age. A man's fertility declines with age.  Drinking too much alcohol.  Using drugs.  Being exposed to environmental toxins, such as pesticides and lead. WHAT ARE THE SYMPTOMS OF INFERTILITY? Being unable to get pregnant after one year of having regular sex without using birth control is the only sign of infertility.  HOW IS INFERTILITY DIAGNOSED? In order to  be diagnosed with infertility, both partners will have a physical exam. Both partners will also have an extensive medical and sexual history taken. If there is no obvious reason for infertility, additional tests may be done. What Tests Will Women Have? Women may first have tests to check whether they are ovulating each month. The tests may include:  Blood tests to check hormone levels.  An ultrasound of the ovaries. This looks for possible problems on or in the ovaries.  Taking a small sample of the tissue that lines the uterus for examination under a microscope (endometrial biopsy). Women who are ovulating may have additional tests. These may include:  Hysterosalpingography.  This is an X-ray of the fallopian tubes and uterus taken after a specific type of dye is injected.  This test can show the shape of the uterus and whether the fallopian tubes are open.  Laparoscopy.  In this test, a lighted tube (laparoscope) is used to look for problems in the fallopian tubes and other female organs.  Transvaginal ultrasound.  This is an imaging test to check for abnormalities of the uterus and ovaries.  A health care provider can use this test to count the number of follicles on the ovaries.  Hysteroscopy.  This test involves using a lighted tube to examine the cervix and inside the uterus.  It is done to find any abnormalities inside the uterus. What Tests Will Men Have? Tests for men's infertility includes:  Semen tests to check sperm count, morphology, and motility.  Blood tests to check for hormone levels.  Taking a small sample of tissue from inside a testicle (biopsy). This is examined under a microscope.  Blood tests to check for genetic abnormalities (genetic testing). HOW ARE WOMEN TREATED FOR INFERTILITY?  Treatment depends on the cause of infertility. Most cases of infertility in women are treated with medicine or surgery.  Women may take medicine to:  Correct  ovulation problems.  Treat other health conditions, such as PCOS.  Surgery may be done to:  Repair damage to the ovaries, fallopian tubes, cervix, or uterus.  Remove growths from the uterus.  Remove scar tissue from the uterus, pelvis, or other female organs. HOW ARE MEN TREATED FOR INFERTILITY?  Treatment depends on the cause of infertility. Most cases of infertility in men are treated with medicine or surgery.   Men may take medicine to:  Correct hormone problems.  Treat other health conditions.  Treat sexual dysfunction.  Surgery may be done to:  Remove blockages in the reproductive tract.  Correct other structural problems of the reproductive tract. WHAT IS ASSISTED REPRODUCTIVE TECHNOLOGY? Assisted reproductive technology (ART) refers to all treatments and procedures that combine eggs and sperm outside the body to try to help a couple conceive. ART is often combined with fertility drugs to stimulate ovulation. Sometimes ART is done using eggs retrieved from another woman's body (donor eggs) or from  previously frozen fertilized eggs (embryos).  There are different types of ART. These include:   Intrauterine insemination (IUI).  In this procedure, sperm is placed directly into a woman's uterus with a long, thin tube.  This may be most effective for infertility caused by sperm problems, including low sperm count and low motility.  Can be used in combination with fertility drugs.  In vitro fertilization (IVF).  This is often done when a woman's fallopian tubes are blocked or when a man has low sperm counts.  Fertility drugs stimulate the ovaries to produce multiple eggs. Once mature, these eggs are removed from the body and combined with the sperm to be fertilized.  These fertilized eggs are then placed in the woman's uterus.   This information is not intended to replace advice given to you by your health care provider. Make sure you discuss any questions you have with  your health care provider.   Document Released: 08/31/2003 Document Revised: 05/19/2015 Document Reviewed: 05/13/2014 Elsevier Interactive Patient Education [employer]

## 2016-04-04 NOTE — Progress Notes (Signed)
CLINIC ENCOUNTER NOTE  History:  35 y.o. G1P0101 here today for abnormal vaginal bleeding.  Patient has known PCOS. Last menses 02/26/16, never been regular 2/2 PCOS.   Unprotected sex with husband. Two days ago was having intercourse and had post-coital bleeding.   Pap 04/2014- normal, neg HPV.   Wanting to have kids, trying for past 6 years. Would like to start infertility treatment. Was given clomid in the past but never used it.    Past Medical History:  Diagnosis Date  . Abnormal Pap smear    leep  . Infertility   . PCOS (polycystic ovarian syndrome)     Past Surgical History:  Procedure Laterality Date  . LEEP      The following portions of the patient's history were reviewed and updated as appropriate: allergies, current medications, past family history, past medical history, past social history, past surgical history and problem list.   Health Maintenance:  Normal pap and negative HRHPV on 04/22/14.  Mammo not indicated.   Review of Systems:  See above; comprehensive review of systems was otherwise negative.   Objective:  Physical Exam BP (!) 130/97 (BP Location: Right Arm, Patient Position: Sitting, Cuff Size: Normal)   Wt 167 lb 11.2 oz (76.1 kg)   LMP 02/24/2016 (Approximate)   BMI 26.27 kg/m  CONSTITUTIONAL: Well-developed, well-nourished female in no acute distress. Body mass index is 26.27 kg/m. HENT:  Normocephalic, atraumatic SKIN: Skin is warm and dry.  NEUROLGIC: Alert  PSYCHIATRIC: Normal mood and affect.  CARDIOVASCULAR: Normal heart rate noted RESPIRATORY: Effort and breath sounds normal, no problems with respiration noted ABDOMEN: Soft, no distention noted.  No tenderness, rebound or guarding.  PELVIC: Normal appearing external genitalia; normal appearing vaginal mucosa and cervix.  Normal appearing discharge.  Normal uterine size, no other palpable masses, no uterine or adnexal tenderness.   Labs and Imaging No results found.  Assessment &  Plan:   1. Anovulation - After checking labs, will start medication to produce ovulation: metformin + Provera followed by Femara 2.5mg .   2. PCOS (polycystic ovarian syndrome) - HgB A1c - Creatinine - Reviewed TVUS for diagnosis (including oligomenorrhea)  3. Abnormal vaginal bleeding - Wet prep, genital - GC/Chlamydia Probe Amp - HIV antibody (with reflex) - POCT urine pregnancy  Routine preventative health maintenance measures emphasized.     Jen Mow, DO OB/[facility]N Fellow [facility] [employer], [facility]

## 2016-04-05 LAB — HEMOGLOBIN A1C
Hgb A1c MFr Bld: 5.1 % (ref <5.7)
MEAN PLASMA GLUCOSE: 100 mg/dL

## 2016-04-05 LAB — WET PREP, GENITAL: Trich, Wet Prep: NONE SEEN

## 2016-04-05 LAB — HIV ANTIBODY (ROUTINE TESTING W REFLEX): HIV: NONREACTIVE

## 2016-04-06 LAB — GC/CHLAMYDIA PROBE AMP (~~LOC~~) NOT AT ARMC
Chlamydia: NEGATIVE
Neisseria Gonorrhea: NEGATIVE

## 2016-04-12 ENCOUNTER — Telehealth: Payer: Self-pay | Admitting: *Deleted

## 2016-04-12 MED ORDER — CLINDAMYCIN HCL 300 MG PO CAPS: 300.0000 mg | ORAL_CAPSULE | Freq: Two times a day (BID) | ORAL | 0 refills | Status: DC

## 2016-04-12 NOTE — Telephone Encounter (Signed)
Spoke w/pt @ 1000 today and informed her of all test results from 7/25. She stated that she took 1 Diflucan yesterday although she was not having vaginal itching. She is having a discharge w/odor. I explained that this is caused by the BV. I will send Rx to her pharmacy. I will send message to Dr. Omer Jack and she will advise of other medical management as previously discussed w/pt. Minela voiced understanding.

## 2016-04-12 NOTE — Telephone Encounter (Signed)
Received a message left on nurse voicemail on 04/11/16 at 1017.  Patient states she was seen last week and would like to get her test results and know what are her next steps.  Requests a return call to [phone].

## 2016-04-12 NOTE — Telephone Encounter (Signed)
Patient called to say she had not gotten a call back. Spoke with Diane RN, and she was not able to speak with Jasmine Castaneda, but will give her a call back. Call back number is [phone]. Patient stated she would wait for her to call her back.

## 2016-05-18 ENCOUNTER — Telehealth: Payer: Self-pay | Admitting: *Deleted

## 2016-05-18 NOTE — Telephone Encounter (Addendum)
Pt left message today stating that she has left multiple messages on nurse voicemail with no call back. She is requesting refill of clindamycin and also a prescription for additional medication as suggested by the doctor. She also wants to know what are her instructions going forward. Per chart review, pt last called on 8/2 and her call was returned the same day with discussion regarding test results and Rx. No additional calls have been documented. I attempted call back to pt and was not able to get the call to connect - will try again later today.   9/20  1015  Called pt and left message that I am following up on her previous message. A prescription was sent to her pharmacy last week to treat yeast and I wanted to be sure she was aware. Also, I have sent a message to Dr. Omer JackMumaw regarding other possible prescriptions which may be prescribed as per their discussion on 7/25. I will contact her once I have had a response from Dr. Omer JackMumaw.   9/26  1100  Spoke w/Dr. Macon LargeAnyanwu regarding pt questions and situation since no response from Dr. Omer JackMumaw. She reviewed pt's chart and sent Rx for Metformin and Femara.  Pt was called and informed to pick up medications and dosing instructions.  Pt stated that she is on day #3 of her cycle and will begin the Femara today. Pt states tat Dr. Omer JackMumaw wanted to see her fro follow up 4-6 weeks after starting these medications.  Pt will be called with appt details. She voiced understanding of all information given.

## 2016-05-27 ENCOUNTER — Other Ambulatory Visit: Payer: Self-pay | Admitting: Obstetrics & Gynecology

## 2016-06-01 ENCOUNTER — Telehealth: Payer: Self-pay

## 2016-06-01 MED ORDER — CLINDAMYCIN HCL 300 MG PO CAPS: 300.0000 mg | ORAL_CAPSULE | Freq: Two times a day (BID) | ORAL | 0 refills | Status: DC

## 2016-06-01 NOTE — Telephone Encounter (Signed)
Returned pt call from the front desk and pt stated that she is having another episode of BV and she would like to have clindamycin prescribed.  Pt also stated that she thought the provider was going to prescribe her some medication for PCOS but she has not heard anything. Notified Mumaw in regards to refill on clindamycin and f/u PCOS per provider pt can have a refill on her clindamycin and the provider stated that she will call the pt herself to discuss what will be next in regards to management of her PCOS.  I informed pt of providers recommendations and that if she does not hear from the provider by Monday to please give the office a call.  Pt stated understanding with no further questions.

## 2016-06-06 MED ORDER — LETROZOLE 2.5 MG PO TABS: 2.5000 mg | ORAL_TABLET | Freq: Every day | ORAL | 2 refills | Status: DC

## 2016-06-06 MED ORDER — METFORMIN HCL 850 MG PO TABS: 850.0000 mg | ORAL_TABLET | Freq: Two times a day (BID) | ORAL | 2 refills | Status: DC

## 2016-06-06 MED ORDER — METFORMIN HCL 500 MG PO TABS: ORAL_TABLET | ORAL | 5 refills | Status: DC

## 2016-06-06 NOTE — Progress Notes (Signed)
Patient called, currently on Day of cycle. Needs medications as discussed with Dr. Omer JackMumaw.  Metformin 850 mg bid and Femara prescribed as per Dr. Karie ChimeraMumaw's plan. Patient needs to follow up in 2 months or earlier if needed.  Of note, patient has been treated unsuccessfully with Clomid in past.   Tereso NewcomerUgonna A Anyanwu, MD

## 2016-10-13 ENCOUNTER — Inpatient Hospital Stay (HOSPITAL_COMMUNITY)
Admission: AD | Admit: 2016-10-13 | Discharge: 2016-10-13 | Disposition: A | Discharge status: Home / Self Care | Payer: Medicaid Other | Source: Ambulatory Visit | Attending: Obstetrics and Gynecology | Admitting: Obstetrics and Gynecology

## 2016-10-13 ENCOUNTER — Encounter (HOSPITAL_COMMUNITY): Payer: Self-pay

## 2016-10-13 ENCOUNTER — Inpatient Hospital Stay (HOSPITAL_COMMUNITY): Payer: Medicaid Other

## 2016-10-13 DIAGNOSIS — Z3202 Encounter for pregnancy test, result negative: Secondary | ICD-10-CM | POA: Diagnosis not present

## 2016-10-13 DIAGNOSIS — Z881 Allergy status to other antibiotic agents status: Secondary | ICD-10-CM | POA: Insufficient documentation

## 2016-10-13 DIAGNOSIS — R109 Unspecified abdominal pain: Secondary | ICD-10-CM | POA: Diagnosis present

## 2016-10-13 DIAGNOSIS — R102 Pelvic and perineal pain: Secondary | ICD-10-CM

## 2016-10-13 DIAGNOSIS — N939 Abnormal uterine and vaginal bleeding, unspecified: Secondary | ICD-10-CM | POA: Diagnosis present

## 2016-10-13 DIAGNOSIS — N9489 Other specified conditions associated with female genital organs and menstrual cycle: Secondary | ICD-10-CM

## 2016-10-13 DIAGNOSIS — N7011 Chronic salpingitis: Secondary | ICD-10-CM | POA: Insufficient documentation

## 2016-10-13 DIAGNOSIS — R1909 Other intra-abdominal and pelvic swelling, mass and lump: Secondary | ICD-10-CM | POA: Diagnosis not present

## 2016-10-13 DIAGNOSIS — E282 Polycystic ovarian syndrome: Secondary | ICD-10-CM | POA: Insufficient documentation

## 2016-10-13 LAB — URINALYSIS, ROUTINE W REFLEX MICROSCOPIC
Bilirubin Urine: NEGATIVE
GLUCOSE, UA: NEGATIVE mg/dL
Ketones, ur: NEGATIVE mg/dL
Nitrite: NEGATIVE
PROTEIN: NEGATIVE mg/dL
SPECIFIC GRAVITY, URINE: 1.015 (ref 1.005–1.030)
pH: 7.5 (ref 5.0–8.0)

## 2016-10-13 LAB — CBC
HEMATOCRIT: 37.5 % (ref 36.0–46.0)
Hemoglobin: 12.9 g/dL (ref 12.0–15.0)
MCH: 30.5 pg (ref 26.0–34.0)
MCHC: 34.4 g/dL (ref 30.0–36.0)
MCV: 88.7 fL (ref 78.0–100.0)
Platelets: 267 10*3/uL (ref 150–400)
RBC: 4.23 MIL/uL (ref 3.87–5.11)
RDW: 12.3 % (ref 11.5–15.5)
WBC: 3.6 10*3/uL — ABNORMAL LOW (ref 4.0–10.5)

## 2016-10-13 LAB — WET PREP, GENITAL
Clue Cells Wet Prep HPF POC: NONE SEEN
Sperm: NONE SEEN
Trich, Wet Prep: NONE SEEN
YEAST WET PREP: NONE SEEN

## 2016-10-13 LAB — URINALYSIS, MICROSCOPIC (REFLEX): RBC / HPF: NONE SEEN RBC/hpf (ref 0–5)

## 2016-10-13 LAB — POCT PREGNANCY, URINE: Preg Test, Ur: NEGATIVE

## 2016-10-13 MED ORDER — IBUPROFEN 600 MG PO TABS: 600.0000 mg | ORAL_TABLET | Freq: Four times a day (QID) | ORAL | 1 refills | Status: DC | PRN

## 2016-10-13 MED ORDER — KETOROLAC TROMETHAMINE 30 MG/ML IJ SOLN
30.0000 mg | Freq: Once | INTRAMUSCULAR | Status: AC
  Administered 2016-10-13: 30 mg via INTRAVENOUS
  Filled 2016-10-13: qty 1

## 2016-10-13 NOTE — MAU Note (Signed)
Onset of left lower quadrant pain x 4 days, had sharp pain last night, had pink discharge last night, has not had a period since November.

## 2016-10-13 NOTE — MAU Provider Note (Signed)
Chief Complaint: Abdominal Pain and Vaginal Bleeding   First Provider Initiated Contact with Patient 10/13/16 1020      SUBJECTIVE HPI: Jasmine Castaneda is a 36 y.o. G1P0101 who presents to maternity admissions reporting onset of LLQ pain 4 days ago and vaginal spotting starting last night. Her LMP was in November. She does have irregular menses with hx PCOS but does not usually have pain like this or spotting when her menses start. She had negative home pregnancy test 3 weeks to 1 month ago when she missed her period.  She has tried rest, heat, and ibuprofen for her pain but the pain continues to gradually increase.  It has no associated symptoms other than the spotting. She has chronic constipation which is unchanged and she did have a bowel movement today. Her spotting is light, pink, intermittent when wiping only, and started last night. It is the same now as when it started.  She has not tried any treatments for bleeding.  She denies vaginal itching/burning, urinary symptoms, h/a, dizziness, n/v, or fever/chills.     HPI  Past Medical History:  Diagnosis Date  . Abnormal Pap smear    leep  . Infertility   . PCOS (polycystic ovarian syndrome)    Past Surgical History:  Procedure Laterality Date  . LEEP     Social History   Social History  . Marital status: Married    Spouse name: N/A  . Number of children: 1  . Years of education: N/A   Occupational History  . Stylist    Social History Main Topics  . Smoking status: Never Smoker  . Smokeless tobacco: Never Used  . Alcohol use No  . Drug use: No  . Sexual activity: Not on file   Other Topics Concern  . Not on file   Social History Narrative  . No narrative on file   No current facility-administered medications on file prior to encounter.    Current Outpatient Prescriptions on File Prior to Encounter  Medication Sig Dispense Refill  . diphenhydrAMINE (BENADRYL) 25 mg capsule Take 25 mg by mouth every 6 (six)  hours as needed for allergies.    Marland Kitchen letrozole (FEMARA) 2.5 MG tablet Take 1 tablet (2.5 mg total) by mouth daily. Take on days 3 to 7 following a spontaneous menses or progestin-induced bleed. 5 tablet 2  . linaclotide (LINZESS) 290 MCG CAPS capsule Take 1 capsule (290 mcg total) by mouth daily before breakfast. (Patient not taking: Reported on 04/04/2016) 30 capsule 11  . metFORMIN (GLUCOPHAGE) 850 MG tablet Take 1 tablet (850 mg total) by mouth 2 (two) times daily with a meal. (Patient not taking: Reported on 10/13/2016) 60 tablet 2   Allergies  Allergen Reactions  . Septra [Bactrim] Anaphylaxis  . Flagyl [Metronidazole] Nausea And Vomiting and Rash    Severe yeast infection    ROS:  Review of Systems  Constitutional: Negative for chills, fatigue and fever.  Respiratory: Negative for shortness of breath.   Cardiovascular: Negative for chest pain.  Gastrointestinal: Positive for abdominal pain and constipation. Negative for diarrhea, nausea and vomiting.  Genitourinary: Positive for pelvic pain and vaginal bleeding. Negative for difficulty urinating, dysuria, flank pain, vaginal discharge and vaginal pain.  Neurological: Negative for dizziness and headaches.  Psychiatric/Behavioral: Negative.      I have reviewed patient's Past Medical Hx, Surgical Hx, Family Hx, Social Hx, medications and allergies.   Physical Exam   Patient Vitals for the past 24 hrs:  BP  Temp Temp src Pulse Resp Height Weight  10/13/16 1417 145/96 - - 70 16 - -  10/13/16 0936 138/93 - - - - - -  10/13/16 0934 131/97 98 F (36.7 C) Oral 80 16 - -  10/13/16 0927 - - - - - 5\' 8"  (1.727 m) 170 lb 1.3 oz (77.1 kg)   Constitutional: Well-developed, well-nourished female in no acute distress.  Cardiovascular: normal rate Respiratory: normal effort GI: Abd soft, non-tender. Pos BS x 4 MS: Extremities nontender, no edema, normal ROM Neurologic: Alert and oriented x 4.  GU: Neg CVAT.  PELVIC EXAM: Cervix pink,  visually closed, without lesion, small amount dark red/brown bleeding, vaginal walls and external genitalia normal Bimanual exam: Cervix 0/long/high, firm, anterior, neg CMT, uterus nontender, nonenlarged, left adnexal tenderness, no palpable mass or enlargement     LAB RESULTS Results for orders placed or performed during the hospital encounter of 10/13/16 (from the past 24 hour(s))  Urinalysis, Routine w reflex microscopic     Status: Abnormal   Collection Time: 10/13/16  9:25 AM  Result Value Ref Range   Color, Urine YELLOW YELLOW   APPearance CLEAR CLEAR   Specific Gravity, Urine 1.015 1.005 - 1.030   pH 7.5 5.0 - 8.0   Glucose, UA NEGATIVE NEGATIVE mg/dL   Hgb urine dipstick LARGE (A) NEGATIVE   Bilirubin Urine NEGATIVE NEGATIVE   Ketones, ur NEGATIVE NEGATIVE mg/dL   Protein, ur NEGATIVE NEGATIVE mg/dL   Nitrite NEGATIVE NEGATIVE   Leukocytes, UA TRACE (A) NEGATIVE  Urinalysis, Microscopic (reflex)     Status: Abnormal   Collection Time: 10/13/16  9:25 AM  Result Value Ref Range   RBC / HPF NONE SEEN 0 - 5 RBC/hpf   WBC, UA 0-5 0 - 5 WBC/hpf   Bacteria, UA RARE (A) NONE SEEN   Squamous Epithelial / LPF 0-5 (A) NONE SEEN  Pregnancy, urine POC     Status: None   Collection Time: 10/13/16  9:31 AM  Result Value Ref Range   Preg Test, Ur NEGATIVE NEGATIVE  Wet prep, genital     Status: Abnormal   Collection Time: 10/13/16 10:40 AM  Result Value Ref Range   Yeast Wet Prep HPF POC NONE SEEN NONE SEEN   Trich, Wet Prep NONE SEEN NONE SEEN   Clue Cells Wet Prep HPF POC NONE SEEN NONE SEEN   WBC, Wet Prep HPF POC MODERATE (A) NONE SEEN   Sperm NONE SEEN   CBC     Status: Abnormal   Collection Time: 10/13/16 11:11 AM  Result Value Ref Range   WBC 3.6 (L) 4.0 - 10.5 K/uL   RBC 4.23 3.87 - 5.11 MIL/uL   Hemoglobin 12.9 12.0 - 15.0 g/dL   HCT 16.137.5 09.636.0 - 04.546.0 %   MCV 88.7 78.0 - 100.0 fL   MCH 30.5 26.0 - 34.0 pg   MCHC 34.4 30.0 - 36.0 g/dL   RDW 40.912.3 81.111.5 - 91.415.5 %    Platelets 267 150 - 400 K/uL       IMAGING Koreas Transvaginal Non-ob  Result Date: 10/13/2016 CLINICAL DATA:  Acute pelvic pain. EXAM: TRANSABDOMINAL AND TRANSVAGINAL ULTRASOUND OF PELVIS DOPPLER ULTRASOUND OF OVARIES TECHNIQUE: Both transabdominal and transvaginal ultrasound examinations of the pelvis were performed. Transabdominal technique was performed for global imaging of the pelvis including uterus, ovaries, adnexal regions, and pelvic cul-de-sac. It was necessary to proceed with endovaginal exam following the transabdominal exam to visualize the ovaries. Color and duplex Doppler ultrasound  was utilized to evaluate blood flow to the ovaries. COMPARISON:  None. FINDINGS: Uterus Measurements: 7.4 x 3.7 x 4.4 cm. No fibroids or other mass visualized. Endometrium Thickness: 9.3 mm. Indeterminate echogenic structure within the lower uterine segment measures 1.5 x 1.0 x 0.5 cm. Right ovary Measurements: 5.0 x 3.1 x 3.4 cm. Tubular structure within the right adnexa is identified which may reflect hydrosalpinx. Left ovary Measurements: 4.1 x 2.6 x 3.1 cm. Normal appearance/no adnexal mass. Pulsed Doppler evaluation of both ovaries demonstrates normal low-resistance arterial and venous waveforms. Other findings No abnormal free fluid. IMPRESSION: 1. No evidence for ovarian torsion or mass. 2. A focal endometrial lesion issuspected. Consider sonohysterogram forfurther evaluation, prior to hysteroscopy or endometrial biopsy. 3. Suspect right sided hydrosalpinx. Electronically Signed   By: Signa Kell M.D.   On: 10/13/2016 12:33   US Pelvis Complete  Result Date: 10/13/2016 CLINICAL DATA:  Acute pelvic pain. EXAM: TRANSABDOMINAL AND TRANSVAGINAL ULTRASOUND OF PELVIS DOPPLER ULTRASOUND OF OVARIES TECHNIQUE: Both transabdominal and transvaginal ultrasound examinations of the pelvis were performed. Transabdominal technique was performed for global imaging of the pelvis including uterus, ovaries, adnexal regions,  and pelvic cul-de-sac. It was necessary to proceed with endovaginal exam following the transabdominal exam to visualize the ovaries. Color and duplex Doppler ultrasound was utilized to evaluate blood flow to the ovaries. COMPARISON:  None. FINDINGS: Uterus Measurements: 7.4 x 3.7 x 4.4 cm. No fibroids or other mass visualized. Endometrium Thickness: 9.3 mm. Indeterminate echogenic structure within the lower uterine segment measures 1.5 x 1.0 x 0.5 cm. Right ovary Measurements: 5.0 x 3.1 x 3.4 cm. Tubular structure within the right adnexa is identified which may reflect hydrosalpinx. Left ovary Measurements: 4.1 x 2.6 x 3.1 cm. Normal appearance/no adnexal mass. Pulsed Doppler evaluation of both ovaries demonstrates normal low-resistance arterial and venous waveforms. Other findings No abnormal free fluid. IMPRESSION: 1. No evidence for ovarian torsion or mass. 2. A focal endometrial lesion issuspected. Consider sonohysterogram forfurther evaluation, prior to hysteroscopy or endometrial biopsy. 3. Suspect right sided hydrosalpinx. Electronically Signed   By: Signa Kell M.D.   On: 10/13/2016 12:33   [place] Art/ven Flow Abd Pelv Doppler  Result Date: 10/13/2016 CLINICAL DATA:  Acute pelvic pain. EXAM: TRANSABDOMINAL AND TRANSVAGINAL ULTRASOUND OF PELVIS DOPPLER ULTRASOUND OF OVARIES TECHNIQUE: Both transabdominal and transvaginal ultrasound examinations of the pelvis were performed. Transabdominal technique was performed for global imaging of the pelvis including uterus, ovaries, adnexal regions, and pelvic cul-de-sac. It was necessary to proceed with endovaginal exam following the transabdominal exam to visualize the ovaries. Color and duplex Doppler ultrasound was utilized to evaluate blood flow to the ovaries. COMPARISON:  None. FINDINGS: Uterus Measurements: 7.4 x 3.7 x 4.4 cm. No fibroids or other mass visualized. Endometrium Thickness: 9.3 mm. Indeterminate echogenic structure within the lower uterine segment  measures 1.5 x 1.0 x 0.5 cm. Right ovary Measurements: 5.0 x 3.1 x 3.4 cm. Tubular structure within the right adnexa is identified which may reflect hydrosalpinx. Left ovary Measurements: 4.1 x 2.6 x 3.1 cm. Normal appearance/no adnexal mass. Pulsed Doppler evaluation of both ovaries demonstrates normal low-resistance arterial and venous waveforms. Other findings No abnormal free fluid. IMPRESSION: 1. No evidence for ovarian torsion or mass. 2. A focal endometrial lesion issuspected. Consider sonohysterogram forfurther evaluation, prior to hysteroscopy or endometrial biopsy. 3. Suspect right sided hydrosalpinx. Electronically Signed   By: Signa Kell M.D.   On: 10/13/2016 12:33    MAU Management/MDM: Ordered labs and [place] and reviewed results.  Toradol 60 mg IM given with pt report of improved pain.  Abnormal [place] with nonspecific findings of endometrial mass and right hydrosalpinx.  No evidence of torsion.  Consult Dr Vergie Living.  Follow up outpatient in [facility].  Outpatient follow up US ordered, message sent to set up appointment in office.  Rx for ibuprofen 600 mg PO Q 6 hours PRN.   Infection and pelvic pain precautions reviewed.  Pt stable at time of discharge.  ASSESSMENT 1. Pelvic pain in female   2. Hydrosalpinx   3. Endometrial mass   4. PCOS (polycystic ovarian syndrome)     PLAN Discharge home Allergies as of 10/13/2016      Reactions   Septra [bactrim] Anaphylaxis   Flagyl [metronidazole] Nausea And Vomiting, Rash   Severe yeast infection      Medication List    STOP taking these medications   medroxyPROGESTERone 10 MG tablet Commonly known as:  PROVERA     TAKE these medications   BC HEADACHE POWDER PO Take 2 packets by mouth daily as needed (for pain/headaches).   diphenhydrAMINE 25 mg capsule Commonly known as:  BENADRYL Take 25 mg by mouth every 6 (six) hours as needed for allergies.   ibuprofen 600 MG tablet Commonly known as:  ADVIL,MOTRIN Take 1 tablet (600 mg  total) by mouth every 6 (six) hours as needed.   letrozole 2.5 MG tablet Commonly known as:  FEMARA Take 1 tablet (2.5 mg total) by mouth daily. Take on days 3 to 7 following a spontaneous menses or progestin-induced bleed.   linaclotide 290 MCG Caps capsule Commonly known as:  LINZESS Take 1 capsule (290 mcg total) by mouth daily before breakfast.   metFORMIN 850 MG tablet Commonly known as:  GLUCOPHAGE Take 1 tablet (850 mg total) by mouth 2 (two) times daily with a meal.   multivitamin with minerals Tabs tablet Take 1 tablet by mouth daily.   PROBIOTIC PO Take 1 capsule by mouth daily.      Follow-up Information    [facility] Follow up.   Specialty:  Obstetrics and Gynecology Why:  The office will call you with a follow up visit.  Return to MAU as needed for emergencies. Contact information: [address] [place] [address] [phone]          Sharen Counter Certified Nurse-Midwife 10/13/2016  2:33 PM

## 2016-10-16 LAB — GC/CHLAMYDIA PROBE AMP (~~LOC~~) NOT AT ARMC
CHLAMYDIA, DNA PROBE: NEGATIVE
Neisseria Gonorrhea: NEGATIVE

## 2016-10-17 ENCOUNTER — Other Ambulatory Visit: Payer: Self-pay | Admitting: Obstetrics & Gynecology

## 2016-10-19 ENCOUNTER — Ambulatory Visit (INDEPENDENT_AMBULATORY_CARE_PROVIDER_SITE_OTHER): Payer: Medicaid Other | Admitting: Obstetrics & Gynecology

## 2016-10-19 ENCOUNTER — Encounter: Payer: Self-pay | Admitting: Obstetrics & Gynecology

## 2016-10-19 VITALS — BP 136/103 | HR 77 | Wt 171.3 lb

## 2016-10-19 DIAGNOSIS — E282 Polycystic ovarian syndrome: Secondary | ICD-10-CM | POA: Diagnosis not present

## 2016-10-19 DIAGNOSIS — N970 Female infertility associated with anovulation: Secondary | ICD-10-CM

## 2016-10-19 MED ORDER — FLUCONAZOLE 150 MG PO TABS: ORAL_TABLET | ORAL | 0 refills | Status: DC

## 2016-10-19 NOTE — Progress Notes (Signed)
Patient ID: Jasmine Castaneda, female   DOB: 1981/07/17, 36 y.o.   MRN: [id number]  No chief complaint on file. f/u from MAU visit  HPI Jasmine Heapngel L Rodin is a 36 y.o. female.  G1P0101 Patient's last menstrual period was 10/13/2016. She has dx of PCOS and infertility and was treated with provera and Femara. She has pelvic pain and Koreas suggested possible right hydosalpinx.. Needs rx for vaginal yeast  HPI  Past Medical History:  Diagnosis Date  . Abnormal Pap smear    leep  . Infertility   . PCOS (polycystic ovarian syndrome)     Past Surgical History:  Procedure Laterality Date  . LEEP      Family History  Problem Relation Age of Onset  . Hypertension Father   . Ovarian cancer Mother   . Ulcerative colitis Mother   . Cystic fibrosis Mother   . Kidney disease Maternal Uncle   . Irritable bowel syndrome Paternal Aunt   . Diabetes Maternal Grandmother   . Diabetes Maternal Grandfather   . Diabetes Paternal Grandmother   . Diabetes Paternal Grandfather     Social History Social History  Substance Use Topics  . Smoking status: Never Smoker  . Smokeless tobacco: Never Used  . Alcohol use No    Allergies  Allergen Reactions  . Septra [Bactrim] Anaphylaxis  . Flagyl [Metronidazole] Nausea And Vomiting and Rash    Severe yeast infection    Current Outpatient Prescriptions  Medication Sig Dispense Refill  . Aspirin-Salicylamide-Caffeine (BC HEADACHE POWDER PO) Take 2 packets by mouth daily as needed (for pain/headaches).    . diphenhydrAMINE (BENADRYL) 25 mg capsule Take 25 mg by mouth every 6 (six) hours as needed for allergies.    . fluconazole (DIFLUCAN) 150 MG tablet TAKE 1 TABLET BY MOUTH ONCE AS 1 DOSE. CAN TAKE ADDITIONAL DOSE 3 DAYS LATER IF SYMPTOMS PERSIST 1 tablet 0  . ibuprofen (ADVIL,MOTRIN) 600 MG tablet Take 1 tablet (600 mg total) by mouth every 6 (six) hours as needed. 30 tablet 1  . metFORMIN (GLUCOPHAGE) 850 MG tablet Take 1 tablet (850 mg  total) by mouth 2 (two) times daily with a meal. 60 tablet 2  . Multiple Vitamin (MULTIVITAMIN WITH MINERALS) TABS tablet Take 1 tablet by mouth daily.    . Probiotic Product (PROBIOTIC PO) Take 1 capsule by mouth daily.    Marland Kitchen. letrozole (FEMARA) 2.5 MG tablet Take 1 tablet (2.5 mg total) by mouth daily. Take on days 3 to 7 following a spontaneous menses or progestin-induced bleed. (Patient not taking: Reported on 10/19/2016) 5 tablet 2  . linaclotide (LINZESS) 290 MCG CAPS capsule Take 1 capsule (290 mcg total) by mouth daily before breakfast. (Patient not taking: Reported on 04/04/2016) 30 capsule 11   No current facility-administered medications for this visit.     Review of Systems Review of Systems  Gastrointestinal: Negative.   Genitourinary: Positive for pelvic pain and vaginal discharge. Negative for menstrual problem and vaginal bleeding.    Blood pressure (!) 136/103, pulse 77, weight 171 lb 4.8 oz (77.7 kg), last menstrual period 10/13/2016.  Physical Exam Physical Exam  Constitutional: She appears well-developed.  Neurological: She is alert.  Psychiatric: She has a normal mood and affect. Her behavior is normal.    Data Reviewed CLINICAL DATA:  Acute pelvic pain.  EXAM: TRANSABDOMINAL AND TRANSVAGINAL ULTRASOUND OF PELVIS  DOPPLER ULTRASOUND OF OVARIES  TECHNIQUE: Both transabdominal and transvaginal ultrasound examinations of the pelvis were performed. Transabdominal technique was  performed for global imaging of the pelvis including uterus, ovaries, adnexal regions, and pelvic cul-de-sac.  It was necessary to proceed with endovaginal exam following the transabdominal exam to visualize the ovaries. Color and duplex Doppler ultrasound was utilized to evaluate blood flow to the ovaries.  COMPARISON:  None.  FINDINGS: Uterus  Measurements: 7.4 x 3.7 x 4.4 cm. No fibroids or other mass visualized.  Endometrium  Thickness: 9.3 mm. Indeterminate  echogenic structure within the lower uterine segment measures 1.5 x 1.0 x 0.5 cm.  Right ovary  Measurements: 5.0 x 3.1 x 3.4 cm. Tubular structure within the right adnexa is identified which may reflect hydrosalpinx.  Left ovary  Measurements: 4.1 x 2.6 x 3.1 cm. Normal appearance/no adnexal mass.  Pulsed Doppler evaluation of both ovaries demonstrates normal low-resistance arterial and venous waveforms.  Other findings  No abnormal free fluid.  IMPRESSION: 1. No evidence for ovarian torsion or mass. 2. A focal endometrial lesion issuspected. Consider sonohysterogram forfurther evaluation, prior to hysteroscopy or endometrial biopsy. 3. Suspect right sided hydrosalpinx.   Electronically Signed   By: Signa Kell M.D.   On: 10/13/2016 12:33   Assessment    Patient Active Problem List   Diagnosis Date Noted  . Chronic pelvic pain in female 07/07/2013  . Recurrent vaginitis 05/01/2013  . Abdominal pain 04/19/2011  . PCOS (polycystic ovarian syndrome) 04/19/2011  . Anovulation 04/19/2011  . Amenorrhea 04/19/2011   Possible hydrosalpinx  Infertility  Plan    Infertility referral to Dr April Manson, may need HSG, female evaluation       Scheryl Darter 10/19/2016, 10:26 AM

## 2016-11-15 ENCOUNTER — Ambulatory Visit (INDEPENDENT_AMBULATORY_CARE_PROVIDER_SITE_OTHER): Payer: Medicaid Other | Admitting: Advanced Practice Midwife

## 2016-11-15 VITALS — BP 124/86 | HR 73 | Ht 68.0 in | Wt 169.6 lb

## 2016-11-15 DIAGNOSIS — R102 Pelvic and perineal pain: Secondary | ICD-10-CM

## 2016-11-15 DIAGNOSIS — G8929 Other chronic pain: Secondary | ICD-10-CM

## 2016-11-15 NOTE — Progress Notes (Signed)
Subjective:     Jasmine Castaneda is a 36 y.o. female here for a follow up gyn visit from her MAU visit for pelvic pain.  She reports intermittent pelvic pain/cramping improved by ibuprofen but not resolved.  She had US in MAU on 10/13/16 indicating focal lesion in endometrium and tubular structure in right adnexa indicating possible hydrosalpinx.  With no fever, WBCs, or pain at time of MAU visit, no treatment provided but plan to follow up.   Current complaints: intermittent pelvic pain on right and left sides .     Gynecologic History No LMP recorded. Contraception: none Last Pap: 2015. Results were: normal. Previous LEEP for abnormal Pap prior to 2015 results.   Obstetric History OB History  Gravida Para Term Preterm AB Living  1 1   1  0 1  SAB TAB Ectopic Multiple Live Births  0 0 0 0 1    # Outcome Date GA Lbr Len/2nd Weight Sex Delivery Anes PTL Lv  1 Preterm 11/2004 [id number]   F Vag-Spont EPI       Birth Comments: Induced for pain/edema       The following portions of the patient's history were reviewed and updated as appropriate: allergies, current medications, past family history, past medical history, past social history, past surgical history and problem list.  Review of Systems Pertinent items are noted in HPI.    Objective:     BP 124/86   Pulse 73   Ht 5\' 8"  (1.727 m)   Wt 169 lb 9.6 oz (76.9 kg)   BMI 25.79 kg/m  GENERAL: Well-developed, well-nourished female in no acute distress.  HEENT: Normocephalic, atraumatic. Sclerae anicteric.  NECK: Supple. Normal thyroid.  LUNGS: Clear to auscultation bilaterally.  HEART: Regular rate and rhythm. BREASTS: Deferred ABDOMEN: Soft, nontender, nondistended. No organomegaly. PELVIC: Deferred EXTREMITIES: No cyanosis, clubbing, or edema, 2+ distal pulses.    Assessment:     1. Chronic pelvic pain in female --No evidence of acute abdomen or other emergencies today. Pain is chronic. Pt denies pain today as pain is  intermittent.  Will order outpatient US to compare to previous images and reschedule follow up appointment. - US Transvaginal Non-OB; Future Plan:    Follow up in: 2 months.

## 2016-11-15 NOTE — Patient Instructions (Signed)
Pelvic Pain, Female Pelvic pain is pain in your lower abdomen, below your belly button and between your hips. The pain may start suddenly (acute), keep coming back (recurring), or last a long time (chronic). Pelvic pain that lasts longer than six months is considered chronic. Pelvic pain may affect your:  Reproductive organs.  Urinary system.  Digestive tract.  Musculoskeletal system. There are many potential causes of pelvic pain. Sometimes, the pain can be a result of digestive or urinary conditions, strained muscles or ligaments, or even reproductive conditions. Sometimes the cause of pelvic pain is not known. Follow these instructions at home:  Take over-the-counter and prescription medicines only as told by your health care provider.  Rest as told by your health care provider.  Do not have sex it if hurts.  Keep a journal of your pelvic pain. Write down:  When the pain started.  Where the pain is located.  What seems to make the pain better or worse, such as food or your menstrual cycle.  Any symptoms you have along with the pain.  Keep all follow-up visits as told by your health care provider. This is important. Contact a health care provider if:  Medicine does not help your pain.  Your pain comes back.  You have new symptoms.  You have abnormal vaginal discharge or bleeding, including bleeding after menopause.  You have a fever or chills.  You are constipated.  You have blood in your urine or stool.  You have foul-smelling urine.  You feel weak or lightheaded. Get help right away if:  You have sudden severe pain.  Your pain gets steadily worse.  You have severe pain along with fever, nausea, vomiting, or excessive sweating.  You lose consciousness. This information is not intended to replace advice given to you by your health care provider. Make sure you discuss any questions you have with your health care provider. Document Released: 07/25/2004  Document Revised: 09/22/2015 Document Reviewed: 06/18/2015 Elsevier Interactive Patient Education  2017 [employer]  

## 2016-11-25 ENCOUNTER — Other Ambulatory Visit: Payer: Self-pay | Admitting: Obstetrics & Gynecology

## 2016-12-06 ENCOUNTER — Ambulatory Visit (HOSPITAL_COMMUNITY)
Admission: RE | Admit: 2016-12-06 | Discharge: 2016-12-06 | Disposition: A | Discharge status: Home / Self Care | Payer: Medicaid Other | Source: Ambulatory Visit | Attending: Advanced Practice Midwife | Admitting: Advanced Practice Midwife

## 2016-12-06 ENCOUNTER — Encounter: Payer: Self-pay | Admitting: Advanced Practice Midwife

## 2016-12-06 ENCOUNTER — Ambulatory Visit (INDEPENDENT_AMBULATORY_CARE_PROVIDER_SITE_OTHER): Payer: Medicaid Other | Admitting: Advanced Practice Midwife

## 2016-12-06 VITALS — BP 126/90 | HR 73 | Wt 170.0 lb

## 2016-12-06 DIAGNOSIS — N926 Irregular menstruation, unspecified: Secondary | ICD-10-CM | POA: Insufficient documentation

## 2016-12-06 DIAGNOSIS — E282 Polycystic ovarian syndrome: Secondary | ICD-10-CM | POA: Diagnosis not present

## 2016-12-06 DIAGNOSIS — G8929 Other chronic pain: Secondary | ICD-10-CM | POA: Insufficient documentation

## 2016-12-06 DIAGNOSIS — R102 Pelvic and perineal pain: Secondary | ICD-10-CM | POA: Insufficient documentation

## 2016-12-06 MED ORDER — METFORMIN HCL 500 MG PO TABS: 500.0000 mg | ORAL_TABLET | Freq: Two times a day (BID) | ORAL | 11 refills | Status: DC

## 2016-12-06 MED ORDER — MEDROXYPROGESTERONE ACETATE 10 MG PO TABS: ORAL_TABLET | ORAL | 3 refills | Status: DC

## 2016-12-06 NOTE — Progress Notes (Signed)
Subjective:     Patient ID: Jasmine Castaneda, female   DOB: [birth year], 36 y.o.   MRN: [id number]  HPI [id number] presents to office for follow up visit related to chronic pelvic pain and abnormal previous US.  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  Pt had no s/sx of infection so follow up US was ordered and no immediate treatment of hydrosalpinx was needed.  Pt had [place] today, prior to office visit. She reports her intermittent pelvic pain continues, is unchanged, and denies pain currently during today's visit. She takes ibuprofen which helps but does not resolve her pain.  She reports her last period was almost 1 year ago.  She does have changes in her skin and has noticed increased hair on her face in recent months.  She denies vaginal bleeding, vaginal itching/burning, urinary symptoms, h/a, dizziness, n/v, or fever/chills.     Review of Systems  Constitutional: Negative for chills, fatigue and fever.  Respiratory: Negative for shortness of breath.   Cardiovascular: Negative for chest pain.  Gastrointestinal: Negative for nausea and vomiting.  Genitourinary: Negative for difficulty urinating, dysuria, flank pain, pelvic pain, vaginal bleeding, vaginal discharge and vaginal pain.  Neurological: Negative for dizziness and headaches.  Psychiatric/Behavioral: Negative.        Objective:   Physical Exam VS reviewed, nursing note reviewed,  Constitutional: well developed, well nourished, no distress HEENT: normocephalic CV: normal rate Pulm/chest wall: normal effort Abdomen: soft Neuro: alert and oriented x 3 Skin: warm, dry Psych: affect normal  Pelvic exam deferred    Pt meets   US Transvaginal Non-ob  Result Date: 12/06/2016 CLINICAL DATA:  Irregular periods.  Chronic pelvic pain. EXAM: ULTRASOUND PELVIS TRANSVAGINAL TECHNIQUE: Transvaginal ultrasound examination of the pelvis was performed including evaluation of the uterus,  ovaries, adnexal regions, and pelvic cul-de-sac. COMPARISON:  10/13/2016 FINDINGS: Uterus Measurements: 7.9 x 3.9 x 4.8 cm. No fibroids or other mass visualized. Endometrium Thickness: 8 mm in thickness. Previously seen echogenic area in the endometrium within the lower uterine segment is not visualized on today's study. Right ovary Measurements: 4.5 x 2.1 x 3.1 cm. Normal appearance/no adnexal mass. Left ovary Measurements: 3.5 x 2.1 x 3.2 cm. Normal appearance/no adnexal mass. Other findings:  No abnormal free fluid IMPRESSION: Unremarkable study. Previously seen echogenic area within the endometrium in the lower uterus segment not visualized on today's study. Electronically Signed   By: Rolm Baptise M.D.   On: 12/06/2016 08:58       MDM; Pt likely has PCOS, as [place] criteria are met (see below) which may be contributing to her intermittent pelvic pain, although no large cysts or evidence of pelvic abnormality are noted on [place].  Will treat PCOS and f/u in 3 months to see if pain is improved.  Consult Dr Kennon Rounds.  Provera x 10 days to start menses and start Metformin 500 mg BID.  If no menses x 2-3 months, take 10 days of Provera again. TSH and A1C today.  F/U scheduled in office in 3 months.  [place] criteria for PCOS are met with oligo/anuvulation, mild hyperandrogenism with changes in skin/hair, and [place] today, while normal does indicate polycystic ovaries  Assessment:     1. PCOS (polycystic ovarian syndrome) - medroxyPROGESTERone (PROVERA) 10 MG tablet; Take daily for 10 days every month or two as needed.  Dispense: 30 tablet; Refill: 3 - metFORMIN (GLUCOPHAGE) 500 MG tablet; Take 1 tablet (500 mg total) by mouth 2 (two)  times daily with a meal.  Dispense: 60 tablet; Refill: 11 - TSH - Hemoglobin A1c    Plan:     See above

## 2016-12-06 NOTE — Patient Instructions (Signed)
Diet for Polycystic Ovarian Syndrome Polycystic ovary syndrome (PCOS) is a disorder of the chemical messengers (hormones) that regulate menstruation. The condition causes important hormones to be out of balance. PCOS can:  Make your periods irregular or stop.  Cause cysts to develop on the ovaries.  Make it difficult to get pregnant.  Stop your body from responding to the effects of insulin (insulin resistance), which can lead to obesity and diabetes. Changing what you eat can help manage PCOS and improve your health. It can help you lose weight and improve the way your body uses insulin. What is my plan?  Eat breakfast, lunch, and dinner plus two snacks every day.  Include protein in each meal and snack.  Choose whole grains instead of products made with refined flour.  Eat a variety of foods.  Exercise regularly as told by your health care provider. What do I need to know about this eating plan? If you are overweight or obese, pay attention to how many calories you eat. Cutting down on calories can help you lose weight. Work with your health care provider or dietitian to figure out how many calories you need each day. What foods can I eat? Grains  Whole grains, such as whole wheat. Whole-grain breads, crackers, cereals, and pasta. Unsweetened oatmeal, bulgur, barley, quinoa, or brown rice. Corn or whole-wheat flour tortillas. Vegetables   Lettuce. Spinach. Peas. Beets. Cauliflower. Cabbage. Broccoli. Carrots. Tomatoes. Squash. Eggplant. Herbs. Peppers. Onions. Cucumbers. Brussels sprouts. Fruits  Berries. Bananas. Apples. Oranges. Grapes. Papaya. Mango. Pomegranate. Kiwi. Grapefruit. Cherries. Meats and Other Protein Sources  Lean proteins, such as fish, chicken, beans, eggs, and tofu. Dairy  Low-fat dairy products, such as skim milk, cheese sticks, and yogurt. Beverages  Low-fat or fat-free drinks, such as water, low-fat milk, sugar-free drinks, and 100% fruit  juice. Condiments  Ketchup. Mustard. Barbecue sauce. Relish. Low-fat or fat-free mayonnaise. Fats and Oils  Olive oil or canola oil. Walnuts and almonds. The items listed above may not be a complete list of recommended foods or beverages. Contact your dietitian for more options.  What foods are not recommended? Foods high in calories or fat. Fried foods. Sweets. Products made from refined white flour, including white bread, pastries, white rice, and pasta. The items listed above may not be a complete list of foods and beverages to avoid. Contact your dietitian for more information.  This information is not intended to replace advice given to you by your health care provider. Make sure you discuss any questions you have with your health care provider. Document Released: 12/20/2015 Document Revised: 02/03/2016 Document Reviewed: 09/09/2014 Elsevier Interactive Patient Education  2017 Else[employer] Inc[employer]  Polycystic Ovarian Syndrome Polycystic ovarian syndrome (PCOS) is a common hormonal disorder among women of reproductive age. In most women with PCOS, many small fluid-filled sacs (cysts) grow on the ovaries, and the cysts are not part of a normal menstrual cycle. PCOS can cause problems with your menstrual periods and make it difficult to get pregnant. It can also cause an increased risk of miscarriage with pregnancy. If it is not treated, PCOS can lead to serious health problems, such as diabetes and heart disease. What are the causes? The cause of PCOS is not known, but it may be the result of a combination of certain factors, such as:  Irregular menstrual cycle.  High levels of certain hormones (androgens).  Problems with the hormone that helps to control blood sugar (insulin resistance).  Certain genes. What increases the risk? This condition  is more likely to develop in women who have a family history of PCOS. What are the signs or symptoms? Symptoms of PCOS may include:  Multiple  ovarian cysts.  Infrequent periods or no periods.  Periods that are too frequent or too heavy.  Unpredictable periods.  Inability to get pregnant (infertility) because of not ovulating.  Increased growth of hair on the face, chest, stomach, back, thumbs, thighs, or toes.  Acne or oily skin. Acne may develop during adulthood, and it may not respond to treatment.  Pelvic pain.  Weight gain or obesity.  Patches of thickened and dark brown or black skin on the neck, arms, breasts, or thighs (acanthosis nigricans).  Excess hair growth on the face, chest, abdomen, or upper thighs (hirsutism). How is this diagnosed? This condition is diagnosed based on:  Your medical history.  A physical exam, including a pelvic exam. Your health care provider may look for areas of increased hair growth on your skin.  Tests, such as:  Ultrasound. This may be used to examine the ovaries and the lining of the uterus (endometrium) for cysts.  Blood tests. These may be used to check levels of sugar (glucose), female hormone (testosterone), and female hormones (estrogen and progesterone) in your blood. How is this treated? There is no cure for PCOS, but treatment can help to manage symptoms and prevent more health problems from developing. Treatment varies depending on:  Your symptoms.  Whether you want to have a baby or whether you need birth control (contraception). Treatment may include nutrition and lifestyle changes along with:  Progesterone hormone to start a menstrual period.  Birth control pills to help you have regular menstrual periods.  Medicines to make you ovulate, if you want to get pregnant.  Medicine to reduce excessive hair growth.  Surgery, in severe cases. This may involve making small holes in one or both of your ovaries. This decreases the amount of testosterone that your body produces. Follow these instructions at home:  Take over-the-counter and prescription medicines only  as told by your health care provider.  Follow a healthy meal plan. This can help you reduce the effects of PCOS.  Eat a healthy diet that includes lean proteins, complex carbohydrates, fresh fruits and vegetables, low-fat dairy products, and healthy fats. Make sure to eat enough fiber.  If you are overweight, lose weight as told by your health care provider.  Losing 10% of your body weight may improve symptoms.  Your health care provider can determine how much weight loss is best for you and can help you lose weight safely.  Keep all follow-up visits as told by your health care provider. This is important. Contact a health care provider if:  Your symptoms do not get better with medicine.  You develop new symptoms. This information is not intended to replace advice given to you by your health care provider. Make sure you discuss any questions you have with your health care provider. Document Released: 12/22/2004 Document Revised: 04/25/2016 Document Reviewed: 02/13/2016 Elsevier Interactive Patient Education  2017 [employer].

## 2016-12-07 LAB — HEMOGLOBIN A1C
ESTIMATED AVERAGE GLUCOSE: 94 mg/dL
HEMOGLOBIN A1C: 4.9 % (ref 4.8–5.6)

## 2016-12-07 LAB — TSH: TSH: 0.609 u[IU]/mL (ref 0.450–4.500)

## 2017-03-05 ENCOUNTER — Encounter: Payer: Self-pay | Admitting: Obstetrics and Gynecology

## 2017-03-05 ENCOUNTER — Ambulatory Visit (INDEPENDENT_AMBULATORY_CARE_PROVIDER_SITE_OTHER): Payer: Medicaid Other | Admitting: Obstetrics and Gynecology

## 2017-03-05 ENCOUNTER — Other Ambulatory Visit (HOSPITAL_COMMUNITY)
Admission: RE | Admit: 2017-03-05 | Discharge: 2017-03-05 | Disposition: A | Discharge status: Home / Self Care | Payer: Medicaid Other | Source: Ambulatory Visit | Attending: Obstetrics and Gynecology | Admitting: Obstetrics and Gynecology

## 2017-03-05 VITALS — BP 143/99 | HR 64 | Wt 166.7 lb

## 2017-03-05 DIAGNOSIS — B9689 Other specified bacterial agents as the cause of diseases classified elsewhere: Secondary | ICD-10-CM | POA: Insufficient documentation

## 2017-03-05 DIAGNOSIS — N898 Other specified noninflammatory disorders of vagina: Secondary | ICD-10-CM

## 2017-03-05 DIAGNOSIS — Z113 Encounter for screening for infections with a predominantly sexual mode of transmission: Secondary | ICD-10-CM

## 2017-03-05 NOTE — Progress Notes (Signed)
Pt presents with c/o vaginal discharge x 1 week with slight odor. Pt recently completed course of antibiotics for eye cellulitis. MD prescribed Diflucan for possible yeast but she still has discharge Same sex partner  Not taking Provera or Metformin for PCOS.  Had a cycle in May but no cycle in June thus far  PE AF VSS  Lungs clear Heart RRR GU Nl EGBUS white discharge wet prep obtained  A/P Vaginal discharge        PCOS  Will treat discharge per test results. Pt encouraged to take provera on monthly F/U PRN

## 2017-03-06 LAB — CERVICOVAGINAL ANCILLARY ONLY
Bacterial vaginitis: POSITIVE — AB
Candida vaginitis: NEGATIVE
Chlamydia: NEGATIVE
NEISSERIA GONORRHEA: NEGATIVE
TRICH (WINDOWPATH): NEGATIVE

## 2017-03-07 ENCOUNTER — Telehealth: Payer: Self-pay | Admitting: *Deleted

## 2017-03-07 DIAGNOSIS — B9689 Other specified bacterial agents as the cause of diseases classified elsewhere: Secondary | ICD-10-CM

## 2017-03-07 DIAGNOSIS — N760 Acute vaginitis: Principal | ICD-10-CM

## 2017-03-07 MED ORDER — CLINDAMYCIN HCL 300 MG PO CAPS: 300.0000 mg | ORAL_CAPSULE | Freq: Two times a day (BID) | ORAL | 0 refills | Status: AC

## 2017-03-07 NOTE — Telephone Encounter (Signed)
Called and notified Lawanna Kobusngel of dx BV and need for flagyl - discussed she is allergic to flagyl- discussed will order alternate tindamax ( on protocol)but then discovered this is contraindicated when allergic to flagyl.  Lawanna Kobusngel states she has used clindamycin before and that worked.Informed her I will discuss with provider and send in order- if any issues, will call her. Discussed with Judeth HornErin Lawrence, NP and order sent in to patients pharmacy.

## 2017-03-07 NOTE — Telephone Encounter (Signed)
-----   Message from Hermina StaggersMichael L Ervin, MD sent at 03/06/2017  9:03 PM EDT ----- Flagyl 500 mg po bid x 7 days for BV Thanks Casimiro NeedleMichael

## 2017-07-02 ENCOUNTER — Ambulatory Visit (INDEPENDENT_AMBULATORY_CARE_PROVIDER_SITE_OTHER): Payer: Medicaid Other | Admitting: *Deleted

## 2017-07-02 ENCOUNTER — Other Ambulatory Visit (HOSPITAL_COMMUNITY)
Admission: RE | Admit: 2017-07-02 | Discharge: 2017-07-02 | Disposition: A | Discharge status: Home / Self Care | Payer: Medicaid Other | Source: Ambulatory Visit | Attending: Obstetrics & Gynecology | Admitting: Obstetrics & Gynecology

## 2017-07-02 DIAGNOSIS — Z3202 Encounter for pregnancy test, result negative: Secondary | ICD-10-CM | POA: Diagnosis not present

## 2017-07-02 DIAGNOSIS — N912 Amenorrhea, unspecified: Secondary | ICD-10-CM

## 2017-07-02 DIAGNOSIS — T3695XA Adverse effect of unspecified systemic antibiotic, initial encounter: Secondary | ICD-10-CM

## 2017-07-02 DIAGNOSIS — Z113 Encounter for screening for infections with a predominantly sexual mode of transmission: Secondary | ICD-10-CM

## 2017-07-02 DIAGNOSIS — N760 Acute vaginitis: Secondary | ICD-10-CM

## 2017-07-02 DIAGNOSIS — N898 Other specified noninflammatory disorders of vagina: Secondary | ICD-10-CM | POA: Diagnosis present

## 2017-07-02 DIAGNOSIS — B9689 Other specified bacterial agents as the cause of diseases classified elsewhere: Secondary | ICD-10-CM

## 2017-07-02 DIAGNOSIS — B379 Candidiasis, unspecified: Secondary | ICD-10-CM

## 2017-07-02 LAB — POCT PREGNANCY, URINE: PREG TEST UR: NEGATIVE

## 2017-07-02 MED ORDER — FLUCONAZOLE 150 MG PO TABS: 150.0000 mg | ORAL_TABLET | Freq: Once | ORAL | 0 refills | Status: AC

## 2017-07-02 MED ORDER — CLINDAMYCIN PHOSPHATE 2 % VA CREA
1.0000 | TOPICAL_CREAM | Freq: Every day | VAGINAL | 0 refills | Status: DC
Start: 2017-07-02 — End: 2017-07-03

## 2017-07-02 NOTE — Progress Notes (Signed)
Patient presents to clinic for self swab d/t foul smelling vaginal discharge. Instructed patient in self swab, patient collected specimen. Pt expressed concern because her period is 37 days late. This has been an issue for her and she took provera to restart her cycle. UPT negative. Patient still has refills of provera, recommended she pick it up and use as instructed, if questions or problems she should call or make an appt with a provider. Understanding voiced. Patient requests clindamycin rather than flagyl for BV, also diflucan for yeast infection she is likely to get. Checked with Dr Marice Potterove, she okayed this treatment. Meds to pharmacy.

## 2017-07-03 LAB — CERVICOVAGINAL ANCILLARY ONLY
BACTERIAL VAGINITIS: POSITIVE — AB
Candida vaginitis: NEGATIVE
Chlamydia: NEGATIVE
Neisseria Gonorrhea: NEGATIVE
Trichomonas: NEGATIVE

## 2017-07-03 MED ORDER — CLINDAMYCIN HCL 300 MG PO CAPS: 300.0000 mg | ORAL_CAPSULE | Freq: Two times a day (BID) | ORAL | 0 refills | Status: AC

## 2017-07-03 NOTE — Addendum Note (Signed)
Addended by: Garret ReddishBARNES, Keari Miu M on: 07/03/2017 11:30 AM   Modules accepted: Orders

## 2017-08-13 ENCOUNTER — Telehealth: Payer: Self-pay | Admitting: *Deleted

## 2017-08-13 DIAGNOSIS — N760 Acute vaginitis: Principal | ICD-10-CM

## 2017-08-13 DIAGNOSIS — B9689 Other specified bacterial agents as the cause of diseases classified elsewhere: Secondary | ICD-10-CM

## 2017-08-13 DIAGNOSIS — B379 Candidiasis, unspecified: Secondary | ICD-10-CM

## 2017-08-13 NOTE — Telephone Encounter (Signed)
Received message left on nurse voicemail today.  Patient states she was previously treated for yeast and BV.  States she doesn't think the infection completely cleared.  Requests a refill on medications.  Would like a return call to [phone].

## 2017-08-15 MED ORDER — FLUCONAZOLE 150 MG PO TABS: 150.0000 mg | ORAL_TABLET | Freq: Once | ORAL | 0 refills | Status: AC

## 2017-08-15 MED ORDER — CLINDAMYCIN HCL 300 MG PO CAPS: 300.0000 mg | ORAL_CAPSULE | Freq: Two times a day (BID) | ORAL | 0 refills | Status: DC

## 2017-08-15 NOTE — Addendum Note (Signed)
Addended by: Kathee DeltonHILLMAN, Apoorva Bugay L on: 08/15/2017 10:08 AM   Modules accepted: Orders

## 2017-08-15 NOTE — Telephone Encounter (Addendum)
Patient called into front office requesting refill on clindamycin and diflucan. Patient states infection never really went away from treatment in October. Patient states she is having a lot of discharge and odor. Refills sent in per protocol. Discussed BV prevention as well with patient. Patient verbalized understanding & had no questions

## 2017-09-17 ENCOUNTER — Telehealth: Payer: Self-pay | Admitting: General Practice

## 2017-09-17 DIAGNOSIS — B379 Candidiasis, unspecified: Secondary | ICD-10-CM

## 2017-09-17 MED ORDER — FLUCONAZOLE 150 MG PO TABS: 150.0000 mg | ORAL_TABLET | Freq: Once | ORAL | 0 refills | Status: DC

## 2017-09-17 NOTE — Telephone Encounter (Signed)
Patient called into front office stating she just came off her cycle and as usual she has a terrible yeast infection. Patient states she has PCOS and everytime she gets a cycle which can be anywhere from 29-55 days she gets a horrible yeast infection. Patient states she has to take 2 doses 3 days apart for full treatment. Refill sent in to pharmacy for patient and encouraged her to continue probiotics & watch sugar consumption around menstrual cycle. Patient verbalized understanding to all & had no questions

## 2017-10-21 ENCOUNTER — Emergency Department (HOSPITAL_BASED_OUTPATIENT_CLINIC_OR_DEPARTMENT_OTHER)
Admission: EM | Admit: 2017-10-21 | Discharge: 2017-10-21 | Disposition: A | Discharge status: Home / Self Care | Payer: Medicaid Other | Attending: Emergency Medicine | Admitting: Emergency Medicine

## 2017-10-21 ENCOUNTER — Other Ambulatory Visit: Payer: Self-pay

## 2017-10-21 ENCOUNTER — Encounter (HOSPITAL_BASED_OUTPATIENT_CLINIC_OR_DEPARTMENT_OTHER): Payer: Self-pay | Admitting: *Deleted

## 2017-10-21 DIAGNOSIS — R0602 Shortness of breath: Secondary | ICD-10-CM | POA: Insufficient documentation

## 2017-10-21 DIAGNOSIS — Z5321 Procedure and treatment not carried out due to patient leaving prior to being seen by health care provider: Secondary | ICD-10-CM | POA: Diagnosis not present

## 2017-10-21 HISTORY — DX: Unspecified asthma, uncomplicated: J45.909

## 2017-10-21 NOTE — ED Notes (Signed)
Pt called for room. Not located in either lobby

## 2017-10-21 NOTE — ED Triage Notes (Addendum)
Pt states she had a coughing fit last night and her chest "got tight". Pt states she thought she was having an asthma attack so she used her rescue inhaler. States she used the inhaler again this am and now she "feels jittery". Eval by RT in triage

## 2017-10-21 NOTE — ED Triage Notes (Signed)
Pt states she had an asthma attack last night and is feeling SOB. Pt speaking full sentences. Eval by RT in triage

## 2017-10-21 NOTE — ED Notes (Signed)
Pt alert and active in lobby. NAD

## 2017-10-22 NOTE — ED Notes (Signed)
Follow-up call completed, 10/22/2017, 17:38

## 2017-10-24 ENCOUNTER — Ambulatory Visit: Payer: Medicaid Other | Attending: Urology | Admitting: Physical Therapy

## 2017-10-24 ENCOUNTER — Other Ambulatory Visit: Payer: Self-pay

## 2017-10-24 DIAGNOSIS — M6281 Muscle weakness (generalized): Secondary | ICD-10-CM | POA: Diagnosis not present

## 2017-10-24 DIAGNOSIS — R252 Cramp and spasm: Secondary | ICD-10-CM

## 2017-10-24 NOTE — Therapy (Signed)
[facility] [address], [address] [place], [place], [address] Phone: [phone]   Fax:  [phone]  Physical Therapy Evaluation  Patient Details  Name: Jasmine Castaneda MRN: [id number] Date of Birth: February 19, 1981 Referring Provider: Jerilee Field, MD   Encounter Date: 10/24/2017  PT End of Session - 10/24/17 1350    Visit Number  1    Date for PT Re-Evaluation  02/13/18    Authorization Type  medicaid - re-auth on 11/21/17    PT Start Time  0845    PT Stop Time  0930    PT Time Calculation (min)  45 min    Activity Tolerance  Patient tolerated treatment well    Behavior During Therapy  [facility] for tasks assessed/performed       Past Medical History:  Diagnosis Date  . Abnormal Pap smear    leep  . Asthma   . Infertility   . PCOS (polycystic ovarian syndrome)     Past Surgical History:  Procedure Laterality Date  . LEEP      There were no vitals filed for this visit.   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  I drink 1-1.5 liters/ day of water.  I can't hold my urine anymore.  When I have urgency in my sleep I empty the while bladder.  Pain in pubic symphasis when I need to pee.  Have leakage 1    Currently in Pain?  Yes    Pain Score  6     Pain Location  Bladder    Pain Orientation  Mid    Pain Descriptors / Indicators  Pressure;Cramping    Pain Type  Acute pain    Pain Onset  More than a month ago    Pain Frequency  Intermittent    Aggravating Factors   when need to pee         [facility] PT Assessment - 10/24/17 0001      Assessment   Medical Diagnosis  R35.0 (ICD-10-CM) - Frequency of micturition; N39.41 (ICD-10-CM) - Urge incontinence    Referring Provider  Jerilee Field, MD    Prior Therapy  No      Precautions   Precautions  None      Restrictions   Weight Bearing Restrictions  No      Balance Screen   Has the  patient fallen in the past 6 months  No      Home Environment   Living Environment  Private residence    Living Arrangements  Spouse/significant other;Children      Prior Function   Level of Independence  Independent    Vocation  Full time employment    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      Cognition   Overall Cognitive Status  Within Functional Limits for tasks assessed      Posture/Postural Control   Posture/Postural Control  Postural limitations    Postural Limitations  Anterior pelvic tilt      ROM / Strength   AROM / PROM / Strength  PROM;Strength      PROM   Overall PROM Comments  hip IR/ ER limited 25% bilaterally      Strength   Overall Strength Comments  hip abduction and adduction 4-/5 bilaterally; core strength 4-/5      Palpation   Palpation comment  suprapubic tenderness  Ambulation/Gait   Gait Pattern  Within Functional Limits             Objective measurements completed on examination: See above findings.    Pelvic Floor Special Questions - 10/24/17 0001    Prior Pelvic/Prostate Exam  Yes    Are you Pregnant or attempting pregnancy?  No    Prior Pregnancies  Yes    Number of Pregnancies  1    Number of Vaginal Deliveries  1    Any difficulty with labor and deliveries  No    Episiotomy Performed  No    Currently Sexually Active  Yes    Is this Painful  Yes    Marinoff Scale  pain prevents any attempts at intercourse    Urinary Leakage  Yes 1x every 2 hours    Pad use  4    Activities that cause leaking  Exercising;With strong urge    Urinary urgency  Yes    Urinary frequency  6-8x     Fecal incontinence  No constipation    Caffeine beverages  no    Falling out feeling (prolapse)  -- maybe    Skin Integrity  Intact    Perineal Body/Introitus   Elevated    External Palpation  tender at transverse peroneus attachments    Prolapse  None    Pelvic Floor Internal Exam  pt informed and consent given to perform  internal assessment    Exam Type  Vaginal    Sensation  normal    Palpation  tightness right>left obdurator internus, levator, coccygeus    Strength  weak squeeze, no lift    Strength # of reps  2    Strength # of seconds  1    Tone  high               PT Education - 10/24/17 1350    Education provided  Yes    Education Details  urge to void    Person(s) Educated  Patient    Methods  Explanation;Handout    Comprehension  Verbalized understanding       PT Short Term Goals - 10/24/17 1725      PT SHORT TERM GOAL #1   Title  ind with initial HEP    Time  4    Period  Weeks    Status  New    Target Date  11/21/17      PT SHORT TERM GOAL #2   Title  able to bulge pelvic floor for improved muscle length and decreased bearing down for BM    Time  4    Period  Weeks    Status  New    Target Date  11/21/17        PT Long Term Goals - 10/24/17 1726      PT LONG TERM GOAL #1   Title  patient will demonstrate hip abduction and adduction > or = to 4+/5 MMT for improved ability to perform job related tasks    Baseline  4-/5 MMT    Time  16    Period  Weeks    Status  New    Target Date  02/13/18      PT LONG TERM GOAL #2   Title  reports 1/3 on Marinoff scale for imporved quality of life    Baseline  3/3    Time  16    Period  Weeks    Status  New  Target Date  02/13/18      PT LONG TERM GOAL #3   Title  pt will report using 1 or less panty liners per day due to improved pelvic floor strength    Baseline  3-4 panty liners per day    Time  16    Period  Weeks    Status  New    Target Date  02/13/18      PT LONG TERM GOAL #4   Title  pt will be able to get to the bathroom at night without leakage    Baseline  is currently unable to get out of the bed without leakage at night    Time  16    Period  Weeks    Status  New    Target Date  02/13/18             Plan - 10/24/17 1353    Clinical Impression Statement  Pt presents to clinic today with  urge incontinence and pelvic pain.  Pt has had pelvic pain for years and recent onset of urge incontinence.  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  Pt uses 3-4 pad/day.  She has bilateral hip weakness, anterior pelvic tilt and weakness of her abdominals.  Pt demonstrates 2/5 MMT of pelvic floor and only able to hold contraction 2 seconds.  Pt has high tone pelvic floor with muscle spasms throughout right side more than left.  Pt has decreased hip ROM bilaterally.  Pt will benefit from skilled PT to address these impairments and return to functional activities and improve self care and quallity of life.      History and Personal Factors relevant to plan of care:  vaginal delivery    Clinical Presentation  Evolving    Clinical Presentation due to:  pt is worsening    Clinical Decision Making  Moderate    Rehab Potential  Excellent    PT Frequency  1x / week    PT Duration  -- 16 weeks    PT Treatment/Interventions  ADLs/Self Care Home Management;Biofeedback;Cryotherapy;Electrical Stimulation;Moist Heat;Traction;Ultrasound;Balance training;Therapeutic exercise;Therapeutic activities;Functional mobility training;Stair training;Gait training;Neuromuscular re-education;Patient/family education;Manual techniques;Scar mobilization;Passive range of motion;Dry needling;Taping    PT Next Visit Plan  internal soft tissue, toileting techniques, pelvic floor stretch, breathing and bulging, abdominal massage    Consulted and Agree with Plan of Care  Patient       Patient will benefit from skilled therapeutic intervention in order to improve the following deficits and impairments:  Pain, Improper body mechanics, Impaired tone, Increased muscle spasms, Postural dysfunction, Decreased strength, Decreased range of motion  Visit Diagnosis: Muscle weakness (generalized)  Cramp and spasm     Problem  List Patient Active Problem List   Diagnosis Date Noted  . Chronic pelvic pain in female 07/07/2013  . PCOS (polycystic ovarian syndrome) 04/19/2011  . Amenorrhea 04/19/2011    Vincente PoliJakki Crosser, PT 10/24/2017, 5:40 PM  [facility] [address] [place], [place], [address] Phone: [phone]   Fax:  [phone]  Name: Jasmine Castaneda MRN: [id number] Date of Birth: 06-12-1981

## 2017-10-24 NOTE — Addendum Note (Signed)
Addended by: Dorie RankROSSER, Jeremi Losito D on: 10/24/2017 05:48 PM   Modules accepted: Orders

## 2017-10-24 NOTE — Patient Instructions (Signed)
Relaxation Exercises with the Urge to Void   When you experience an urge to void:  FIRST  Stop and stand very still    Sit down if you can    Don't move    You need to stay very still to maintain control  SECOND Squeeze your pelvic floor muscles 5 times, like a quick flick, to keep from leaking  THIRD Relax  Take a deep breath and then let it out  Try to make the urge go away by using relaxation and visualization techniques  FINALLY When you feel the urge go away somewhat, walk normally to the bathroom.   If the urge gets suddenly stronger on the way, you may stop again and relax to regain control.  [facility] [address], [address] [place], [place] [address] Phone # [phone] Fax [phone]  

## 2017-10-30 ENCOUNTER — Ambulatory Visit: Payer: Medicaid Other | Admitting: Physical Therapy

## 2017-10-30 DIAGNOSIS — R252 Cramp and spasm: Secondary | ICD-10-CM

## 2017-10-30 DIAGNOSIS — M6281 Muscle weakness (generalized): Secondary | ICD-10-CM | POA: Diagnosis not present

## 2017-10-30 NOTE — Therapy (Signed)
[facility] [address], [address] [place], [place], [address] Phone: [phone]   Fax:  [phone]  Physical Therapy Treatment  Patient Details  Name: Jasmine Castaneda MRN: [id number] Date of Birth: [birth year] Referring Provider: Jerilee FieldEskridge, Matthew, MD   Encounter Date: 10/30/2017  PT End of Session - 10/30/17 1359    Visit Number  2    Number of Visits  4    Date for PT Re-Evaluation  02/13/18    Authorization Type  medicaid - re-auth on 11/21/17    PT Start Time  0935    PT Stop Time  1015    PT Time Calculation (min)  40 min    Activity Tolerance  Patient tolerated treatment well    Behavior During Therapy  [facility] for tasks assessed/performed       Past Medical History:  Diagnosis Date  . Abnormal Pap smear    leep  . Asthma   . Infertility   . PCOS (polycystic ovarian syndrome)     Past Surgical History:  Procedure Laterality Date  . LEEP      There were no vitals filed for this visit.  Subjective Assessment - 10/30/17 1551    Subjective  Pt states she has been constipated and bearing down a lot sometimes. No mention of pain at rest.    Currently in Pain?  No/denies                      [facility] Adult PT Treatment/Exercise - 10/30/17 0001      Self-Care   Self-Care  Other Self-Care Comments    Other Self-Care Comments   abdominal massage, toilet techniques      Neuro Re-ed    Neuro Re-ed Details   breathing with approximation and lifting of abdominal muscles      Manual Therapy   Manual Therapy  Myofascial release;Soft tissue mobilization    Myofascial Release  thoracic diaphragm with hands on front and back, pelivc diaphragm one hand on sacrum with towel              PT Education - 10/30/17 1400    Education provided  Yes    Education Details  balloon breath, toileting, ab massage, type of fiber    Person(s) Educated  Patient    Methods   Explanation;Demonstration;Handout;Verbal cues;Tactile cues    Comprehension  Verbalized understanding;Returned demonstration       PT Short Term Goals - 10/24/17 1725      PT SHORT TERM GOAL #1   Title  ind with initial HEP    Time  4    Period  Weeks    Status  New    Target Date  11/21/17      PT SHORT TERM GOAL #2   Title  able to bulge pelvic floor for improved muscle length and decreased bearing down for BM    Time  4    Period  Weeks    Status  New    Target Date  11/21/17        PT Long Term Goals - 10/24/17 1726      PT LONG TERM GOAL #1   Title  patient will demonstrate hip abduction and adduction > or = to 4+/5 MMT for improved ability to perform job related tasks    Baseline  4-/5 MMT    Time  16    Period  Weeks    Status  New  Target Date  02/13/18      PT LONG TERM GOAL #2   Title  reports 1/3 on Marinoff scale for imporved quality of life    Baseline  3/3    Time  16    Period  Weeks    Status  New    Target Date  02/13/18      PT LONG TERM GOAL #3   Title  pt will report using 1 or less panty liners per day due to improved pelvic floor strength    Baseline  3-4 panty liners per day    Time  16    Period  Weeks    Status  New    Target Date  02/13/18      PT LONG TERM GOAL #4   Title  pt will be able to get to the bathroom at night without leakage    Baseline  is currently unable to get out of the bed without leakage at night    Time  16    Period  Weeks    Status  New    Target Date  02/13/18            Plan - 10/30/17 1401    Clinical Impression Statement  Pt was able to demonstrate increased ribcage mobility with fascial release and thoracic diaphragm STM.  Pt able to perform and understand education provided as part of HEP.  Pt continues to need skilled therapy for improved muscle coordination and strenght for self care and functional activities.    PT Treatment/Interventions  ADLs/Self Care Home  Management;Biofeedback;Cryotherapy;Electrical Stimulation;Moist Heat;Traction;Ultrasound;Balance training;Therapeutic exercise;Therapeutic activities;Functional mobility training;Stair training;Gait training;Neuromuscular re-education;Patient/family education;Manual techniques;Scar mobilization;Passive range of motion;Dry needling;Taping    PT Next Visit Plan  biofeedback, internal soft tissue, toileting techniques, pelvic floor stretch, breathing and bulging,     Consulted and Agree with Plan of Care  Patient       Patient will benefit from skilled therapeutic intervention in order to improve the following deficits and impairments:  Pain, Improper body mechanics, Impaired tone, Increased muscle spasms, Postural dysfunction, Decreased strength, Decreased range of motion  Visit Diagnosis: Muscle weakness (generalized)  Cramp and spasm     Problem List Patient Active Problem List   Diagnosis Date Noted  . Chronic pelvic pain in female 07/07/2013  . PCOS (polycystic ovarian syndrome) 04/19/2011  . Amenorrhea 04/19/2011    Vincente Poli, PT 10/30/2017, 3:52 PM  [facility] [address] [place], [place], [address] Phone: [phone]   Fax:  [phone]  Name: Jasmine Castaneda MRN: [id number] Date of Birth: Jun 06, 1981

## 2017-10-30 NOTE — Patient Instructions (Signed)
Balloon Breath    Place hands LIGHTLY on belly below navel. Imagine a balloon inside belly. Blow up balloon on breath IN, deflate balloon on breath OUT. Contract vagina and abdominals slightly to assist breath OUT. Time _5__ minutes.  Copyright  VHI. All rights reserved.   About Abdominal Massage  Abdominal massage, also called external colon massage, is a self-treatment circular massage technique that can reduce and eliminate gas and ease constipation. The colon naturally contracts in waves in a clockwise direction starting from inside the right hip, moving up toward the ribs, across the belly, and down inside the left hip.  When you perform circular abdominal massage, you help stimulate your colon's normal wave pattern of movement called peristalsis.  It is most beneficial when done after eating.  Positioning You can practice abdominal massage with oil while lying down, or in the shower with soap.  Some people find that it is just as effective to do the massage through clothing while sitting or standing.  How to Massage Start by placing your finger tips or knuckles on your right side, just inside your hip bone.  . Make small circular movements while you move upward toward your rib cage.   . Once you reach the bottom right side of your rib cage, take your circular movements across to the left side of the bottom of your rib cage.  . Next, move downward until you reach the inside of your left hip bone.  This is the path your feces travel in your colon. . Continue to perform your abdominal massage in this pattern for 10 minutes each day.     You can apply as much pressure as is comfortable in your massage.  Start gently and build pressure as you continue to practice.  Notice any areas of pain as you massage; areas of slight pain may be relieved as you massage, but if you have areas of significant or intense pain, consult with your healthcare provider.  Other Considerations . General  physical activity including bending and stretching can have a beneficial massage-like effect on the colon.  Deep breathing can also stimulate the colon because breathing deeply activates the same nervous system that supplies the colon.   . Abdominal massage should always be used in combination with a bowel-conscious diet that is high in the proper type of fiber for you, fluids (primarily water), and a regular exercise program.  Toileting Techniques for Bowel Movements (Defecation) Using your belly (abdomen) and pelvic floor muscles to have a bowel movement is usually instinctive.  Sometimes people can have problems with these muscles and have to relearn proper defecation (emptying) techniques.  If you have weakness in your muscles, organs that are falling out, decreased sensation in your pelvis, or ignore your urge to go, you may find yourself straining to have a bowel movement.  You are straining if you are: . holding your breath or taking in a huge gulp of air and holding it  . keeping your lips and jaw tensed and closed tightly . turning red in the face because of excessive pushing or forcing . developing or worsening your  hemorrhoids . getting faint while pushing . not emptying completely and have to defecate many times a day  If you are straining, you are actually making it harder for yourself to have a bowel movement.  Many people find they are pulling up with the pelvic floor muscles and closing off instead of opening the anus. Due to lack pelvic floor  relaxation and coordination the abdominal muscles, one has to work harder to push the feces out.  Many people have never been taught how to defecate efficiently and effectively.  Notice what happens to your body when you are having a bowel movement.  While you are sitting on the toilet pay attention to the following areas: . Jaw and mouth position . Angle of your hips   . Whether your feet touch the ground or not . Arm placement  . Spine  position . Waist . Belly tension . Anus (opening of the anal canal)  An Evacuation/Defecation Plan   Here are the 4 basic points:  1. Lean forward enough for your elbows to rest on your knees 2. Support your feet on the floor or use a low stool if your feet don't touch the floor  3. Push out your belly as if you have swallowed a beach ball-you should feel a widening of your waist 4. Open and relax your pelvic floor muscles, rather than tightening around the anus      The following conditions my require modifications to your toileting posture:  . If you have had surgery in the past that limits your back, hip, pelvic, knee or ankle flexibility . Constipation   Your healthcare practitioner may make the following additional suggestions and adjustments:  1) Sit on the toilet  a) Make sure your feet are supported. b) Notice your hip angle and spine position-most people find it effective to lean forward or raise their knees, which can help the muscles around the anus to relax  c) When you lean forward, place your forearms on your thighs for support  2) Relax suggestions a) Breath deeply in through your nose and out slowly through your mouth as if you are smelling the flowers and blowing out the candles. b) To become aware of how to relax your muscles, contracting and releasing muscles can be helpful.  Pull your pelvic floor muscles in tightly by using the image of holding back gas, or closing around the anus (visualize making a circle smaller) and lifting the anus up and in.  Then release the muscles and your anus should drop down and feel open. Repeat 5 times ending with the feeling of relaxation. c) Keep your pelvic floor muscles relaxed; let your belly bulge out. d) The digestive tract starts at the mouth and ends at the anal opening, so be sure to relax both ends of the tube.  Place your tongue on the roof of your mouth with your teeth separated.  This helps relax your mouth and will help  to relax the anus at the same time.  3) Empty (defecation) a) Keep your pelvic floor and sphincter relaxed, then bulge your anal muscles.  Make the anal opening wide.  b) Stick your belly out as if you have swallowed a beach ball. c) Make your belly wall hard using your belly muscles while continuing to breathe. Doing this makes it easier to open your anus. d) Breath out and give a grunt (or try using other sounds such as ahhhh, shhhhh, ohhhh or grrrrrrr).  4) Finish a) As you finish your bowel movement, pull the pelvic floor muscles up and in.  This will leave your anus in the proper place rather than remaining pushed out and down. If you leave your anus pushed out and down, it will start to feel as though that is normal and give you incorrect signals about needing to have a bowel movement.  [facility] [address] [place], [place] [address] Types of Fiber  There are two main types of fiber:  insoluble and soluble.  Both of these types can prevent and relieve constipation and diarrhea, although some people find one or the other to be more easily digested.  This handout details information about both types of fiber.  Insoluble Fiber       Functions of Insoluble Fiber . moves bulk through the intestines  . controls and balances the pH (acidity) in the intestines       Benefits of Insoluble Fiber . promotes regular bowel movement and prevents constipation  . removes fecal waste through colon in less time  . keeps an optimal pH in intestines to prevent microbes from producing cancer substances, therefore preventing colon cancer        Food Sources of Insoluble Fiber . whole-wheat products  . wheat bran "miller's bran" . corn bran  . flax seed or other seeds . vegetables such as green beans, broccoli, cauliflower and potato skins  . fruit skins and root vegetable skins  . popcorn . brown rice  Soluble Fiber       Functions of Soluble Fiber   . holds water in the colon to bulk and soften the stool . prolongs stomach emptying time so that sugar is released and absorbed more slowly        Benefits of Soluble Fiber . lowers total cholesterol and LDL cholesterol (the bad cholesterol) therefore reducing the risk of heart disease  . regulates blood sugar for people with diabetes       Food Sources of Soluble Fiber . oat/oat bran . dried beans and peas  . nuts  . barley  . Ground flax seed or other seeds . fruits such as oranges, pears, peaches, and apples  . vegetables such as carrots  . psyllium husk  . prunes  *Magnesium - citrate (check with doctor - never more than 1000mg ; typically 400-600mg )  [facility] [address], [address] [place], [place] [address] Phone # [phone] Fax [phone]

## 2017-11-06 ENCOUNTER — Ambulatory Visit: Payer: Medicaid Other | Admitting: Physical Therapy

## 2017-11-06 DIAGNOSIS — M6281 Muscle weakness (generalized): Secondary | ICD-10-CM

## 2017-11-06 DIAGNOSIS — R252 Cramp and spasm: Secondary | ICD-10-CM

## 2017-11-06 NOTE — Patient Instructions (Signed)
   HIP ADDUCTION SQUEEZE - SUPINE  Place a rolled up towel, ball or pillow between your knees and press your knees together so that you squeeze the object firmly. Squeeze to close the openings of the pelvic floor and then lifting the pelvic floor up. Hold and then release and repeat.    Hold 2 seconds, rest 5 seconds - repeat 10x and do 2 sets     Hold stretch and breathe into the hip letting the pelvic floor drop on the inhale.  Hold 30 sec and do 3 reps on each side    [facility] [address], [address] [place], [place] [address] Phone # [phone] Fax [phone]

## 2017-11-06 NOTE — Therapy (Signed)
[facility] [address], [address] [place], [place], [address] Phone: [phone]   Fax:  [phone]  Physical Therapy Treatment  Patient Details  Name: Jasmine Castaneda MRN: [id number] Date of Birth: July 17, 1981 Referring Provider: Jerilee Field, MD   Encounter Date: 11/06/2017  PT End of Session - 11/06/17 0904    Visit Number  3    Number of Visits  4    Date for PT Re-Evaluation  02/13/18    Authorization Type  medicaid - re-auth on 11/21/17    PT Start Time  0903 pt arrived 18 min late    PT Stop Time  0930    PT Time Calculation (min)  27 min    Activity Tolerance  Patient tolerated treatment well    Behavior During Therapy  [facility] for tasks assessed/performed       Past Medical History:  Diagnosis Date  . Abnormal Pap smear    leep  . Asthma   . Infertility   . PCOS (polycystic ovarian syndrome)     Past Surgical History:  Procedure Laterality Date  . LEEP      There were no vitals filed for this visit.  Subjective Assessment - 11/06/17 0905    Subjective  Pt reports she has been more constipated lately and doesn't feel like the squeezing is working when having the urgency.    Currently in Pain?  No/denies                      [facility] Adult PT Treatment/Exercise - 11/06/17 0001      Neuro Re-ed    Neuro Re-ed Details   tactile cues to facilitate muscles to lift and relax - hold 2 sec, release 5 sec; breathing with bulging      Manual Therapy   Manual Therapy  Internal Pelvic Floor    Manual therapy comments  pt informed and consent given to perform internal soft tissue release    Internal Pelvic Floor  obdurators, levator ani, fascial release left>right               PT Short Term Goals - 11/06/17 1304      PT SHORT TERM GOAL #1   Title  ind with initial HEP    Time  4    Period  Weeks    Status  On-going      PT SHORT TERM GOAL #2   Title  able to bulge pelvic  floor for improved muscle length and decreased bearing down for BM    Time  4    Period  Weeks    Status  On-going        PT Long Term Goals - 11/06/17 1304      PT LONG TERM GOAL #1   Title  patient will demonstrate hip abduction and adduction > or = to 4+/5 MMT for improved ability to perform job related tasks    Baseline  4-/5 MMT    Time  16    Period  Weeks    Status  On-going      PT LONG TERM GOAL #2   Title  reports 1/3 on Marinoff scale for imporved quality of life    Time  16    Period  Weeks    Status  On-going      PT LONG TERM GOAL #3   Title  pt will report using 1 or less panty liners per day  due to improved pelvic floor strength    Time  16    Period  Weeks    Status  On-going      PT LONG TERM GOAL #4   Title  pt will be able to get to the bathroom at night without leakage    Time  16    Period  Weeks    Status  On-going            Plan - 11/06/17 1257    Clinical Impression Statement  Pt arrived late today.  She was able to demonstrate pelvic contraction and relaxation with tactile cues followed by ability to do on her own.  Pt will benefit from skilled PT to continue working on muslce coordination so she can improve bladder control.    PT Treatment/Interventions  ADLs/Self Care Home Management;Biofeedback;Cryotherapy;Electrical Stimulation;Moist Heat;Traction;Ultrasound;Balance training;Therapeutic exercise;Therapeutic activities;Functional mobility training;Stair training;Gait training;Neuromuscular re-education;Patient/family education;Manual techniques;Scar mobilization;Passive range of motion;Dry needling;Taping    PT Next Visit Plan  biofeedback, internal soft tissue, toileting techniques, pelvic floor stretch, breathing and bulging,     Consulted and Agree with Plan of Care  Patient       Patient will benefit from skilled therapeutic intervention in order to improve the following deficits and impairments:  Pain, Improper body mechanics,  Impaired tone, Increased muscle spasms, Postural dysfunction, Decreased strength, Decreased range of motion  Visit Diagnosis: Muscle weakness (generalized)  Cramp and spasm     Problem List Patient Active Problem List   Diagnosis Date Noted  . Chronic pelvic pain in female 07/07/2013  . PCOS (polycystic ovarian syndrome) 04/19/2011  . Amenorrhea 04/19/2011    Vincente PoliJakki Crosser, PT 11/06/2017, 1:05 PM  [facility] [address], [address] [place], [place], [address] Phone: [phone]   Fax:  [phone]  Name: Jasmine Castaneda MRN: [id number] Date of Birth: Nov 10, 1980

## 2017-11-13 ENCOUNTER — Ambulatory Visit: Payer: Medicaid Other | Attending: Urology | Admitting: Physical Therapy

## 2017-11-13 DIAGNOSIS — R252 Cramp and spasm: Secondary | ICD-10-CM

## 2017-11-13 DIAGNOSIS — M6281 Muscle weakness (generalized): Secondary | ICD-10-CM | POA: Insufficient documentation

## 2017-11-13 NOTE — Patient Instructions (Signed)
Functional Quadriceps: Sit to Stand    Sit on edge of chair, feet flat on floor. Stand upright, extending knees fully. Squeeze pelvic floor before standing Repeat __10__ times per set. Do __1__ sets per session. Do __2__ sessions per day.  http://orth.exer.us/735   Copyright  VHI. All rights reserved.  ADDUCTION: Isometric    With ball between knees, squeeze them inward. Squeezing pelvic floor like you are drawing a marble into the vagina.  Hold _2-3__ seconds. Complete _10__ sets of _1__ repetitions. Perform __2_ sessions per day.  http://gtsc.exer.us/125   Copyright  VHI. All rights reserved.   [facility] [address], [address] [place], [place] [address] Phone # [phone] Fax [phone]

## 2017-11-13 NOTE — Therapy (Addendum)
[facility] [address] [place], [place], [address] Phone: [phone]   Fax:  [phone]  Physical Therapy Treatment  Patient Details  Name: Jasmine Castaneda MRN: [id number] Date of Birth: [birth year] Referring Provider: Festus Aloe, MD   Encounter Date: 11/13/2017  PT End of Session - 11/13/17 0930    Visit Number  4    Number of Visits  4    Date for PT Re-Evaluation  02/13/18    Authorization Type  medicaid - re-auth on 11/21/17    PT Start Time  0858 pt arrived late    PT Stop Time  0930    PT Time Calculation (min)  32 min    Activity Tolerance  Patient tolerated treatment well    Behavior During Therapy  [facility] for tasks assessed/performed       Past Medical History:  Diagnosis Date  . Abnormal Pap smear    leep  . Asthma   . Infertility   . PCOS (polycystic ovarian syndrome)     Past Surgical History:  Procedure Laterality Date  . LEEP      There were no vitals filed for this visit.  Subjective Assessment - 11/13/17 1750    Subjective  Pt states she is still constipated.  States she is able to get out of bed without leakage, but still has as she walks to the bathroom.    Currently in Pain?  No/denies                      [facility] Adult PT Treatment/Exercise - 11/13/17 0001      Self-Care   Other Self-Care Comments   review and perormed mindfulness activity with toileting technique with breathing and bulging      Neuro Re-ed    Neuro Re-ed Details   tactile cues to pelvic floor to elevate and contract correctly during exercises      Exercises   Exercises  Lumbar      Lumbar Exercises: Seated   Other Seated Lumbar Exercises  pelvic floor contract and relax with and without ball squeeze - 2 x 10      Lumbar Exercises: Supine   Ab Set  10 reps ball squeeze with pelvic floor contraction; 3 sec hold 2 set             PT Education - 11/13/17 0929    Education  provided  Yes    Education Details  ball squeeze 3 sec hold, sit to stand with PF contract    Person(s) Educated  Patient    Methods  Explanation;Demonstration;Handout;Verbal cues;Tactile cues    Comprehension  Verbalized understanding;Returned demonstration       PT Short Term Goals - 11/06/17 1304      PT SHORT TERM GOAL #1   Title  ind with initial HEP    Time  4    Period  Weeks    Status  On-going      PT SHORT TERM GOAL #2   Title  able to bulge pelvic floor for improved muscle length and decreased bearing down for BM    Time  4    Period  Weeks    Status  On-going        PT Long Term Goals - 11/13/17 3094      PT LONG TERM GOAL #1   Title  patient will demonstrate hip abduction and adduction > or = to 4+/5 MMT for  improved ability to perform job related tasks    Baseline  4/5     Time  16    Period  Weeks    Status  On-going      PT LONG TERM GOAL #2   Title  reports 1/3 on Marinoff scale for imporved quality of life    Baseline  has not attempted    Time  16    Period  Weeks    Status  On-going      PT LONG TERM GOAL #3   Title  pt will report using 1 or less panty liners per day due to improved pelvic floor strength    Baseline  2-3 improved from 3-4 panty liners per day    Time  16    Period  Weeks    Status  On-going      PT LONG TERM GOAL #4   Title  pt will be able to get to the bathroom at night without leakage    Baseline  have been able to get up and not leak but still leaking a little on the way    Time  16    Period  Weeks    Status  On-going            Plan - 11/13/17 0931    Clinical Impression Statement  Pt arrived late today.  She demonstrates with tactile feedback improved ability to elevate pelvic floor.  Pt has not had much time up to this point to receive manual therapy techniques to work on reduced muscle spasms due to several late arrivals.  Pt demonstrates progress toward goals with increased hip strength and reduced pad  usage overall.  Pt has not met any goals at this time.  She will continue to benefit from skilled PT to work on strength and muscle coordination for ability to function at work and for improved quality of life activities.    PT Treatment/Interventions  ADLs/Self Care Home Management;Biofeedback;Cryotherapy;Electrical Stimulation;Moist Heat;Traction;Ultrasound;Balance training;Therapeutic exercise;Therapeutic activities;Functional mobility training;Stair training;Gait training;Neuromuscular re-education;Patient/family education;Manual techniques;Scar mobilization;Passive range of motion;Dry needling;Taping    PT Next Visit Plan  biofeedback, internal soft tissue, toileting techniques, pelvic floor stretch, breathing and bulging, lower abdominal and anterior pelvic floor strengthening    Consulted and Agree with Plan of Care  Patient       Patient will benefit from skilled therapeutic intervention in order to improve the following deficits and impairments:  Pain, Improper body mechanics, Impaired tone, Increased muscle spasms, Postural dysfunction, Decreased strength, Decreased range of motion  Visit Diagnosis: Muscle weakness (generalized)  Cramp and spasm     Problem List Patient Active Problem List   Diagnosis Date Noted  . Chronic pelvic pain in female 07/07/2013  . PCOS (polycystic ovarian syndrome) 04/19/2011  . Amenorrhea 04/19/2011    Zannie Cove, PT 11/13/2017, 5:52 PM  [facility] [address] [place], [place], [address] Phone: [phone]   Fax:  [phone]  Name: Jasmine Castaneda MRN: [id number] Date of Birth: Jan 12, 1981  PHYSICAL THERAPY DISCHARGE SUMMARY  Visits from Start of Care: 4  Current functional level related to goals / functional outcomes: See above   Remaining deficits: See above   Education / Equipment: HEP Plan: Patient agrees to discharge.  Patient goals were not met. Patient  is being discharged due to not returning since the last visit.  ?????     [employer], PT 02/22/18 11:57 AM

## 2017-12-17 ENCOUNTER — Other Ambulatory Visit: Payer: Self-pay | Admitting: Obstetrics & Gynecology

## 2017-12-17 DIAGNOSIS — B379 Candidiasis, unspecified: Secondary | ICD-10-CM

## 2018-01-29 ENCOUNTER — Other Ambulatory Visit (HOSPITAL_COMMUNITY)
Admission: RE | Admit: 2018-01-29 | Discharge: 2018-01-29 | Disposition: A | Discharge status: Home / Self Care | Payer: Medicaid Other | Source: Ambulatory Visit | Attending: Obstetrics and Gynecology | Admitting: Obstetrics and Gynecology

## 2018-01-29 ENCOUNTER — Ambulatory Visit (INDEPENDENT_AMBULATORY_CARE_PROVIDER_SITE_OTHER): Payer: Medicaid Other | Admitting: Obstetrics and Gynecology

## 2018-01-29 ENCOUNTER — Encounter: Payer: Self-pay | Admitting: Obstetrics and Gynecology

## 2018-01-29 VITALS — BP 133/87 | HR 88

## 2018-01-29 DIAGNOSIS — N898 Other specified noninflammatory disorders of vagina: Secondary | ICD-10-CM

## 2018-01-29 DIAGNOSIS — N644 Mastodynia: Secondary | ICD-10-CM

## 2018-01-29 DIAGNOSIS — Z01419 Encounter for gynecological examination (general) (routine) without abnormal findings: Secondary | ICD-10-CM | POA: Diagnosis not present

## 2018-01-29 LAB — POCT PREGNANCY, URINE: Preg Test, Ur: NEGATIVE

## 2018-01-30 NOTE — Progress Notes (Signed)
GYNECOLOGY OFFICE VISIT NOTE   Chief Complaint  Patient presents with  . Vaginal Discharge   History:  37 y.o. G1P0101 here today for vaginal discharge. Patient states that she has had increased vaginal discharge with associated odor for the last 1-2 weeks. She took a dose of Diflucan that she had thinking it was yeast but this did not help. She is in a monogamous relationship with her husband, She has a h/o BV and thinks this may be it. States the only new changes she did was use preseed lubrication as she is trying to conceive. Used it once and shortly after discharge started. Has h/o PCOS. Patient's last menstrual period was 12/26/2017 (exact date).   Patient also endorsing increased breast size and tenderness. Does not believe she is pregnant.   She denies any abnormal bleeding, pelvic pain, fevers, n/v, headaches, or other concerns.   Past Medical History:  Diagnosis Date  . Abnormal Pap smear    leep  . Asthma   . Infertility   . PCOS (polycystic ovarian syndrome)     Past Surgical History:  Procedure Laterality Date  . LEEP     Current Outpatient Medications on File Prior to Visit  Medication Sig Dispense Refill  . Aspirin-Salicylamide-Caffeine (BC HEADACHE POWDER PO) Take 2 packets by mouth daily as needed (for pain/headaches).    . diphenhydrAMINE (BENADRYL) 25 mg capsule Take 25 mg by mouth every 6 (six) hours as needed for allergies.    . Multiple Vitamin (MULTIVITAMIN WITH MINERALS) TABS tablet Take 1 tablet by mouth daily.    . Probiotic Product (PROBIOTIC PO) Take 1 capsule by mouth daily.     No current facility-administered medications on file prior to visit.    The following portions of the patient's history were reviewed and updated as appropriate: allergies, current medications, past family history, past medical history, past social history, past surgical history and problem list.   Health Maintenance:  Due for pap smear.   Review of Systems:  Pertinent  items noted in HPI.   Objective:  Physical Exam BP 133/87   Pulse 88   LMP 12/26/2017 (Exact Date)  CONSTITUTIONAL: Well-developed, well-nourished female in no acute distress.  HENT:  Normocephalic, atraumatic. External right and left ear normal.  EYES: Conjunctivae and EOM are normal. Pupils are equal, round, and reactive to light. No scleral icterus.  NECK: Normal range of motion, supple, no masses SKIN: Skin is warm and dry. No rash noted. Not diaphoretic. No erythema. No pallor. NEUROLOGIC: Alert and oriented to person, place, and time. Normal muscle tone coordination. No cranial nerve deficit noted. PSYCHIATRIC: Normal mood and affect. Normal behavior. Normal judgment and thought content. CARDIOVASCULAR: Normal heart rate noted RESPIRATORY: Effort and breath sounds normal, no problems with respiration noted ABDOMEN: Soft, non-tender, no distention noted.  BREAST: breasts appear normal, no suspicious masses, no skin or nipple changes or axillary nodes. PELVIC: Normal appearing external genitalia; normal appearing vaginal mucosa and cervix.  Increased discharge noted that is malodorous and yellow-tinged in color.  Normal uterine size, no other palpable masses, no uterine or adnexal tenderness. MUSCULOSKELETAL: Normal range of motion. No edema noted.  Labs and Imaging Results for orders placed or performed in visit on 01/29/18  Pregnancy, urine POC  Result Value Ref Range   Preg Test, Ur NEGATIVE NEGATIVE    Assessment & Plan:  1. Vaginal discharge Cultures collected for STD testing. Concerned about BV with odor and appearance of discharge. Patient also could have  a cervicitis of unknown source. She meets clinic criteria for such. Will await cultures before treating.   2. Well woman exam with routine gynecological exam - Cytology - PAP  3. Breast tenderness Patient with bilateral breast tenderness and fullness. Upreg negative. Most likely associated with oncoming menses.   No  follow-ups on file.   Total face-to-face time with patient: 25 minutes. Over 50% of encounter was spent on counseling and coordination of care.  Caryl Ada, DO OB Fellow Center for [facility], [facility]

## 2018-01-31 ENCOUNTER — Other Ambulatory Visit: Payer: Self-pay | Admitting: Obstetrics and Gynecology

## 2018-01-31 LAB — CYTOLOGY - PAP
Bacterial vaginitis: POSITIVE — AB
Candida vaginitis: NEGATIVE
Chlamydia: NEGATIVE
DIAGNOSIS: NEGATIVE
HPV: NOT DETECTED
NEISSERIA GONORRHEA: NEGATIVE
Trichomonas: NEGATIVE

## 2018-01-31 MED ORDER — METRONIDAZOLE 0.75 % VA GEL: 1.0000 | Freq: Two times a day (BID) | VAGINAL | 0 refills | Status: DC

## 2018-02-05 ENCOUNTER — Telehealth: Payer: Self-pay

## 2018-02-05 DIAGNOSIS — N760 Acute vaginitis: Principal | ICD-10-CM

## 2018-02-05 DIAGNOSIS — B9689 Other specified bacterial agents as the cause of diseases classified elsewhere: Secondary | ICD-10-CM

## 2018-02-05 MED ORDER — TINIDAZOLE 500 MG PO TABS
2.0000 g | ORAL_TABLET | Freq: Every day | ORAL | 0 refills | Status: AC
Start: 2018-02-05 — End: 2018-02-07

## 2018-02-05 NOTE — Telephone Encounter (Signed)
Patient called requesting results.  

## 2018-02-05 NOTE — Telephone Encounter (Signed)
Called patient & informed her of pap results showing BV. Patient verbalized understanding & requests Rx for something other than clindamycin. States she cannot take flagyl or metrogel. Sent in Rx for Tindamax per protocol. Discussed with patient she will need to pay out of pocket as it isn't covered by medicaid. Patient verbalized understanding & had no questions.

## 2018-03-23 IMAGING — US US TRANSVAGINAL NON-OB
1 series · 15 of 25 positions shown · non-contrast
Comparison: 10/13/2016

CLINICAL DATA: Irregular periods.  Chronic pelvic pain.

EXAM:
ULTRASOUND PELVIS TRANSVAGINAL
TECHNIQUE: Transvaginal ultrasound examination of the pelvis was performed
including evaluation of the uterus, ovaries, adnexal regions, and
pelvic cul-de-sac.

[Series 1: us transvaginal non-ob · 15 of 48 slices shown]
[im 1/48]
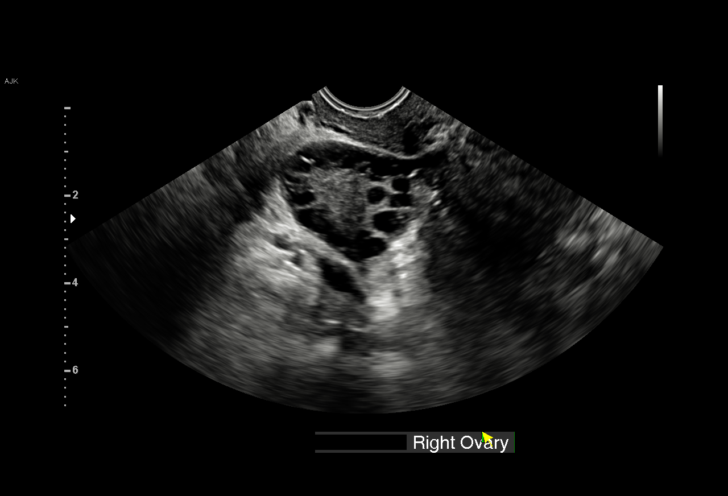
[im 4/48]
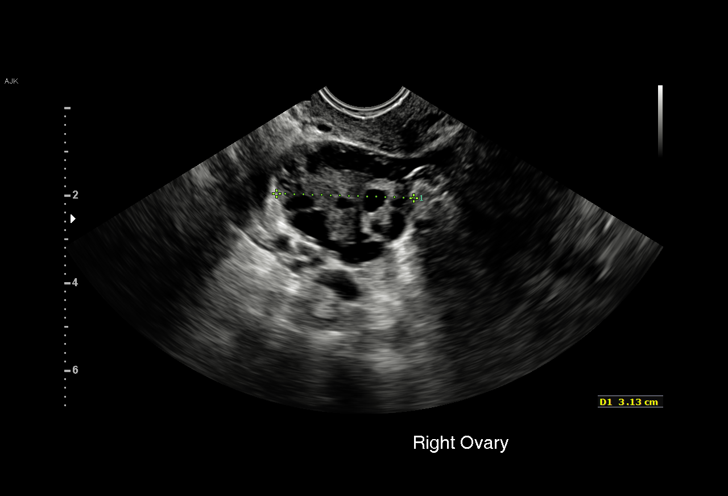
[im 8/48]
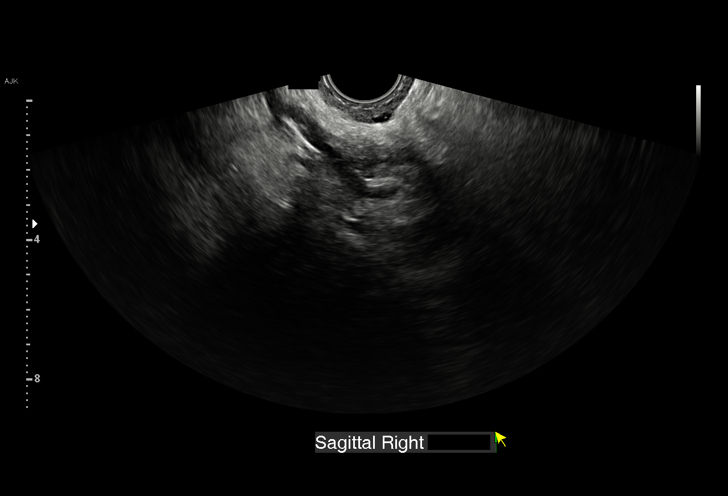
[im 10/48]
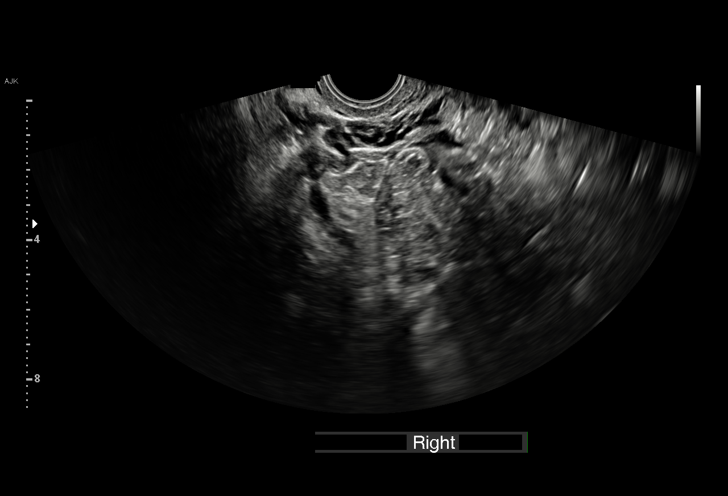
[im 14/48]
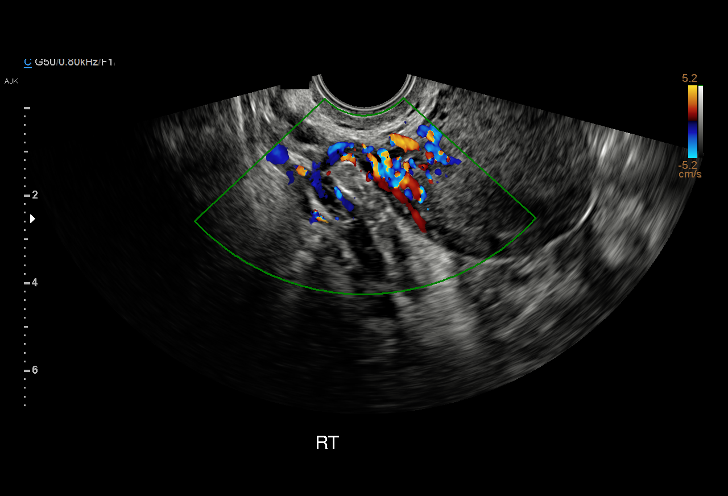
[im 18/48]
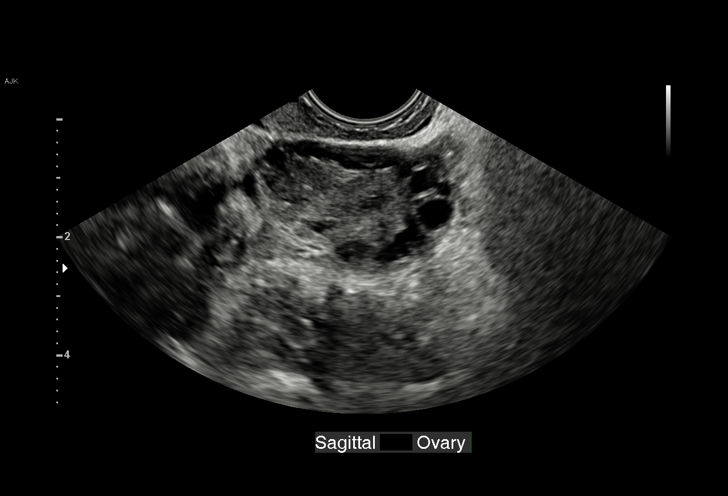
[im 20/48]
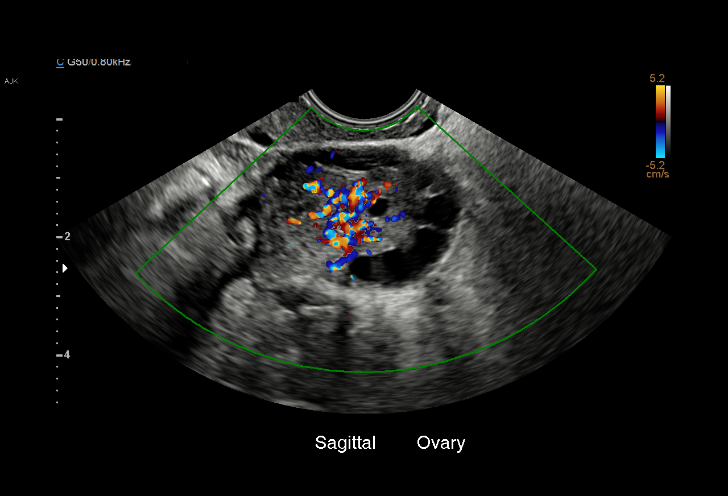
[im 24/48]
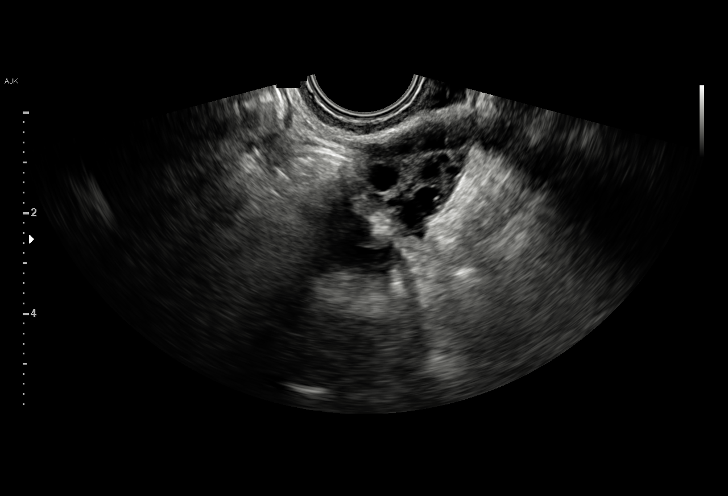
[im 28/48]
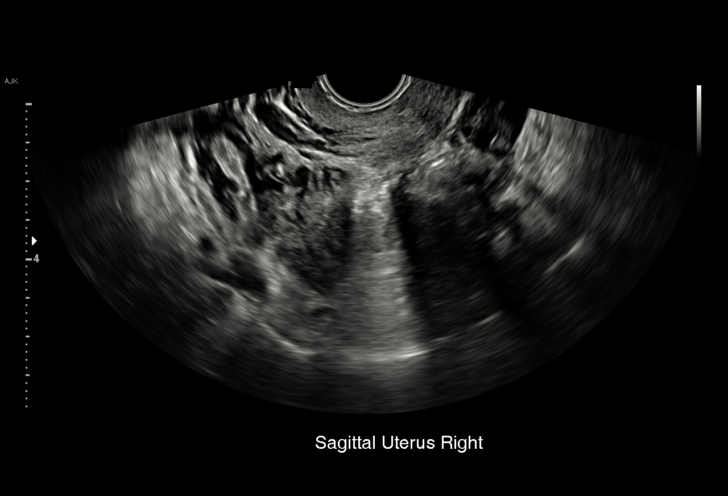
[im 30/48]
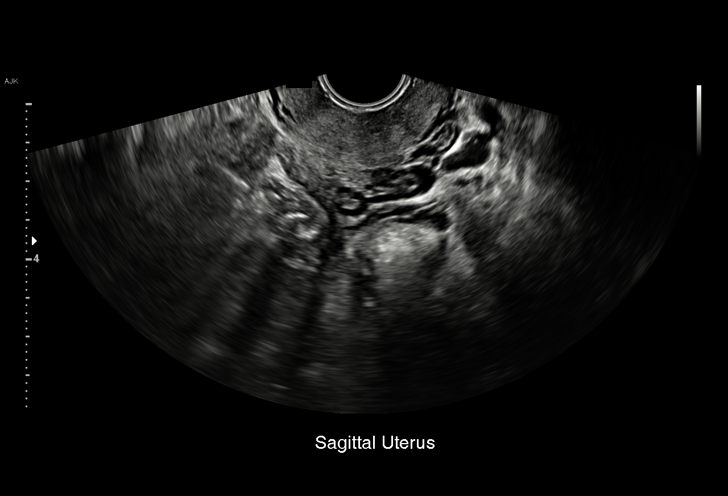
[im 34/48]
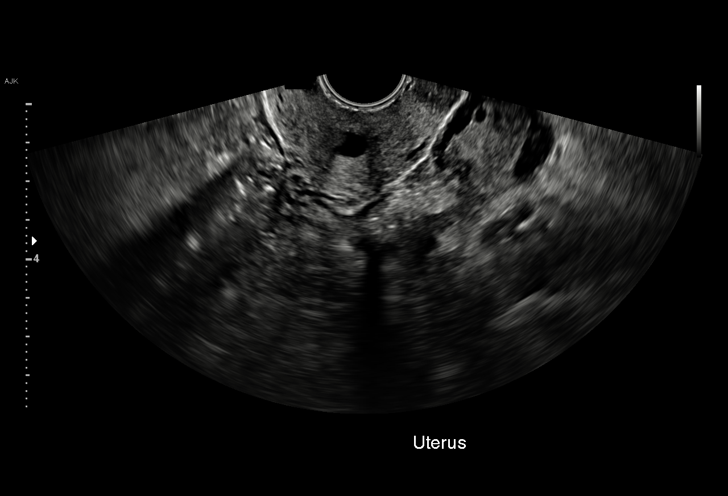
[im 38/48]
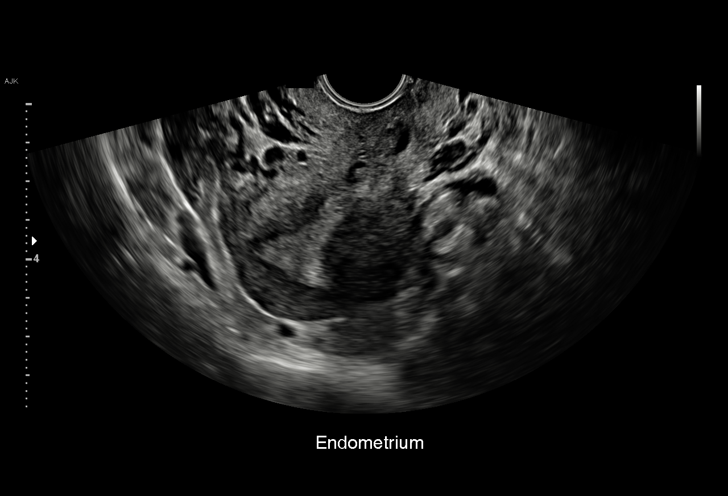
[im 40/48]
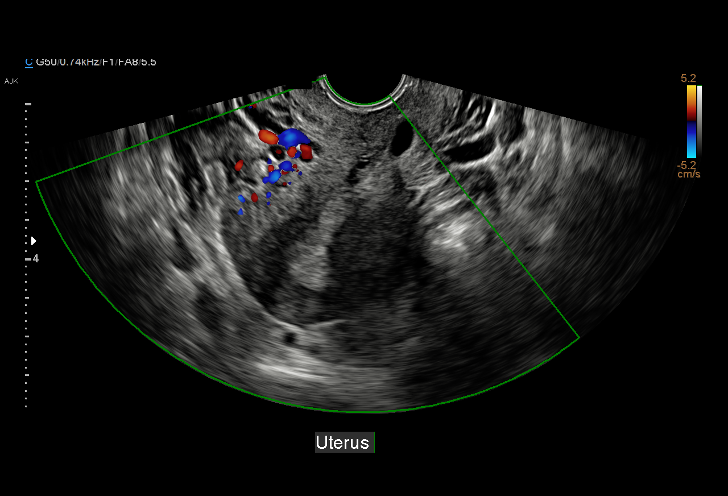
[im 44/48]
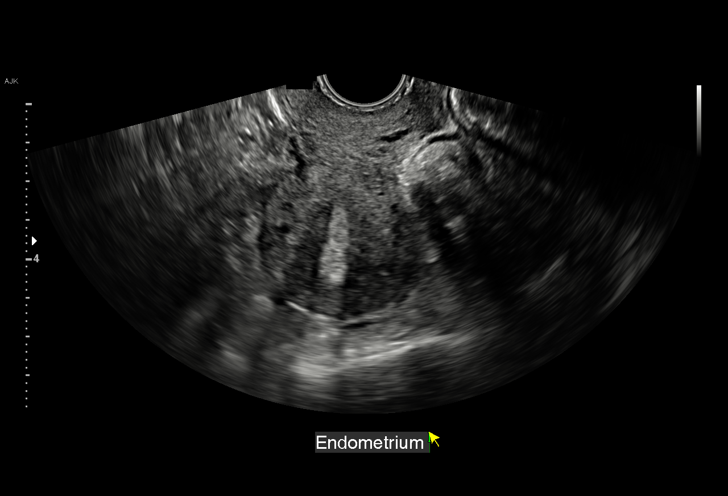
[im 48/48]
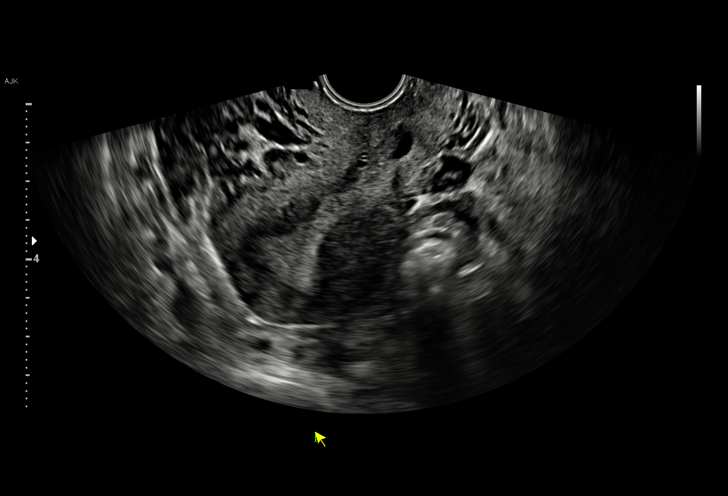

[15 of 25 positions shown; findings below may reference images not displayed]

FINDINGS: Uterus

Measurements: 7.9 x 3.9 x 4.8 cm. No fibroids or other mass
visualized.

Endometrium

Thickness: 8 mm in thickness. Previously seen echogenic area in the
endometrium within the lower uterine segment is not visualized on
today's study.

Right ovary

Measurements: 4.5 x 2.1 x 3.1 cm. Normal appearance/no adnexal mass.

Left ovary

Measurements: 3.5 x 2.1 x 3.2 cm. Normal appearance/no adnexal mass.

Other findings:  No abnormal free fluid
IMPRESSION: Unremarkable study. Previously seen echogenic area within the
endometrium in the lower uterus segment not visualized on today's
study.

## 2018-06-10 ENCOUNTER — Encounter: Payer: Self-pay | Admitting: Internal Medicine

## 2018-06-10 ENCOUNTER — Other Ambulatory Visit (HOSPITAL_COMMUNITY)
Admission: RE | Admit: 2018-06-10 | Discharge: 2018-06-10 | Disposition: A | Discharge status: Home / Self Care | Payer: Medicaid Other | Source: Ambulatory Visit | Attending: Internal Medicine | Admitting: Internal Medicine

## 2018-06-10 ENCOUNTER — Ambulatory Visit (INDEPENDENT_AMBULATORY_CARE_PROVIDER_SITE_OTHER): Payer: Medicaid Other | Admitting: Internal Medicine

## 2018-06-10 VITALS — BP 153/98 | HR 66 | Ht 68.0 in | Wt 168.7 lb

## 2018-06-10 DIAGNOSIS — N898 Other specified noninflammatory disorders of vagina: Secondary | ICD-10-CM

## 2018-06-10 MED ORDER — CLINDAMYCIN HCL 300 MG PO CAPS: 300.0000 mg | ORAL_CAPSULE | Freq: Two times a day (BID) | ORAL | 0 refills | Status: DC

## 2018-06-10 NOTE — Progress Notes (Signed)
   Subjective:    Jasmine Castaneda - 37 y.o. female MRN [id number]  Date of birth: 25-Oct-1980  HPI  Jasmine Castaneda is a 37 y.o. G36P0101 female here for vaginal discharge. Reports having vaginal discharge x3 weeks since her menstrual period started. Faint foul odor. Denies itching and burning in vaginal region. Patient with reported history of frequent BV infections. She is allergic to Flagyl. Has taken Clindamycin in the past but feels like infection always returns. She has tried Boric acid for about 5-7 days in the past.     OB History    Gravida  1   Para  1   Term      Preterm  1   AB  0   Living  1     SAB  0   TAB  0   Ectopic  0   Multiple  0   Live Births  1            -  reports that she has never smoked. She has never used smokeless tobacco. - Review of Systems: Per HPI. - Past Medical History: Patient Active Problem List   Diagnosis Date Noted  . Chronic pelvic pain in female 07/07/2013  . PCOS (polycystic ovarian syndrome) 04/19/2011  . Amenorrhea 04/19/2011   - Medications: reviewed and updated   Objective:   Physical Exam BP (!) 153/98   Pulse 66   Ht 5\' 8"  (1.727 m)   Wt 168 lb 11.2 oz (76.5 kg)   LMP 05/30/2018 (Exact Date)   BMI 25.65 kg/m  Gen: NAD, alert, cooperative with exam, well-appearing GU/GYN: Exam performed in the presence of a chaperone. External genitalia within normal limits.  Vaginal mucosa pink, moist, normal rugae.  Nonfriable cervix without lesions or bleeding noted on speculum exam. Copious amount of thin white discharge with foul odor present. Bimanual exam revealed normal, nongravid uterus.  No cervical motion tenderness. No adnexal masses bilaterally.           Assessment & Plan:   1. Vaginal discharge Exam appears consistent with BV. Given exam findings and history, will treat empirically with Clindamycin. Will obtain other cultures. Discussed 35 day use of boric acid suppositories. If patient has had  more than 3 episodes of recurrent BV in the past year may benefit from prophylaxis.  - Cervicovaginal ancillary only - clindamycin (CLEOCIN) 300 MG capsule; Take 1 capsule (300 mg total) by mouth 2 (two) times daily.  Dispense: 14 capsule; Refill: 0   Routine preventative health maintenance measures emphasized. Please refer to After Visit Summary for other counseling recommendations.   No follow-ups on file.  Marcy Siren, D.O. OB Fellow  06/10/2018, 6:31 PM

## 2018-06-10 NOTE — Progress Notes (Signed)
Pt reports pelvic pain and thick, white vaginal discharge w/odor x3 weeks.

## 2018-06-10 NOTE — Patient Instructions (Signed)
Your vaginal discharge looks concerning for bacterial vaginosis. I have sent in a 7 day course of Clindamycin.

## 2018-06-11 ENCOUNTER — Telehealth: Payer: Self-pay | Admitting: General Practice

## 2018-06-11 NOTE — Telephone Encounter (Signed)
Patient called and left message on nurse voicemail line stating she was seen yesterday and was supposed to have a Rx sent in but nothing is at her pharmacy. Called [facility] and clindamycin Rx is ready. Called & informed patient. Patient verbalized understanding and states Dr Earlene Plater talked to her about preventative treatment but nothing was sent in for that either. Discussed with patient it looks she was advising boric acid suppositories which she can buy OTC or online. Patient states she has done that before but it didn't work as well as when she got a Rx to [facility] for the boric acid. Told patient I would send a message to Dr Earlene Plater letting her know. Patient verbalized understanding & had no other questions.

## 2018-06-12 ENCOUNTER — Other Ambulatory Visit: Payer: Self-pay | Admitting: Internal Medicine

## 2018-06-12 LAB — CERVICOVAGINAL ANCILLARY ONLY
Bacterial vaginitis: POSITIVE — AB
CANDIDA VAGINITIS: NEGATIVE
CHLAMYDIA, DNA PROBE: NEGATIVE
Neisseria Gonorrhea: NEGATIVE
Trichomonas: NEGATIVE

## 2018-06-12 MED ORDER — BORIC ACID POWD: 2 refills | Status: DC

## 2018-06-12 NOTE — Progress Notes (Signed)
I have sent a Rx for boric acid to the [facility] with instructions for use. Her swab came back positive for bacterial vaginosis as we expected.   Marcy Siren, D.O. [facility] Family Medicine Fellow, [facility] for [facility], [facility] 06/12/2018, 4:41 PM

## 2019-05-05 ENCOUNTER — Telehealth: Payer: Self-pay | Admitting: *Deleted

## 2019-05-05 DIAGNOSIS — N898 Other specified noninflammatory disorders of vagina: Secondary | ICD-10-CM

## 2019-05-05 MED ORDER — CLINDAMYCIN HCL 300 MG PO CAPS: 300.0000 mg | ORAL_CAPSULE | Freq: Two times a day (BID) | ORAL | 0 refills | Status: DC

## 2019-05-05 NOTE — Telephone Encounter (Signed)
Pt left message requesting Rx for Clindamycin capsules to treat her vaginal discharge. She stated the symptoms are the same as what she had in the past. She is allergic to Flagyl and was given Clindamycin last year for same sx. Per Dr. Nehemiah Settle, Rx approved and e-prescribed to her pharmacy. Pt was notified and voiced understanding.

## 2019-06-02 ENCOUNTER — Other Ambulatory Visit: Payer: Self-pay | Admitting: Obstetrics and Gynecology

## 2019-06-02 ENCOUNTER — Other Ambulatory Visit: Payer: Self-pay

## 2019-06-02 ENCOUNTER — Ambulatory Visit (INDEPENDENT_AMBULATORY_CARE_PROVIDER_SITE_OTHER): Payer: Medicaid Other | Admitting: Obstetrics and Gynecology

## 2019-06-02 ENCOUNTER — Other Ambulatory Visit (HOSPITAL_COMMUNITY)
Admission: RE | Admit: 2019-06-02 | Discharge: 2019-06-02 | Disposition: A | Discharge status: Home / Self Care | Payer: Medicaid Other | Source: Ambulatory Visit | Attending: Obstetrics and Gynecology | Admitting: Obstetrics and Gynecology

## 2019-06-02 ENCOUNTER — Encounter: Payer: Self-pay | Admitting: Obstetrics and Gynecology

## 2019-06-02 VITALS — BP 123/89 | HR 71 | Temp 98.6°F | Ht 67.0 in | Wt 163.3 lb

## 2019-06-02 DIAGNOSIS — E282 Polycystic ovarian syndrome: Secondary | ICD-10-CM

## 2019-06-02 DIAGNOSIS — N898 Other specified noninflammatory disorders of vagina: Secondary | ICD-10-CM | POA: Diagnosis not present

## 2019-06-02 DIAGNOSIS — Z0000 Encounter for general adult medical examination without abnormal findings: Secondary | ICD-10-CM | POA: Diagnosis not present

## 2019-06-02 DIAGNOSIS — B9689 Other specified bacterial agents as the cause of diseases classified elsewhere: Secondary | ICD-10-CM

## 2019-06-02 DIAGNOSIS — Z01419 Encounter for gynecological examination (general) (routine) without abnormal findings: Secondary | ICD-10-CM

## 2019-06-02 DIAGNOSIS — N760 Acute vaginitis: Secondary | ICD-10-CM | POA: Insufficient documentation

## 2019-06-02 NOTE — Progress Notes (Signed)
Obstetrics and Gynecology Annual Patient Evaluation  Appointment Date: 06/02/2019  OBGYN Clinic: Center for [facility]  Primary Care Provider: Jackie Plum  Referring Provider: Jackie Plum, MD  Chief Complaint:  Chief Complaint  Patient presents with  . Gynecologic Exam    History of Present Illness: Jasmine Castaneda is a 38 y.o. African-American G1P0101 (Patient's last menstrual period was 06/02/2019.), seen for the above chief complaint.  Chronic BV: patient has discharge (yellow/white) with smell around the time of her period; pt states she has this with each period.   Prolonged interval with menses: period that started today had longer interval at 60 days than usual (see below). Pt is sexually active; didn't take home UPT  Patient denies any breast s/s.   Review of Systems:  as noted in the History of Present Illness.   Past Medical History:  Past Medical History:  Diagnosis Date  . Abnormal Pap smear    leep  . Asthma   . Hypertension   . Infertility   . PCOS (polycystic ovarian syndrome)     Past Surgical History:  Past Surgical History:  Procedure Laterality Date  . LEEP  2006    Past Obstetrical History:  OB History  Gravida Para Term Preterm AB Living  1 1   1  0 1  SAB TAB Ectopic Multiple Live Births  0 0 0 0 1    # Outcome Date GA Lbr Len/2nd Weight Sex Delivery Anes PTL Lv  1 Preterm 11/2004 [redacted]w[redacted]d   F Vag-Spont EPI       Birth Comments: Induced for pain/edema    Past Gynecological History: As per HPI. Periods: Usually 40-42 days, one week, has pain around time of period History of Pap Smear(s): Yes.   Last pap 2019, which was NILM/HPV negative and 2015, NILM/HPV negative She is currently using no method for contraception.   Social History:  Social History   Socioeconomic History  . Marital status: Married    Spouse name: Not on file  . Number of children: 1  . Years of education: Not on file  . Highest  education level: Not on file  Occupational History  . Occupation: Public librarian  Social Needs  . Financial resource strain: Not on file  . Food insecurity    Worry: Not on file    Inability: Not on file  . Transportation needs    Medical: Not on file    Non-medical: Not on file  Tobacco Use  . Smoking status: Never Smoker  . Smokeless tobacco: Never Used  Substance and Sexual Activity  . Alcohol use: Yes    Comment: social, rare  . Drug use: No  . Sexual activity: Yes    Birth control/protection: None  Lifestyle  . Physical activity    Days per week: Not on file    Minutes per session: Not on file  . Stress: Not on file  Relationships  . Social Musician on phone: Not on file    Gets together: Not on file    Attends religious service: Not on file    Active member of club or organization: Not on file    Attends meetings of clubs or organizations: Not on file    Relationship status: Not on file  . Intimate partner violence    Fear of current or ex partner: Not on file    Emotionally abused: Not on file    Physically abused: Not on file    Forced  sexual activity: Not on file  Other Topics Concern  . Not on file  Social History Narrative  . Not on file    Family History:  Family History  Problem Relation Age of Onset  . Hypertension Father   . Ovarian cancer Mother   . Ulcerative colitis Mother   . Cystic fibrosis Mother   . Kidney disease Maternal Uncle   . Irritable bowel syndrome Paternal Aunt   . Diabetes Maternal Grandmother   . Diabetes Maternal Grandfather   . Diabetes Paternal Grandmother   . Diabetes Paternal Grandfather    Medications [place] had no medications administered during this visit. Current Outpatient Medications  Medication Sig Dispense Refill  . diphenhydrAMINE (BENADRYL) 25 mg capsule Take 25 mg by mouth every 6 (six) hours as needed for allergies.    Marland Kitchen ibuprofen (ADVIL) 100 MG/5ML suspension Take 200 mg by mouth  every 4 (four) hours as needed.    . Multiple Vitamin (MULTIVITAMIN WITH MINERALS) TABS tablet Take 1 tablet by mouth daily.    . Probiotic Product (PROBIOTIC PO) Take 1 capsule by mouth daily.    . Aspirin-Salicylamide-Caffeine (BC HEADACHE POWDER PO) Take 2 packets by mouth daily as needed (for pain/headaches).     No current facility-administered medications for this visit.     Allergies Septra [bactrim] and Flagyl [metronidazole]   Physical Exam:  BP 123/89   Pulse 71   Temp 98.6 F (37 C)   Ht 5\' 7"  (1.702 m)   Wt 163 lb 4.8 oz (74.1 kg)   LMP 06/02/2019   BMI 25.58 kg/m  Body mass index is 25.58 kg/m. General appearance: Well nourished, well developed female in no acute distress.  Cardiovascular: normal s1 and s2.  No murmurs, rubs or gallops. Respiratory:  Clear to auscultation bilateral. Normal respiratory effort Abdomen: positive bowel sounds and no masses, hernias; diffusely non tender to palpation, non distended Neuro/Psych:  Normal mood and affect.  Skin:  Warm and dry.  Lymphatic:  No inguinal lymphadenopathy.   Pelvic exam: is not limited by body habitus EGBUS: within normal limits, Vagina: within normal limits and with scant old blood blood, no discharge in the vault, Cervix: normal appearing cervix without tenderness, discharge or lesions. Uterus:  nonenlarged and non tender and Adnexa:  normal adnexa and no mass, fullness, tenderness Rectovaginal: deferred  Laboratory: none  Radiology: reviewed  Assessment: pt stable  Plan:  *GYN: routine care. D/w her I don't think she needs rpt pap testing and she can do q3y which she is amenable to; pt would like STI blood and swab testing today. Pt not interested in [facility] *PCOS: d/w her that likely reason for her longer menses interval but if happens again in the future, recommend home UPT just to make sure. D/w her that normal period interval is 21-42 days if she is consistently above the 42 day mark to let [facility] know then  puts her at risk for hyperplasia/cancer and I recommend something for more regular period. Right now, most likely reason for cramping/discomfort around her period is likely due to increased length and can consider hormone management to help with this but if it's not impacting her ADLs then I don't think she has to; pt to do exp management. I also told her to check with her pcp to make sure her bp, dm screening, lipid screening are all fine as this could impact her periods and a 10% weight loss may also help with her period regularity. *Chronic  BV: states the boric acid in the past helped but it looks like she was only on this for a limited treatment course. D/w her that can try longer treatment course and to call us for a visit 6months after her course is to finish. Pt amenable to plan  Durene Romans MD Attending Center for [facility])

## 2019-06-03 LAB — CERVICOVAGINAL ANCILLARY ONLY
Bacterial Vaginitis (gardnerella): NEGATIVE
Candida Glabrata: NEGATIVE
Candida Vaginitis: NEGATIVE
Molecular Disclaimer: NEGATIVE
Molecular Disclaimer: NEGATIVE
Molecular Disclaimer: NEGATIVE
Molecular Disclaimer: NORMAL
Trichomonas: NEGATIVE

## 2019-06-03 LAB — HEPATITIS B SURFACE ANTIGEN: Hepatitis B Surface Ag: NEGATIVE

## 2019-06-03 LAB — HIV ANTIBODY (ROUTINE TESTING W REFLEX): HIV Screen 4th Generation wRfx: NONREACTIVE

## 2019-06-03 LAB — RPR: RPR Ser Ql: REACTIVE — AB

## 2019-06-03 LAB — HEPATITIS C ANTIBODY: Hep C Virus Ab: <0.1 s/co ratio (ref 0.0–0.9)

## 2019-06-03 LAB — RPR, QUANT+TP ABS (REFLEX)
Rapid Plasma Reagin, Quant: 1:1 {titer} — ABNORMAL HIGH
T Pallidum Abs: NONREACTIVE

## 2019-06-03 MED ORDER — FLUCONAZOLE 150 MG PO TABS: 150.0000 mg | ORAL_TABLET | ORAL | 0 refills | Status: AC

## 2019-06-03 MED ORDER — BORIC ACID CRYS: 600.0000 mg | CRYSTALS | 2 refills | Status: AC

## 2019-06-04 LAB — CERVICOVAGINAL ANCILLARY ONLY
Chlamydia: NEGATIVE
Neisseria Gonorrhea: NEGATIVE

## 2019-06-11 ENCOUNTER — Other Ambulatory Visit: Payer: Self-pay | Admitting: Obstetrics and Gynecology

## 2019-06-11 DIAGNOSIS — A530 Latent syphilis, unspecified as early or late: Secondary | ICD-10-CM

## 2019-06-11 HISTORY — DX: Latent syphilis, unspecified as early or late: A53.0

## 2019-08-18 ENCOUNTER — Telehealth (INDEPENDENT_AMBULATORY_CARE_PROVIDER_SITE_OTHER): Payer: Medicaid Other | Admitting: General Practice

## 2019-08-18 DIAGNOSIS — T3695XA Adverse effect of unspecified systemic antibiotic, initial encounter: Secondary | ICD-10-CM

## 2019-08-18 DIAGNOSIS — B379 Candidiasis, unspecified: Secondary | ICD-10-CM

## 2019-08-18 DIAGNOSIS — N898 Other specified noninflammatory disorders of vagina: Secondary | ICD-10-CM

## 2019-08-18 MED ORDER — FLUCONAZOLE 150 MG PO TABS: 150.0000 mg | ORAL_TABLET | Freq: Once | ORAL | 0 refills | Status: AC

## 2019-08-18 MED ORDER — CLINDAMYCIN HCL 300 MG PO CAPS: 300.0000 mg | ORAL_CAPSULE | Freq: Two times a day (BID) | ORAL | 0 refills | Status: DC

## 2019-08-18 NOTE — Telephone Encounter (Signed)
Patient called and left message on nurse voicemail line stating she needs a refill on her clindamycin and diflucan. She states she was supposed to have gotten boric acid previously but never did. [place] where Rx was sent and they state the Rx was never filled but they will get it ready for her. Clindamycin & Diflucan refilled. Called & informed patient and discussed boric acid Rx at different pharmacy. Discussed scheduling follow up appt with Dr Ilda Basset. Patient verbalized understanding to all & will await phone call from front office with an appt.

## 2019-09-08 ENCOUNTER — Ambulatory Visit: Payer: Medicaid Other | Admitting: Obstetrics and Gynecology

## 2019-09-08 ENCOUNTER — Other Ambulatory Visit: Payer: Self-pay

## 2019-09-10 ENCOUNTER — Ambulatory Visit: Payer: Medicaid Other | Admitting: Obstetrics and Gynecology

## 2019-09-29 ENCOUNTER — Other Ambulatory Visit: Payer: Self-pay

## 2019-09-29 ENCOUNTER — Encounter: Payer: Self-pay | Admitting: Obstetrics and Gynecology

## 2019-09-29 ENCOUNTER — Telehealth (INDEPENDENT_AMBULATORY_CARE_PROVIDER_SITE_OTHER): Payer: Medicaid Other | Admitting: Obstetrics and Gynecology

## 2019-09-29 VITALS — BP 130/93 | HR 71 | Wt 164.1 lb

## 2019-09-29 DIAGNOSIS — L709 Acne, unspecified: Secondary | ICD-10-CM

## 2019-09-29 DIAGNOSIS — N898 Other specified noninflammatory disorders of vagina: Secondary | ICD-10-CM | POA: Diagnosis not present

## 2019-09-29 DIAGNOSIS — N761 Subacute and chronic vaginitis: Secondary | ICD-10-CM | POA: Diagnosis not present

## 2019-09-29 DIAGNOSIS — L678 Other hair color and hair shaft abnormalities: Secondary | ICD-10-CM | POA: Diagnosis not present

## 2019-09-29 MED ORDER — FLUCONAZOLE 150 MG PO TABS: ORAL_TABLET | ORAL | 0 refills | Status: DC

## 2019-09-29 NOTE — Progress Notes (Signed)
Obstetrics and Gynecology Visit Return Patient Evaluation  Appointment Date: 09/29/2019  Primary Care Provider: Jackie Plum  OBGYN Clinic: [facility]  Chief Complaint: chronic vaginitis, facial hair  History of Present Illness:  Jasmine Castaneda is a 39 y.o. with above CC.  Vaginal irration: pt seen by me previously and has episodes that occurs 3-4x/year and she would like something stronger than diflucan. She never did the boric acid that was given last time.   Hormones: pt notes facial hair and acne and would like to have her "hormones checked" including her estrogen and progesterone.   Review of Systems: as noted in the History of Present Illness.   Patient Active Problem List   Diagnosis Date Noted  . 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 Recurrent BV (bacterial vaginosis) 06/02/2019  . Chronic pelvic pain in female 07/07/2013  . PCOS (polycystic ovarian syndrome) 04/19/2011   Medications:  Floris L. Luberto had no medications administered during this visit. Current Outpatient Medications  Medication Sig Dispense Refill  . diphenhydrAMINE (BENADRYL) 25 mg capsule Take 25 mg by mouth every 6 (six) hours as needed for allergies.    Marland Kitchen ibuprofen (ADVIL) 100 MG/5ML suspension Take 200 mg by mouth every 4 (four) hours as needed.    . Multiple Vitamin (MULTIVITAMIN WITH MINERALS) TABS tablet Take 1 tablet by mouth daily.    . Probiotic Product (PROBIOTIC PO) Take 1 capsule by mouth daily.    . Aspirin-Salicylamide-Caffeine (BC HEADACHE POWDER PO) Take 2 packets by mouth daily as needed (for pain/headaches).    . fluconazole (DIFLUCAN) 150 MG tablet One tab po x 1 and repeat in 3 days 4 tablet 0   No current facility-administered medications for this visit.    Allergies: is allergic to septra [bactrim] and flagyl [metronidazole].  Physical Exam:  BP (!) 130/93   Pulse 71   Wt 164 lb 1.6 oz (74.4 kg)   LMP 08/21/2019 (Exact Date)   BMI 25.70  kg/m  Body mass index is 25.7 kg/m. General appearance: Well nourished, well developed female in no acute distress.  Abdomen: diffusely non tender to palpation, non distended, and no masses, hernias Neuro/Psych:  Normal mood and affect.    Pelvic exam:  EGBUS, vaginal vault and cervix: within normal limits   Assessment: pt stable  Plan:  1. Chronic vaginitis I told her I recommend yeast culture. We don't have nuswab or culture tubes today so will have her come back for this. Never had pcos labs to confirm so will check that. Pt amenable to diflucan for now pending swab results.  - Hemoglobin A1c - TSH+Prl+TestT+TestF+17OHP - DHEA-sulfate - Estradiol - Progesterone - Beta hCG quant (ref lab)  2. Abnormal facial hair  3. Acne, unspecified acne type  4. Vaginal irritation   RTC: PRN  Cornelia Copa MD Attending Center for [facility])

## 2019-09-30 LAB — RPR: RPR Ser Ql: NONREACTIVE

## 2019-10-01 ENCOUNTER — Encounter: Payer: Self-pay | Admitting: Obstetrics and Gynecology

## 2019-10-05 LAB — HEMOGLOBIN A1C
Est. average glucose Bld gHb Est-mCnc: 88 mg/dL
Hgb A1c MFr Bld: 4.7 % — ABNORMAL LOW (ref 4.8–5.6)

## 2019-10-05 LAB — ESTRADIOL: Estradiol: 248 pg/mL

## 2019-10-05 LAB — PROGESTERONE: Progesterone: 14 ng/mL

## 2019-10-05 LAB — TSH+PRL+TESTT+TESTF+17OHP
17-Hydroxyprogesterone: 271 ng/dL
Prolactin: 16.9 ng/mL (ref 4.8–23.3)
TSH: 0.477 u[IU]/mL (ref 0.450–4.500)
Testosterone, Free: 0.6 pg/mL (ref 0.0–4.2)
Testosterone, Total, LC/MS: 45.2 ng/dL (ref 10.0–55.0)

## 2019-10-05 LAB — BETA HCG QUANT (REF LAB): hCG Quant: <1 m[IU]/mL

## 2019-10-05 LAB — DHEA-SULFATE: DHEA-SO4: 87.7 ug/dL (ref 57.3–279.2)

## 2019-10-22 ENCOUNTER — Telehealth: Payer: Self-pay | Admitting: *Deleted

## 2019-10-22 NOTE — Telephone Encounter (Signed)
Avonne called and left a voice mail message this am stating she wants to get a refill on her clindamycin.  Tesneem Dufrane,RN

## 2019-10-22 NOTE — Telephone Encounter (Signed)
I called Jasmine Castaneda and I asked what symptoms she is having that she needs the refill .she reports vaginal odor and discharge.  I informed her since she had recent refill I have to send the message to the provider for approval and then we will get back in touch with her once we hear from him. We also discussed she had a recent visit and a swab was done but not processed because it was wrong tube. She states Dr. Vergie Living was going to let her know when we had the correct tubes.  Eliu Batch,RN

## 2019-10-23 NOTE — Telephone Encounter (Addendum)
Pt left phone message @ [phone] today stating that her prescription for Clindamycin had not been called to pharmacy yet. I called pt back and explained that the prescription needs to be authorized by Dr. Vergie Living. I stated that I will send new message to Dr. Vergie Living with her request. I will call her later today with his response or speak to alternate provider if necessary. Pt voiced understanding.   2/15  1150  Pt was contacted by me on 2/12 and I discussed the response received from Dr. Vergie Living with her. His message stated that she could either schedule appt to come to office for self swab provided we had the correct swab or could receive Rx for Cleocin 2% vaginal cream. She stated that she wanted treatment for her sx now and does not want to wait to come to office for swab test next week. Also, pt does not want Cleocin vaginal cream as ordered by Dr. Vergie Living. She only wants the Clindamycin oral medication which she had in the past. Dr. Vergie Living was unavailable for consult @ the time of speaking w/pt. Per consult w/Dr. Macon Large, pt may have Clindamycin as requested and also may have fluconazole. Pt was advised that if her sx reoccur following this course of treatment, she should contact the office for nurse visit for self swab (Genital culture - order code [id number] on pink top swab). Rx sent to pt's pharmacy per Dr. Macon Large.

## 2019-10-23 NOTE — Telephone Encounter (Signed)
Can you talk to crystal to see if we have the right swab (see the swab order from her last visit) and if so, the patient can come in for a self swab visit for it or we can send her in Cleocin 2% one full applicator inserted intravaginally once daily before bedtime for 7 consecutive days.  Can you see what she wants to do and send in cleocin if that's what she elects? thanks

## 2019-10-24 ENCOUNTER — Other Ambulatory Visit: Payer: Self-pay | Admitting: Obstetrics & Gynecology

## 2019-10-24 DIAGNOSIS — N761 Subacute and chronic vaginitis: Secondary | ICD-10-CM

## 2019-10-24 MED ORDER — CLINDAMYCIN HCL 300 MG PO CAPS: 300.0000 mg | ORAL_CAPSULE | Freq: Two times a day (BID) | ORAL | 0 refills | Status: DC

## 2019-10-24 MED ORDER — FLUCONAZOLE 150 MG PO TABS: ORAL_TABLET | ORAL | 0 refills | Status: DC

## 2019-10-25 ENCOUNTER — Other Ambulatory Visit: Payer: Self-pay | Admitting: Obstetrics and Gynecology

## 2019-10-25 DIAGNOSIS — N761 Subacute and chronic vaginitis: Secondary | ICD-10-CM

## 2019-10-25 MED ORDER — CLINDAMYCIN HCL 300 MG PO CAPS: 300.0000 mg | ORAL_CAPSULE | Freq: Two times a day (BID) | ORAL | 0 refills | Status: DC

## 2019-10-27 ENCOUNTER — Encounter: Payer: Self-pay | Admitting: *Deleted

## 2019-12-16 ENCOUNTER — Encounter: Payer: Self-pay | Admitting: Advanced Practice Midwife

## 2019-12-16 ENCOUNTER — Other Ambulatory Visit (HOSPITAL_COMMUNITY)
Admission: RE | Admit: 2019-12-16 | Discharge: 2019-12-16 | Disposition: A | Discharge status: Home / Self Care | Payer: Medicaid Other | Source: Ambulatory Visit | Attending: Advanced Practice Midwife | Admitting: Advanced Practice Midwife

## 2019-12-16 ENCOUNTER — Other Ambulatory Visit: Payer: Self-pay

## 2019-12-16 ENCOUNTER — Ambulatory Visit (INDEPENDENT_AMBULATORY_CARE_PROVIDER_SITE_OTHER): Payer: Medicaid Other | Admitting: Advanced Practice Midwife

## 2019-12-16 VITALS — BP 134/95 | HR 80 | Ht 68.0 in | Wt 170.2 lb

## 2019-12-16 DIAGNOSIS — N898 Other specified noninflammatory disorders of vagina: Secondary | ICD-10-CM | POA: Insufficient documentation

## 2019-12-16 DIAGNOSIS — Z113 Encounter for screening for infections with a predominantly sexual mode of transmission: Secondary | ICD-10-CM

## 2019-12-16 MED ORDER — BORIC ACID CRYS: 600.0000 mg | CRYSTALS | Freq: Every day | 11 refills | Status: AC

## 2019-12-16 MED ORDER — CLINDAMYCIN HCL 300 MG PO CAPS: 300.0000 mg | ORAL_CAPSULE | Freq: Two times a day (BID) | ORAL | 11 refills | Status: DC

## 2019-12-16 NOTE — Progress Notes (Signed)
  Subjective:     Patient ID: Jasmine Castaneda, female   DOB: 04-29-1981, 39 y.o.   MRN: [id number]  Jasmine Castaneda is a 39 y.o. G1P0101 who is here today with BV symptoms. She states that she get BV symptoms after each cycle. She has been seen for this several times and would like to get prescriptions with refills so she does not have to come to be seen every time.   Vaginal Discharge The patient's primary symptoms include a genital odor and vaginal discharge. This is a new problem. The current episode started in the past 7 days. The problem occurs constantly. The problem has been gradually worsening. The vaginal discharge was thick, white and malodorous. There has been no bleeding. The symptoms are aggravated by intercourse (mentrual cycle ). She is sexually active.     Review of Systems  Genitourinary: Positive for vaginal discharge.  All other systems reviewed and are negative.      Objective:   Physical Exam Vitals and nursing note reviewed.  Constitutional:      Appearance: Normal appearance.  HENT:     Head: Normocephalic.  Cardiovascular:     Rate and Rhythm: Normal rate.  Pulmonary:     Effort: Pulmonary effort is normal.  Neurological:     General: No focal deficit present.     Mental Status: She is alert and oriented to person, place, and time.  Psychiatric:        Mood and Affect: Mood normal.        Assessment:     1. Screening for STD (sexually transmitted disease)   2. Vaginal irritation       Plan:     Clindamycin 300MG  BID x 7 days with 11  RFs Boric Acid 600mg  vaginally each day x 14 days then 2-3 times per week. 30 caps with 11 RFs  DNP, CNM  12/16/19  4:09 PM

## 2019-12-17 LAB — CERVICOVAGINAL ANCILLARY ONLY
Chlamydia: NEGATIVE
Comment: NEGATIVE
Comment: NEGATIVE
Comment: NORMAL
Neisseria Gonorrhea: NEGATIVE
Trichomonas: NEGATIVE

## 2019-12-18 ENCOUNTER — Telehealth: Payer: Self-pay | Admitting: Clinical

## 2019-12-18 NOTE — Telephone Encounter (Signed)
Follow up call to patient completed.

## 2020-09-02 ENCOUNTER — Telehealth: Payer: Self-pay | Admitting: Family Medicine

## 2020-09-02 DIAGNOSIS — N761 Subacute and chronic vaginitis: Secondary | ICD-10-CM

## 2020-09-02 MED ORDER — FLUCONAZOLE 150 MG PO TABS: ORAL_TABLET | ORAL | 0 refills | Status: DC

## 2020-09-02 NOTE — Telephone Encounter (Signed)
Pt states that she needs a refill on medication and pharmacy has also called per pt. States to call her ASAP to see what she needs to do. thanks

## 2020-09-02 NOTE — Telephone Encounter (Signed)
Returned patients call. She reports she needs Fluconazole refilled. She reports she has a sever yeast infection. She has her cycle and reports it happens every month. She reports she has itching and burning. She reports she gets monthly and is supposed to have a prescription for monthly Fluconazole. Per chart review, was not recommended or ordered at last appointment. Will reach out to Thressa Sheller for clarification.

## 2020-09-05 ENCOUNTER — Other Ambulatory Visit: Payer: Self-pay | Admitting: Advanced Practice Midwife

## 2020-09-05 DIAGNOSIS — N761 Subacute and chronic vaginitis: Secondary | ICD-10-CM

## 2020-09-05 MED ORDER — FLUCONAZOLE 150 MG PO TABS: ORAL_TABLET | ORAL | 3 refills | Status: DC

## 2020-10-05 ENCOUNTER — Ambulatory Visit (INDEPENDENT_AMBULATORY_CARE_PROVIDER_SITE_OTHER): Payer: Medicaid Other | Admitting: Family Medicine

## 2020-10-05 ENCOUNTER — Encounter: Payer: Self-pay | Admitting: Family Medicine

## 2020-10-05 ENCOUNTER — Other Ambulatory Visit: Payer: Self-pay

## 2020-10-05 ENCOUNTER — Other Ambulatory Visit (HOSPITAL_COMMUNITY)
Admission: RE | Admit: 2020-10-05 | Discharge: 2020-10-05 | Disposition: A | Discharge status: Home / Self Care | Payer: Medicaid Other | Source: Ambulatory Visit | Attending: Family Medicine | Admitting: Family Medicine

## 2020-10-05 VITALS — BP 151/106 | HR 77 | Ht 68.0 in | Wt 180.1 lb

## 2020-10-05 DIAGNOSIS — Z809 Family history of malignant neoplasm, unspecified: Secondary | ICD-10-CM

## 2020-10-05 DIAGNOSIS — Z113 Encounter for screening for infections with a predominantly sexual mode of transmission: Secondary | ICD-10-CM | POA: Diagnosis not present

## 2020-10-05 DIAGNOSIS — Z01419 Encounter for gynecological examination (general) (routine) without abnormal findings: Secondary | ICD-10-CM

## 2020-10-05 DIAGNOSIS — N760 Acute vaginitis: Secondary | ICD-10-CM | POA: Diagnosis not present

## 2020-10-05 MED ORDER — BORIC ACID POWD: 2 refills | Status: DC

## 2020-10-05 NOTE — Progress Notes (Signed)
GYNECOLOGY ANNUAL PREVENTATIVE CARE ENCOUNTER NOTE  History:     Jasmine Castaneda is a 40 y.o. G56P0101 female here for a routine annual gynecologic exam.  Current complaints: recurrent BV.   Denies abnormal vaginal bleeding, discharge, pelvic pain, problems with intercourse or other gynecologic concerns.      Recurrent BV -patient states she has both a yeast infection and BV with every menstrual cycle -also related to intercourse -has used boric acid and clindamycin with relief of symptoms -has vaginal irritation with metrogel and does not tolerate PO flagyl -1 sexual partner -no douching or change in soaps, body washes, detergents  Gynecologic History Patient's last menstrual period was 10/05/2020 (exact date). Contraception: none Last Pap: 01/29/2018. Results were: normal with negative HPV Last mammogram: not indicated given age and lack of family history Family history of uterine and ovarian cancer  Obstetric History OB History  Gravida Para Term Preterm AB Living  1 1   1  0 1  SAB IAB Ectopic Multiple Live Births  0 0 0 0 1    # Outcome Date GA Lbr Len/2nd Weight Sex Delivery Anes PTL Lv  1 Preterm 11/2004 [redacted]w[redacted]d  F Vag-Spont EPI       Birth Comments: Induced for pain/edema    Past Medical History:  Diagnosis Date  . Abnormal Pap smear    leep  . Asthma   . Hypertension   . Infertility   . PCOS (polycystic ovarian syndrome)   . 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 LEEP  2006    Current Outpatient Medications on File Prior to Visit  Medication Sig Dispense Refill  . Aspirin-Salicylamide-Caffeine (BC HEADACHE POWDER PO) Take 2 packets by mouth daily as needed (for pain/headaches).    . clindamycin (CLEOCIN) 300 MG capsule Take 1 capsule (300 mg total) by mouth in the morning and at bedtime. 14 capsule 11  . diphenhydrAMINE (BENADRYL) 25 mg capsule Take 25 mg by mouth every 6 (six)  hours as needed for allergies.    . fluconazole (DIFLUCAN) 150 MG tablet One tab po x 1 and repeat in 3 days if still having symptoms. 4 tablet 3  . ibuprofen (ADVIL) 100 MG/5ML suspension Take 200 mg by mouth every 4 (four) hours as needed.    . Multiple Vitamin (MULTIVITAMIN WITH MINERALS) TABS tablet Take 1 tablet by mouth daily.    . Probiotic Product (PROBIOTIC PO) Take 1 capsule by mouth daily.     No current facility-administered medications on file prior to visit.    Allergies  Allergen Reactions  . Septra [Bactrim] Anaphylaxis  . Flagyl [Metronidazole] Nausea And Vomiting and Rash    Severe yeast infection    Social History:  reports that she has never smoked. She has never used smokeless tobacco. She reports current alcohol use. She reports that she does not use drugs.  Family History  Problem Relation Age of Onset  . Hypertension Father   . Ovarian cancer Mother   . Ulcerative colitis Mother   . Cystic fibrosis Mother   . Kidney disease Maternal Uncle   . Irritable bowel syndrome Paternal Aunt   . Diabetes Maternal Grandmother   . Diabetes Maternal Grandfather   . Diabetes Paternal Grandmother   . Diabetes Paternal Grandfather     The following portions of the patient's history were reviewed and updated as appropriate: allergies, current medications, past family  history, past medical history, past social history, past surgical history and problem list.  Review of Systems Pertinent items noted in HPI and remainder of comprehensive ROS otherwise negative.  Physical Exam:  BP (!) 151/106 (BP Location: Right Arm)   Pulse 77   Ht 5' 8"  (1.727 m)   Wt 180 lb 1.6 oz (81.7 kg)   LMP 10/05/2020 (Exact Date)   BMI 27.38 kg/m  CONSTITUTIONAL: Well-developed, well-nourished female in no acute distress.  HENT:  Normocephalic, atraumatic, External right and left ear normal. Oropharynx is clear and moist EYES: Conjunctivae and EOM are normal. Pupils are equal, round. No  scleral icterus.  NECK: Normal range of motion, supple, no masses.  SKIN: Skin is warm and dry. No rash noted. Not diaphoretic. No erythema. No pallor. MUSCULOSKELETAL: Normal range of motion. No tenderness.  No cyanosis, clubbing, or edema.  NEUROLOGIC: Alert and oriented to person, place, and time. Normal reflexes, muscle tone coordination.  PSYCHIATRIC: Normal mood and affect. Normal behavior. Normal judgment and thought content. CARDIOVASCULAR: Normal heart rate noted, regular rhythm RESPIRATORY: Effort and breath sounds normal, no problems with respiration noted. BREASTS: Symmetric in size. No masses, tenderness, skin changes, nipple drainage, or lymphadenopathy bilaterally. Performed in the presence of a chaperone. ABDOMEN: Soft, no distention noted.  No tenderness, rebound or guarding.  PELVIC: deferred   Assessment and Plan:      Zeidy was seen today for gynecologic exam.  Diagnoses and all orders for this visit:  Screening for STD (sexually transmitted disease) 1 sexual partner, female, no contraception. Currently on menstrual cycle, no UPT performed. Patient desires STI testing. -     Cancel: Cytology - PAP( [place]) -     Hepatitis B Surface AntiGEN -     Hepatitis C Antibody -     HIV Antibody (routine testing w rflx) -     RPR -     Cervicovaginal ancillary only( [place]) -     WET PREP FOR [place], CLUE  Well woman exam with routine gynecological exam Last pap nml 01/2018, given age and ASCCP guidelines, due 01/2023.  Recurrent vaginitis Multiple episodes of BV and yeast, improved with diflucan and clindamycin. Discussed a prolonged course of boric acid may be beneficial to decrease episode frequency. Follow up in 6 months if symptoms not improved. -     Boric Acid POWD; Use 1 capsule per vagina at bedtime daily for 1 weeks, then 1 capsule per vagina at bedtime two-three times a week for 6 months.  Family history of cancer Patient has significant family  history of uterine and ovarian cancer, no family history of breast cancer. Will refer to genetics for further discussion of BRCA gene mutation. -     Ambulatory referral to Genetics  Routine preventative health maintenance measures emphasized. Please refer to After Visit Summary for other counseling recommendations.      Arrie Senate, MD OB Fellow, [place] [facility] [place] 10/06/2020 10:34 AM

## 2020-10-06 LAB — CERVICOVAGINAL ANCILLARY ONLY
Bacterial Vaginitis (gardnerella): POSITIVE — AB
Candida Glabrata: NEGATIVE
Candida Vaginitis: NEGATIVE
Chlamydia: NEGATIVE
Comment: NEGATIVE
Comment: NEGATIVE
Comment: NEGATIVE
Comment: NEGATIVE
Comment: NEGATIVE
Comment: NORMAL
Neisseria Gonorrhea: NEGATIVE
Trichomonas: NEGATIVE

## 2020-10-06 LAB — HEPATITIS B SURFACE ANTIGEN: Hepatitis B Surface Ag: NEGATIVE

## 2020-10-06 LAB — RPR: RPR Ser Ql: NONREACTIVE

## 2020-10-06 LAB — HEPATITIS C ANTIBODY: Hep C Virus Ab: 0.1 s/co ratio (ref 0.0–0.9)

## 2020-10-06 LAB — HIV ANTIBODY (ROUTINE TESTING W REFLEX): HIV Screen 4th Generation wRfx: NONREACTIVE

## 2020-10-15 ENCOUNTER — Telehealth: Payer: Self-pay | Admitting: Genetic Counselor

## 2020-10-15 NOTE — Telephone Encounter (Signed)
Received a genetic counseling referral for [facility]ca. Jasmine Castaneda has been cld and scheduled for a virtual visit on 2/15 at 11am. I verified the pt has an active mychart acct.

## 2020-10-26 ENCOUNTER — Inpatient Hospital Stay: Payer: Medicaid Other | Attending: Genetic Counselor | Admitting: Genetic Counselor

## 2020-10-26 DIAGNOSIS — Z803 Family history of malignant neoplasm of breast: Secondary | ICD-10-CM

## 2020-10-26 DIAGNOSIS — Z8041 Family history of malignant neoplasm of ovary: Secondary | ICD-10-CM | POA: Diagnosis not present

## 2020-10-26 DIAGNOSIS — Z8042 Family history of malignant neoplasm of prostate: Secondary | ICD-10-CM

## 2020-10-26 DIAGNOSIS — Z8049 Family history of malignant neoplasm of other genital organs: Secondary | ICD-10-CM

## 2020-10-26 DIAGNOSIS — Z808 Family history of malignant neoplasm of other organs or systems: Secondary | ICD-10-CM

## 2020-10-26 DIAGNOSIS — Z800 Family history of malignant neoplasm of digestive organs: Secondary | ICD-10-CM

## 2020-10-27 ENCOUNTER — Encounter: Payer: Self-pay | Admitting: Genetic Counselor

## 2020-10-27 DIAGNOSIS — Z800 Family history of malignant neoplasm of digestive organs: Secondary | ICD-10-CM | POA: Insufficient documentation

## 2020-10-27 DIAGNOSIS — Z808 Family history of malignant neoplasm of other organs or systems: Secondary | ICD-10-CM | POA: Insufficient documentation

## 2020-10-27 DIAGNOSIS — Z8041 Family history of malignant neoplasm of ovary: Secondary | ICD-10-CM | POA: Insufficient documentation

## 2020-10-27 DIAGNOSIS — Z8049 Family history of malignant neoplasm of other genital organs: Secondary | ICD-10-CM | POA: Insufficient documentation

## 2020-10-27 DIAGNOSIS — Z8042 Family history of malignant neoplasm of prostate: Secondary | ICD-10-CM | POA: Insufficient documentation

## 2020-10-27 DIAGNOSIS — Z803 Family history of malignant neoplasm of breast: Secondary | ICD-10-CM | POA: Insufficient documentation

## 2020-10-27 NOTE — Progress Notes (Signed)
REFERRING PROVIDER: Arrie Senate, MD [address] [place],  [place] [address]  PRIMARY PROVIDER:  Benito Mccreedy, MD  PRIMARY REASON FOR VISIT:  1. Family history of ovarian cancer   2. Family history of uterine cancer   3. Family history of breast cancer   4. Family history of cancer of pituitary gland and craniopharyngeal duct   5. Family history of colon cancer   6. Family history of prostate cancer      I connected with Jasmine Castaneda on 10/26/2020 at 11:00 am EDT by video conference and verified that I am speaking with the correct person using two identifiers.   Patient location: Car (not driving) Provider location: [place]:   Jasmine Castaneda, a 40 y.o. female, was seen for a [place] cancer genetics consultation at the request of Dr. Sylvester Harder due to a family history of cancer.  Jasmine Castaneda presents to clinic today to discuss the possibility of a hereditary predisposition to cancer, genetic testing, and to further clarify her future cancer risks, as well as potential cancer risks for family members.   Jasmine Castaneda does not have a personal history of cancer.    RISK FACTORS:  Menarche was at age 38-15.  First live birth at age 60.  OCP use for approximately 3 years.  Ovaries intact: yes.  Hysterectomy: no.  Menopausal status: premenopausal.  HRT use: 0 years (progesterone taken for PCOS). Colonoscopy: no; not examined. Mammogram within the last year: no. Number of breast biopsies: 0. Any excessive radiation exposure in the past: no.   Past Medical History:  Diagnosis Date  . Abnormal Pap smear    leep  . Asthma   . Family history of breast cancer   . Family history of cancer of pituitary gland and craniopharyngeal duct   . Family history of colon cancer   . Family history of ovarian cancer   . Family history of prostate cancer   . Family history of uterine cancer   . Hypertension   .  Infertility   . PCOS (polycystic ovarian syndrome)   . 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 LEEP  2006    Social History   Socioeconomic History  . Marital status: Married    Spouse name: Not on file  . Number of children: 1  . Years of education: Not on file  . Highest education level: Not on file  Occupational History  . Occupation: Stylist  Tobacco Use  . Smoking status: Never Smoker  . Smokeless tobacco: Never Used  Vaping Use  . Vaping Use: Never used  Substance and Sexual Activity  . Alcohol use: Yes    Comment: social, rare  . Drug use: No  . Sexual activity: Yes    Birth control/protection: None  Other Topics Concern  . Not on file  Social History Narrative  . Not on file   Social Determinants of Health   Financial Resource Strain: Not on file  Food Insecurity: Not on file  Transportation Needs: Not on file  Physical Activity: Not on file  Stress: Not on file  Social Connections: Not on file     FAMILY HISTORY:  We obtained a detailed, 4-generation family history.  Significant diagnoses are listed below: Family History  Problem Relation Age of Onset  . Hypertension Father   . Ovarian cancer Mother  dx early 79s  . Ulcerative colitis Mother   . Cystic fibrosis Mother   . Uterine cancer Mother        dx early 68s  . Breast cancer Mother        dx 67s  . Cancer Mother        pituitary gland cancer, dx 82s  . Kidney disease Maternal Uncle   . Irritable bowel syndrome Paternal Aunt   . Diabetes Maternal Grandmother   . Diabetes Maternal Grandfather   . Prostate cancer Maternal Grandfather 85  . Diabetes Paternal Grandmother   . Diabetes Paternal Grandfather   . Cancer Paternal Grandfather        unknown type, d.60s/70s  . Ovarian cancer Other        MGGM's mother  . Colon cancer Maternal Uncle        dx 37s  . Prostate cancer Maternal Uncle        dx 22s  .  Ovarian cancer Maternal Great-grandmother        MGM's mother  . Ovarian cancer Other        MGGM's sisters x3   Jasmine Castaneda has one daughter (age 40). She has three paternal half-brothers and one paternal half-sister. None of these relatives have had cancer.  Jasmine Castaneda mother is alive at age 96 and has had multiple cancers - ovarian and uterine cancer in her 72s, breast cancer in her late 21s, and pituitary gland cancer in her 35s. There were two maternal aunts and four maternal uncles. One uncle was diagnosed with colon cancer in his 88s. A second uncle was diagnosed with prostate cancer in his 88s. There is no known cancer among maternal cousins. Jasmine Castaneda maternal grandfather is alive at age 13 with metastatic prostate cancer, first diagnosed at age 77. Jasmine Castaneda notes that her maternal grandmother's mother, three aunts, and grandmother all had ovarian cancer.   Jasmine Castaneda father is alive at age 93 without cancer. There is one paternal aunt and three paternal uncles. There is no known cancer among paternal aunts/uncles or paternal cousins. Jasmine Castaneda paternal grandfather died in his 64s or 44s from an unknown cancer.   Jasmine Castaneda is unaware of previous family history of genetic testing for hereditary cancer risks. Patient's maternal ancestors are of Native American and Black/African American descent, and paternal ancestors are of Native [place] and [place] descent. There is reported Jewish ancestry on the paternal side, although she does not know if this is Ashkenazi Jewish ancestry. There is no known consanguinity.  GENETIC COUNSELING ASSESSMENT: Jasmine Castaneda is a 40 y.o. female with a family history of ovarian cancer, uterine cancer, breast cancer, colon cancer, prostate cancer, and pituitary gland cancer, which is somewhat suggestive of a hereditary cancer syndrome and predisposition to cancer. We, therefore, discussed and recommended the  following at today's visit.   DISCUSSION: We discussed that approximately 5-10% of cancer is hereditary, meaning that it is due to a mutation in a single gene that is passed down from generation to generation in a family. Most cases of hereditary breast, ovarian, and prostate cancers are associated with the BRCA1 and BRCA2 genes, whereas most hereditary cases of uterine and colon cancer are associated with Lynch syndrome. There are other genes that can be associated with the cancers in Jasmine Castaneda family, including but not limited to ATM, CHEK2, PALB2, PTEN, BRIP1, RAD51C, RAD51D, etc. We discussed that testing is beneficial for several reasons, including knowing about other cancer risks,  identifying potential screening and risk-reduction options that may be appropriate, and to understand if other family members could be at risk for cancer and allow them to undergo genetic testing.   We reviewed the characteristics, features and inheritance patterns of hereditary cancer syndromes. We also discussed genetic testing, including the appropriate family members to test, the process of testing, insurance coverage and turn-around-time for results. We discussed the implications of a negative, positive and/or variant of uncertain significant result. We recommended Jasmine Castaneda pursue genetic testing for the [employer]Next-Cancer + RNAinsight gene panel.   Genes Analyzed (91 total): AIP, ALK, APC, ATM, AXIN2, BAP1, BARD1, BLM, BMPR1A, BRCA1, BRCA2, BRIP1, CDC73, CDH1, CDK4, CDKN1B, CDKN2A, CHEK2, CTNNA1, DICER1, FANCC, FH, FLCN, GALNT12, KIF1B, LZTR1, MAX, MEN1, MET, MLH1, MRE11A, MSH2, MSH3, MSH6, MUTYH, NBN, NF1, NF2, NTHL1, PALB2, PHOX2B, PMS2, POT1, PRKAR1A, PTCH1, PTEN, RAD50, RAD51C, RAD51D, RB1, RECQL, RET, SDHA, SDHAF2, SDHB, SDHC, SDHD, SMAD4, SMARCA4, SMARCB1, SMARCE1, STK11, SUFU, TMEM127, Tp53, TSC1, TSC2, VHL and XRCC2 (sequencing and deletion/duplication); CASR, CFTR, CPA1, CTRC, EGFR, EGLN1,  FAM175A, HOXB13, KIT, MITF, MLH3, PALLD, PDGFRA, POLD1, POLE, PRSS1, RINT1, RPS20, SPINK1 and TERT (sequencing only); EPCAM and GREM1 (deletion/duplication only). RNA data is routinely analyzed for use in variant interpretation for all genes.   Based on Jasmine Castaneda family history of cancer, she meets medical criteria for genetic testing. Despite that she meets criteria, there may still be an out of pocket cost. We discussed that if her out of pocket cost for testing is over $100, the laboratory will reach out to let her know. If the out of pocket cost of testing is less than $100 she will be billed by the genetic testing laboratory.   PLAN: After considering the risks, benefits, and limitations, Jasmine Castaneda provided informed consent to pursue genetic testing and the blood sample will be sent to [facility] for analysis of the CustomNext-Cancer + RNAinsight panel. Results should be available within approximately two-three weeks' time, at which point they will be disclosed by telephone to Jasmine Castaneda, as will any additional recommendations warranted by these results. Jasmine Castaneda. Johnsen will receive a summary of her genetic counseling visit and a copy of her results once available. This information will also be available in Epic.   Jasmine Castaneda. Demonte questions were answered to her satisfaction today. Our contact information was provided should additional questions or concerns arise. Thank you for the referral and allowing [place] to share in the care of your patient.   Clint Guy, [place], [facility] Licensed, Certified Dispensing [email or web address] Phone: [phone]  The patient was seen for a total of 40 minutes in face-to-face genetic counseling.  This patient was discussed with Drs. Magrinat, Lindi Adie and/or Burr Medico who agrees with the above.    _______________________________________________________________________ For Office Staff:  Number of people involved in  session: 2 Was an Intern/ student involved with case: yes

## 2020-11-03 ENCOUNTER — Inpatient Hospital Stay: Payer: Medicaid Other

## 2020-11-12 ENCOUNTER — Encounter: Payer: Self-pay | Admitting: Genetic Counselor

## 2020-11-23 ENCOUNTER — Inpatient Hospital Stay: Payer: Medicaid Other | Attending: Genetic Counselor

## 2021-02-08 ENCOUNTER — Other Ambulatory Visit: Payer: Self-pay

## 2021-02-08 DIAGNOSIS — B9689 Other specified bacterial agents as the cause of diseases classified elsewhere: Secondary | ICD-10-CM

## 2021-02-08 DIAGNOSIS — N760 Acute vaginitis: Secondary | ICD-10-CM

## 2021-02-08 MED ORDER — CLINDAMYCIN HCL 300 MG PO CAPS: 300.0000 mg | ORAL_CAPSULE | Freq: Two times a day (BID) | ORAL | 11 refills | Status: DC

## 2021-06-02 ENCOUNTER — Ambulatory Visit: Payer: Medicaid Other | Admitting: Neurology

## 2021-08-03 ENCOUNTER — Encounter: Payer: Self-pay | Admitting: *Deleted

## 2021-08-08 ENCOUNTER — Ambulatory Visit: Payer: Medicaid Other | Admitting: Neurology

## 2021-08-08 ENCOUNTER — Encounter: Payer: Self-pay | Admitting: Neurology

## 2022-05-18 ENCOUNTER — Telehealth: Payer: Self-pay | Admitting: Family Medicine

## 2022-05-18 DIAGNOSIS — B9689 Other specified bacterial agents as the cause of diseases classified elsewhere: Secondary | ICD-10-CM

## 2022-05-18 NOTE — Telephone Encounter (Signed)
Patient called in stating she needs a medication refill and wants to change the type of medication that she is taking.

## 2022-05-23 MED ORDER — CLINDAMYCIN HCL 300 MG PO CAPS: 300.0000 mg | ORAL_CAPSULE | Freq: Two times a day (BID) | ORAL | 1 refills | Status: DC

## 2022-05-23 MED ORDER — CLINDAMYCIN PHOSPHATE 100 MG VA SUPP: 100.0000 mg | Freq: Every day | VAGINAL | 1 refills | Status: DC

## 2022-05-23 NOTE — Telephone Encounter (Signed)
Called pt to follow up. Pt report she is out of clindamycin that she takes monthly following menstrual period for treatment of recurring BV. Pt states she does not take Flagyl due to allergy. Desires a different antibiotic due to concern that she will become resistant to clindamycin. Reviewed with Crissie Reese, MD who states there is not another approved treatment. Offers Clindamycin vaginal suppository. Pt states she will trial vaginal route. Requests prescription refill for PO and vaginal so that she can try one this month and another route next month. Plans to follow up for annual visit next available appt. Front office to contact pt when schedule is open for scheduling.

## 2023-09-27 ENCOUNTER — Other Ambulatory Visit: Payer: Self-pay

## 2023-09-27 ENCOUNTER — Ambulatory Visit: Payer: Medicaid Other | Admitting: Advanced Practice Midwife

## 2023-09-27 ENCOUNTER — Encounter: Payer: Self-pay | Admitting: Advanced Practice Midwife

## 2023-09-27 ENCOUNTER — Other Ambulatory Visit (HOSPITAL_COMMUNITY)
Admission: RE | Admit: 2023-09-27 | Discharge: 2023-09-27 | Disposition: A | Discharge status: Home / Self Care | Payer: Medicaid Other | Source: Ambulatory Visit | Attending: Advanced Practice Midwife | Admitting: Advanced Practice Midwife

## 2023-09-27 VITALS — BP 164/110 | HR 82 | Ht 68.0 in | Wt 189.4 lb

## 2023-09-27 DIAGNOSIS — Z124 Encounter for screening for malignant neoplasm of cervix: Secondary | ICD-10-CM

## 2023-09-27 DIAGNOSIS — Z3009 Encounter for other general counseling and advice on contraception: Secondary | ICD-10-CM

## 2023-09-27 DIAGNOSIS — N761 Subacute and chronic vaginitis: Secondary | ICD-10-CM

## 2023-09-27 DIAGNOSIS — R6882 Decreased libido: Secondary | ICD-10-CM

## 2023-09-27 DIAGNOSIS — Z0000 Encounter for general adult medical examination without abnormal findings: Secondary | ICD-10-CM | POA: Diagnosis present

## 2023-09-27 DIAGNOSIS — Z01411 Encounter for gynecological examination (general) (routine) with abnormal findings: Secondary | ICD-10-CM | POA: Diagnosis not present

## 2023-09-27 DIAGNOSIS — N760 Acute vaginitis: Secondary | ICD-10-CM | POA: Diagnosis not present

## 2023-09-27 DIAGNOSIS — Z113 Encounter for screening for infections with a predominantly sexual mode of transmission: Secondary | ICD-10-CM

## 2023-09-27 DIAGNOSIS — Z1231 Encounter for screening mammogram for malignant neoplasm of breast: Secondary | ICD-10-CM

## 2023-09-27 DIAGNOSIS — B3731 Acute candidiasis of vulva and vagina: Secondary | ICD-10-CM

## 2023-09-27 DIAGNOSIS — B9689 Other specified bacterial agents as the cause of diseases classified elsewhere: Secondary | ICD-10-CM

## 2023-09-27 DIAGNOSIS — Z1151 Encounter for screening for human papillomavirus (HPV): Secondary | ICD-10-CM

## 2023-09-27 MED ORDER — ESTRADIOL 0.1 MG/GM VA CREA: 1.0000 | TOPICAL_CREAM | Freq: Every day | VAGINAL | 2 refills | Status: DC

## 2023-09-27 MED ORDER — FLUCONAZOLE 150 MG PO TABS: ORAL_TABLET | ORAL | 3 refills | Status: DC

## 2023-09-27 MED ORDER — CLINDAMYCIN HCL 300 MG PO CAPS: 300.0000 mg | ORAL_CAPSULE | Freq: Two times a day (BID) | ORAL | 3 refills | Status: AC

## 2023-09-27 NOTE — Progress Notes (Signed)
History of Present Illness The patient, with a history of PCOS and LEEP procedure in 2005, presents for an annual gynecology exam. She expresses concerns about several symptoms including hair thinning, low libido, sleep disturbances, weight gain, and vaginal dryness that she thinks may be related to PCOS. The patient has noticed a significant decrease in libido and hair thinning around the hairline. She reports difficulty sleeping, often waking up after a few minutes of sleep. The patient has experienced occasional night sweats but attributes these to PCOS. She has also noticed an increase in body weight which is unusual for her. The patient reports vaginal dryness and pain during intercourse, which she believes may be due to infrequent sexual activity due to low libido. The patient also mentions a history of bacterial vaginosis and yeast infections. The patient's menstrual cycle has been irregular with a late cycle in December and a normal cycle in the following month.   Upstream - 10/01/23 2224       Pregnancy Intention Screening   Does the patient want to become pregnant in the next year? No    Does the patient's partner want to become pregnant in the next year? No    Would the patient like to discuss contraceptive options today? Yes      Contraception Wrap Up   Current Method No Contraceptive Precautions    End Method IUD or IUS    Contraception Counseling Provided Yes    How was the end contraceptive method provided? --   Appt scheduled           The pregnancy intention screening data noted above was reviewed. Potential methods of contraception were discussed. The patient elected to proceed with IUD or IUS.   Physical Exam BP (!) 164/110   Pulse 82   Ht 5\' 8"  (1.727 m)   Wt 189 lb 6.4 oz (85.9 kg)   LMP 09/22/2023 (Exact Date)   BMI 28.80 kg/m   NECK: No supraclavicular lymphadenopathy. BREAST: No lumps, bumps, nipple discharge, bleeding, puckering, dimpling, or abnormal skin  changes. ABDOMEN: No abdominal pain or TTP. GENITOURINARY: NEFG. Vaginal walls slightly pale. Physiologic discharge. no obvious ovarian cysts palpated, uterus anteverted without enlargement. No CVAT.  Scant brown blood. Pt reports being on her period.   Procedure: Pap smear Description: The speculum was inserted into the vagina. The cervix was visualized. A cytobrush was used to collect cells from the cervix.   Assessment and Plan Perimenopausal Symptoms Reports of hair thinning, low libido, sleep disturbances, and occasional night sweats. Discussed the possibility of these symptoms being related to perimenopause and/or PCOS. -Consider vaginal estrogen cream for vaginal dryness and atrophy. Pt initially resistant due to issues with recurrent BV and yeast. Will consider. Rx sent. -Consider referral to a provider specializing in hormone management for further evaluation and management.  Polycystic Ovary Syndrome (PCOS) History of PCOS with recent weight gain and hirsutism. Discussed the possibility of these symptoms being related to PCOS and/or perimenopause. -Continue current management and consider referral to a provider specializing in hormone management for further evaluation and management.  Vaginal Health Reports of pain during intercourse and recurrent bacterial vaginosis. -Prescribe clindamycin tablets for bacterial vaginosis. -Prescribe Diflucan as needed for yeast infections. -Consider vaginal estrogen cream for vaginal dryness and atrophy.  General Health Maintenance -Scheduled mammogram for Tuesday, February 4th, 2025. -Perform Pap smear during today's visit. -Consider IUD placement for contraception and perimenopausal cycle changes. -Check-in for follow-up appointment on the effectiveness of treatments and potential IUD  placement.  Dorathy Kinsman, CNM

## 2023-09-27 NOTE — Patient Instructions (Signed)
VISIT SUMMARY:  Today, you came in for your annual gynecology exam and discussed several symptoms you have been experiencing, including hair thinning, low libido, sleep disturbances, weight gain, and vaginal dryness. We talked about how these symptoms might be related to perimenopause or your history of PCOS. We also addressed your concerns about vaginal health, including pain during intercourse and recurrent infections.  YOUR PLAN:  -PERIMENOPAUSAL SYMPTOMS: Perimenopause is the transition period before menopause when hormonal changes can cause symptoms like hair thinning, low libido, sleep disturbances, and night sweats. We discussed using vaginal estrogen cream for vaginal dryness and considering a referral to a hormone specialist for further evaluation and management.  -POLYCYSTIC OVARY SYNDROME (PCOS): PCOS is a hormonal disorder causing enlarged ovaries with small cysts. Your recent weight gain and hair thinning may be related to PCOS or perimenopause. We will continue your current management and consider referring you to a hormone specialist for further evaluation.  -VAGINAL HEALTH: You reported pain during intercourse and recurrent bacterial vaginosis. We prescribed clindamycin tablets for bacterial vaginosis and Diflucan for yeast infections as needed. We also discussed using vaginal estrogen cream for dryness and atrophy.  -GENERAL HEALTH MAINTENANCE: We performed a Pap smear today and scheduled a mammogram for February 4th, 2025. We also discussed the possibility of placing an IUD for contraception and to help with perimenopausal cycle changes.  INSTRUCTIONS:  Please follow up with [place] to discuss the effectiveness of the treatments and the potential IUD placement. Your mammogram is scheduled for Tuesday, February 4th, 2025.

## 2023-09-28 LAB — HEPATITIS C ANTIBODY: Hep C Virus Ab: NONREACTIVE

## 2023-09-28 LAB — HEPATITIS B SURFACE ANTIGEN: Hepatitis B Surface Ag: NEGATIVE

## 2023-09-28 LAB — RPR: RPR Ser Ql: NONREACTIVE

## 2023-09-28 LAB — HIV ANTIBODY (ROUTINE TESTING W REFLEX): HIV Screen 4th Generation wRfx: NONREACTIVE

## 2023-10-01 ENCOUNTER — Telehealth: Payer: Self-pay | Admitting: Lactation Services

## 2023-10-01 NOTE — Telephone Encounter (Signed)
Jasmine Castaneda (Key: [id number]) PA Case ID #: [id number] Rx #: [id number] Need Help? Call us at [phone] Status sent iconSent to Plan today Drug Estradiol 0.1MG /GM cream ePA cloud logo Form [facility] Medicaid of [facility] Electronic Prior Authorization Request Form [phone] NCPDP) Original Claim Info 97 Submit 3 DS For [name] with PA Type01, PA Number 1111, Level of Service 3*WAG*Non-Form Product- Potential Alternatives are: [id number] [name], [phone] [name], [phone] - [place], 74

## 2023-10-02 LAB — CYTOLOGY - PAP
Chlamydia: NEGATIVE
Comment: NEGATIVE
Comment: NEGATIVE
Comment: NEGATIVE
Comment: NORMAL
Diagnosis: NEGATIVE
High risk HPV: NEGATIVE
Neisseria Gonorrhea: NEGATIVE
Trichomonas: NEGATIVE

## 2023-10-04 NOTE — Telephone Encounter (Signed)
Jasmine Castaneda (Key: [id number]) PA Case ID #: [id number] Rx #: [id number] Need Help? Call us at [phone] Outcome Denied on January 20 by [facility] Medicaid 2017 Denied. Per the health plan's preferred drug list, at least 2 preferred drugs must be tried before requesting this drug or tell us why the member cannot try any preferred alternatives. Please send [place] supporting chart notes and lab results. Here is list of preferred alternatives: Estring Vaginal Ring, Premarin Vaginal Cream, Vagifem Vaginal Tablet. Note: Some preferred drug(s) may have quantity limits. Refer to the health plan's preferred drug list for additional details. Drug Estradiol 0.1MG /GM cream ePA cloud logo Form [facility] Medicaid of [employer] Electronic Prior Authorization Request Form [phone] [place]DP) Original Claim Info 49 Submit 3 DS For [name] with PA Type01, PA Number 1111, Level of Service 3*WAG*Non-Form Product- Potential Alternatives are: [id number] [name], [id number] [name], [phone] - [place], 74  Estradiol Vaginal Cream denied. Approved alternatives are EString, Vagifem, Premarin, and Fem Ring. Please advise.

## 2023-10-16 ENCOUNTER — Ambulatory Visit
Admission: RE | Admit: 2023-10-16 | Discharge: 2023-10-16 | Disposition: A | Discharge status: Home / Self Care | Payer: Medicaid Other | Source: Ambulatory Visit | Attending: Advanced Practice Midwife | Admitting: Advanced Practice Midwife

## 2023-10-16 DIAGNOSIS — Z0000 Encounter for general adult medical examination without abnormal findings: Secondary | ICD-10-CM

## 2023-10-29 NOTE — Progress Notes (Unsigned)
GYNECOLOGY VISIT  Patient name: Jasmine Castaneda MRN [id number]  Date of birth: [birth year] Chief Complaint:   Procedure and Contraception  History:  Jasmine Castaneda is a 43 y.o. G1P0101 being seen today for IUD insertion and discussion of perimenopause symptoms    Seen 09/27/2023 for irregular menses, hot flashes believed to be due to PCOS as well as vaginal dryness  Discussed the use of AI scribe software for clinical note transcription with the patient, who gave verbal consent to proceed.  History of Present Illness   Jasmine Castaneda is a 43 year old female with PCOS who presents for IUD placement to manage perimenopausal symptoms.  She is experiencing perimenopausal symptoms, including night sweats, and her estrogen levels are reportedly 'a little off'. She has a history of polycystic ovary syndrome (PCOS). She previously had an IUD placed, which was removed due to scar tissue formation. This occurred several years ago, and she underwent a procedure to remove the scar tissue and old blood. Her menstrual cycles became regular after her daughter began menstruating. She does not experience painful or heavy periods, except for significant discomfort on the second day of her cycle.  She has a history of high blood pressure, which she attributes to stress rather than a family history, as she is the only one in her family with this condition. She has been prescribed medications like amlodipine but prefers a holistic approach, including teas and exercise, to manage her blood pressure. She is willing to take medication if necessary but is focused on addressing the root cause of her stress-induced hypertension.       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   Health Maintenance:   Last pap     Component Value Date/Time   DIAGPAP  09/27/2023 1615    - Negative for intraepithelial lesion or malignancy (NILM)   DIAGPAP  01/29/2018 0000    NEGATIVE FOR INTRAEPITHELIAL LESIONS OR MALIGNANCY.   HPVHIGH Negative 09/27/2023 1615   ADEQPAP  09/27/2023 1615    Satisfactory for evaluation; transformation zone component PRESENT.   ADEQPAP  01/29/2018 0000    Satisfactory for evaluation  endocervical/transformation zone component PRESENT.    High Risk HPV: Positive  Adequacy:  Satisfactory for evaluation, transformation zone component PRESENT  Diagnosis:  Atypical squamous cells of undetermined significance (ASC-US)  Last mammogram: 10/2023 BIRADS 1   Review of Systems:  Pertinent items are noted in HPI. Comprehensive review of systems was otherwise negative.   Objective:  Physical Exam BP (!) 164/113   Pulse 69   Wt 189 lb 6.4 oz (85.9 kg)   LMP 09/22/2023  BMI 28.80 kg/m    Physical Exam Vitals and nursing note reviewed. Exam conducted with a chaperone present.  Constitutional:      Appearance: Normal appearance.  HENT:     Head: Normocephalic and atraumatic.  Pulmonary:     Effort: Pulmonary effort is normal.     Breath sounds: Normal breath sounds.  Genitourinary:    General: Normal vulva.     Exam position: Lithotomy position.     Vagina: Normal.     Cervix: Normal.  Skin:    General: Skin is warm and dry.   Neurological:     General: No focal deficit present.     Mental Status: She is alert.  Psychiatric:        Mood and Affect: Mood normal.        Behavior: Behavior normal.        Thought Content: Thought content normal.        Judgment: Judgment normal.    IUD Insertion Procedure Note Patient identified, informed consent performed, consent signed.   Discussed risks of irregular bleeding, cramping, infection, malpositioning or misplacement of the IUD outside the uterus which may require further procedure such as laparoscopy. Also discussed >99% contraception efficacy, increased risk of ectopic pregnancy with failure of method.  Time out was performed.  Urine pregnancy test negative.  Speculum placed in the vagina.  Cervix visualized.  Cleaned with Betadine x 2.  Anterior cervix grasped with a single tooth tenaculum.  Paracervical block was administered.  Mirena IUD placed per manufacturer's recommendations.  Strings trimmed to 3 cm. Tenaculum was removed, good hemostasis noted.  Patient tolerated procedure well.   Patient was given post-procedure instructions.  She was advised to have backup contraception for one week.  Patient was also asked to check IUD strings periodically and follow up prn for IUD check.       Assessment & Plan:   Assessment and Plan    Perimenopausal Symptoms Reports of night sweats and mood changes. Discussed the benefits and limitations of an IUD in managing these symptoms. The patient is interested in a Mirena IUD, which can help with menstrual irregularities but may not address all perimenopausal symptoms. -s/p Mirena IUD today. -Consider adding estrogen therapy pending lab results and cardiovascular risk assessment. -Note slightly elevated breast cancer risk given first degree relative with breast cancer, normal mammogram earlier this year -Check A1c and cholesterol levels today to assess cardiovascular risk before considering hormone therapy. -noted that  lng-IUD provided endometrial protection, may help with decreasing frequency of BV if caused by menstrual blood -reviewed that lng-IUD helpful for bleeding management but not systemic symptoms of estrogen fluctuations in perimenopause  -important to take care of cardiovascular health in the perimenopause transition and if exogenous hormones provided  Polycystic Ovary Syndrome (PCOS) & Perimenopause History of PCOS, but current symptoms seem more related to perimenopause. -lng-IUD for endometrial protection in the setting of likely decreased ovulation  Hypertension Patient reports high blood pressure and is attempting lifestyle modifications for management. Currently not on medication but has been prescribed amlodipine in the past. -Encourage continued lifestyle modifications.       Routine preventative health maintenance measures emphasized.  Lorriane Shire, MD [facility] for [facility], [facility]

## 2023-10-30 ENCOUNTER — Other Ambulatory Visit: Payer: Self-pay

## 2023-10-30 ENCOUNTER — Ambulatory Visit: Payer: Medicaid Other | Admitting: Obstetrics and Gynecology

## 2023-10-30 VITALS — BP 164/113 | HR 69 | Wt 189.4 lb

## 2023-10-30 DIAGNOSIS — Z1331 Encounter for screening for depression: Secondary | ICD-10-CM

## 2023-10-30 DIAGNOSIS — Z3202 Encounter for pregnancy test, result negative: Secondary | ICD-10-CM

## 2023-10-30 DIAGNOSIS — I10 Essential (primary) hypertension: Secondary | ICD-10-CM

## 2023-10-30 DIAGNOSIS — Z30430 Encounter for insertion of intrauterine contraceptive device: Secondary | ICD-10-CM

## 2023-10-30 DIAGNOSIS — N951 Menopausal and female climacteric states: Secondary | ICD-10-CM

## 2023-10-30 MED ORDER — LEVONORGESTREL 20 MCG/DAY IU IUD
1.0000 | INTRAUTERINE_SYSTEM | Freq: Once | INTRAUTERINE | Status: AC
  Administered 2023-10-30: 1 via INTRAUTERINE

## 2023-10-30 NOTE — Patient Instructions (Signed)
 Intrauterine Device (IUD) Insertion: What to Expect  An intrauterine device (IUD) is put in (inserted) your uterus to prevent pregnancy. It's a small, T-shaped device that has one or two nylon strings hanging down from it. The strings hang out of your cervix, which is the lowest part of your uterus. Tell a health care provider about: Any allergies you have. All medicines you take. These include vitamins, herbs, eye drops, and creams. Any surgeries you have had. Any medical problems you have. Whether you're pregnant or may be pregnant. What are the risks? Your health care provider will talk with you about risks. These may include: Infection. Bleeding. Allergic reactions to medicines. A cut to the uterus, also called perforation, or damage to other structures or organs. Accidental placement of the IUD either in the muscle layer of the uterus or outside the uterus. The IUD falling out of the uterus. This is more common if you recently had a baby. Higher risk of ectopic pregnancy. This is when an egg is fertilized outside your uterus. This is rare. Pelvic inflammatory disease (PID). This is an infection in the uterus and fallopian tubes. The IUD doesn't cause the infection. The infection is usually from a sexually transmitted infection (STI). If this happens, it is usually during the first 20 days after the IUD is put in. This is rare. What happens before? Ask about changing or stopping: Any medicines you take. Any vitamins, herbs, or supplements you take. Your provider may tell you to take pain medicines you can buy at the store before the procedure. You may have tests for: Pregnancy. You may have a pee (urine) or blood sample taken. STIs. Placing an IUD can make an infection worse. To check for cervical cancer. You may have a Pap test, which is when cells from your cervix are removed for testing. What happens during an IUD insertion? A tool, called a speculum, will be placed in your  vagina and widened so that your provider can see your cervix. A medicine to clean your cervix may be used to help lower your risk of infection. You may be given medicine to numb your cervix. This medicine is usually given by an injection into your cervix. A tool will be put into your uterus to check the length of your uterus. A thin tube that holds the IUD will be put into your vagina, through the opening of your cervix, and into your uterus. The IUD will be placed in your uterus. The tube that holds the IUD will be removed. The strings that are attached to the IUD will be trimmed so that they sit just outside your cervix. The speculum will be removed. These steps may vary. Ask what you can expect. What happens after? You may have: Bleeding. It can vary from light bleeding or spotting for a few days to period-like bleeding. This is normal. Cramps and pain in your belly. Dizziness or light-headedness. Pain in your lower back. Headaches and the feeling like you may throw up Follow these instructions at home: Do not have sex or put anything into your vagina for 24 hours after the IUD is placed. Before having sex, check to make sure that you can feel the IUD string or strings. You should be able to feel the end of the string below the opening of your cervix. If your IUD string is in place, you may continue with sex. If you had a hormonal IUD put in more than 7 days after your most recent period  started, you will need to use a backup method of birth control for 7 days after the IUD was placed. Hormones are chemicals that affect how the body works. Check that the IUD is still in place by feeling for the strings after every period, or check once a month. Use a condom every time you have sex to prevent STIs. An IUD won't protect you from STIs. Take your medicines only as told. Contact a health care provider if: You have any of the following problems with your IUD string or strings: The string  bothers or hurts you or your sexual partner. You can't feel the string. The string has gotten longer. The IUD comes out or you can feel the IUD in your vagina. You think you may be pregnant, or you miss your period. You think you may have an STI. You have bad-smelling discharge from your vagina. You have a fever and chills. You have pain during sex. Get help right away if: You have heavy bleeding, which means soaking more than 2 pads per hour for 2 hours in a row. You have sudden, really bad belly pain. This information is not intended to replace advice given to you by your health care provider. Make sure you discuss any questions you have with your health care provider. Document Revised: 05/07/2023 Document Reviewed: 05/07/2023 Elsevier Patient Education  2024 [employer].

## 2023-10-30 NOTE — Addendum Note (Signed)
Addended by: Brien Mates T on: 10/30/2023 11:06 AM   Modules accepted: Orders

## 2023-10-31 LAB — CBC
Hematocrit: 39.6 % (ref 34.0–46.6)
Hemoglobin: 13.2 g/dL (ref 11.1–15.9)
MCH: 31.4 pg (ref 26.6–33.0)
MCHC: 33.3 g/dL (ref 31.5–35.7)
MCV: 94 fL (ref 79–97)
Platelets: 314 10*3/uL (ref 150–450)
RBC: 4.21 x10E6/uL (ref 3.77–5.28)
RDW: 12 % (ref 11.7–15.4)
WBC: 3.6 10*3/uL (ref 3.4–10.8)

## 2023-10-31 LAB — LIPID PANEL
Chol/HDL Ratio: 1.9 {ratio} (ref 0.0–4.4)
Cholesterol, Total: 184 mg/dL (ref 100–199)
HDL: 98 mg/dL (ref >39)
LDL Chol Calc (NIH): 67 mg/dL (ref 0–99)
Triglycerides: 108 mg/dL (ref 0–149)
VLDL Cholesterol Cal: 19 mg/dL (ref 5–40)

## 2023-10-31 LAB — COMPREHENSIVE METABOLIC PANEL
ALT: 13 [IU]/L (ref 0–32)
AST: 16 [IU]/L (ref 0–40)
Albumin: 4.4 g/dL (ref 3.9–4.9)
Alkaline Phosphatase: 39 [IU]/L — ABNORMAL LOW (ref 44–121)
BUN/Creatinine Ratio: 9 (ref 9–23)
BUN: 7 mg/dL (ref 6–24)
Bilirubin Total: 0.4 mg/dL (ref 0.0–1.2)
CO2: 20 mmol/L (ref 20–29)
Calcium: 9.4 mg/dL (ref 8.7–10.2)
Chloride: 104 mmol/L (ref 96–106)
Creatinine, Ser: 0.82 mg/dL (ref 0.57–1.00)
Globulin, Total: 2.4 g/dL (ref 1.5–4.5)
Glucose: 82 mg/dL (ref 70–99)
Potassium: 4.5 mmol/L (ref 3.5–5.2)
Sodium: 139 mmol/L (ref 134–144)
Total Protein: 6.8 g/dL (ref 6.0–8.5)
eGFR: 93 mL/min/{1.73_m2} (ref >59)

## 2023-10-31 LAB — HEMOGLOBIN A1C
Est. average glucose Bld gHb Est-mCnc: 91 mg/dL
Hgb A1c MFr Bld: 4.8 % (ref 4.8–5.6)

## 2023-10-31 LAB — POCT PREGNANCY, URINE: Preg Test, Ur: NEGATIVE

## 2023-11-01 ENCOUNTER — Encounter: Payer: Self-pay | Admitting: General Practice

## 2023-11-02 ENCOUNTER — Encounter: Payer: Self-pay | Admitting: Obstetrics and Gynecology

## 2023-11-02 ENCOUNTER — Telehealth: Payer: Self-pay | Admitting: Obstetrics and Gynecology

## 2023-11-02 DIAGNOSIS — N951 Menopausal and female climacteric states: Secondary | ICD-10-CM

## 2023-11-02 MED ORDER — ESTRADIOL 0.5 MG PO TABS: 0.5000 mg | ORAL_TABLET | Freq: Every day | ORAL | 12 refills | Status: DC

## 2023-11-02 NOTE — Telephone Encounter (Signed)
Called and confirmed ID [id number]. Reviewed results demonstrate normal labs with ASCVD 10 year risk is 0.49%. mother had ovarian, parotid and breast cancer (in the axillary tissue, unclear if hormonal). Normal mammogram this year. Has progesterone IUD in place. Reviewed risks and benefits of HRT. Will follow up in 4-6 weeks.

## 2023-12-13 ENCOUNTER — Telehealth (INDEPENDENT_AMBULATORY_CARE_PROVIDER_SITE_OTHER): Payer: Medicaid Other | Admitting: Obstetrics and Gynecology

## 2023-12-13 DIAGNOSIS — Z975 Presence of (intrauterine) contraceptive device: Secondary | ICD-10-CM | POA: Diagnosis not present

## 2023-12-13 DIAGNOSIS — N951 Menopausal and female climacteric states: Secondary | ICD-10-CM

## 2023-12-13 NOTE — Progress Notes (Unsigned)
 GYNECOLOGY VIRTUAL VISIT ENCOUNTER NOTE  Provider location: Center for [facility]   Patient location: Home  I connected with Jasmine Castaneda on 12/13/23 at  1:35 PM EDT by MyChart Video Encounter and verified that I am speaking with the correct person using two identifiers.   I discussed the limitations, risks, security and privacy concerns of performing an evaluation and management service virtually and the availability of in person appointments. I also discussed with the patient that there may be a patient responsible charge related to this service. The patient expressed understanding and agreed to proceed.   History:  Jasmine Castaneda is a 43 y.o. G6P0101 female being evaluated today for HRT follow up.  Took estrogen pills for 2 weeks and had BTB and stopped. Menses has just started. Notes having about 3 weeks of bleeding. Would prefer to wait   Having some bleeding today, using super tampon. Start of next menses. Does not feel any different from prior.  Will keep IUD for now and see how it goes going forward  Insurance denied vaginal estrogen - does not want a new prescription sent to the pharmacy   Does not want to try any other medication for the hot flases  Started her anti-hypertensives since last visit and BP better controlled     Past Medical History:  Diagnosis Date   Abnormal Pap smear    leep   Asthma    Family history of breast cancer    Family history of cancer of pituitary gland and craniopharyngeal duct    Family history of colon cancer    Family history of ovarian cancer    Family history of prostate cancer    Family history of uterine cancer    Hypertension    Infertility    PCOS (polycystic ovarian syndrome)    Positive RPR test 06/11/2019   Repeat neg January 2021   Prediabetes    Past Surgical History:  Procedure Laterality Date   LEEP  2006   The following portions of the patient's history were reviewed  and updated as appropriate: allergies, current medications, past family history, past medical history, past social history, past surgical history and problem list.   Health Maintenance:      Component Value Date/Time   DIAGPAP  09/27/2023 1615    - Negative for intraepithelial lesion or malignancy (NILM)   DIAGPAP  01/29/2018 0000    NEGATIVE FOR INTRAEPITHELIAL LESIONS OR MALIGNANCY.   HPVHIGH Negative 09/27/2023 1615   ADEQPAP  09/27/2023 1615    Satisfactory for evaluation; transformation zone component PRESENT.   ADEQPAP  01/29/2018 0000    Satisfactory for evaluation  endocervical/transformation zone component PRESENT.    High Risk HPV: Positive  Adequacy:  Satisfactory for evaluation, transformation zone component PRESENT  Diagnosis:  Atypical squamous cells of undetermined significance (ASC-US)   Review of Systems:  Pertinent items noted in HPI and remainder of comprehensive ROS otherwise negative.  Physical Exam:   General:  Alert, oriented and cooperative. Patient appears to be in no acute distress.  Mental Status: Normal mood and affect. Normal behavior. Normal judgment and thought content.   Respiratory: Normal respiratory effort, no problems with respiration noted  Rest of physical exam deferred due to type of encounter    Assessment and Plan:     1. Perimenopausal vasomotor symptoms (Primary) Declines HRT at this time due to having adverse effects. Does not want vaginal estrogen at this time.  2. IUD (intrauterine device) in place Will keep IUD for now and if abnormal bleeding persists despite 3-6 months of use, will trial alternate method for control      I discussed the assessment and treatment plan with the patient. The patient was provided an opportunity to ask questions and all were answered. The patient agreed with the plan and demonstrated an understanding of the instructions.   The patient was advised to call back or seek an in-person evaluation/go  to the ED if the symptoms worsen or if the condition fails to improve as anticipated.  I provided 5 minutes of face-to-face time during this encounter. I also spent 5 minutes dedicated to the care of this patient including pre-visit review of records, post visit ordering of medications and appropriate tests or procedures, coordinating care and documenting this visit encounter.    Lorriane Shire, MD Center for [employer], [facility]

## 2023-12-19 ENCOUNTER — Other Ambulatory Visit: Payer: Self-pay | Admitting: Physician Assistant

## 2023-12-19 DIAGNOSIS — Z1231 Encounter for screening mammogram for malignant neoplasm of breast: Secondary | ICD-10-CM

## 2024-02-11 ENCOUNTER — Ambulatory Visit (INDEPENDENT_AMBULATORY_CARE_PROVIDER_SITE_OTHER): Admitting: Podiatry

## 2024-02-11 ENCOUNTER — Ambulatory Visit (INDEPENDENT_AMBULATORY_CARE_PROVIDER_SITE_OTHER)

## 2024-02-11 DIAGNOSIS — M21612 Bunion of left foot: Secondary | ICD-10-CM

## 2024-02-11 DIAGNOSIS — M7752 Other enthesopathy of left foot: Secondary | ICD-10-CM | POA: Diagnosis not present

## 2024-02-11 DIAGNOSIS — M21611 Bunion of right foot: Secondary | ICD-10-CM | POA: Diagnosis not present

## 2024-02-11 DIAGNOSIS — B351 Tinea unguium: Secondary | ICD-10-CM

## 2024-02-11 DIAGNOSIS — Z79899 Other long term (current) drug therapy: Secondary | ICD-10-CM | POA: Diagnosis not present

## 2024-02-11 DIAGNOSIS — M7751 Other enthesopathy of right foot: Secondary | ICD-10-CM | POA: Diagnosis not present

## 2024-02-11 MED ORDER — TERBINAFINE HCL 250 MG PO TABS: 250.0000 mg | ORAL_TABLET | Freq: Every day | ORAL | 0 refills | Status: DC

## 2024-02-11 NOTE — Patient Instructions (Signed)
 Get the blood work done in 4 weeks  --  Terbinafine Tablets What is this medication? TERBINAFINE (TER bin a feen) treats fungal infections of the nails. It belongs to a group of medications called antifungals. It will not treat infections caused by bacteria or viruses. This medicine may be used for other purposes; ask your health care provider or pharmacist if you have questions. COMMON BRAND NAME(S): Lamisil, Terbinex What should I tell my care team before I take this medication? They need to know if you have any of these conditions: Liver disease An unusual or allergic reaction to terbinafine, other medications, foods, dyes, or preservatives Pregnant or trying to get pregnant Breast-feeding How should I use this medication? Take this medication by mouth with water. Take it as directed on the prescription label at the same time every day. You can take it with or without food. If it upsets your stomach, take it with food. Keep taking it unless your care team tells you to stop. A special MedGuide will be given to you by the pharmacist with each prescription and refill. Be sure to read this information carefully each time. Talk to your care team about the use of this medication in children. Special care may be needed. Overdosage: If you think you have taken too much of this medicine contact a poison control center or emergency room at once. NOTE: This medicine is only for you. Do not share this medicine with others. What if I miss a dose? If you miss a dose, take it as soon as you can unless it is more than 4 hours late. If it is more than 4 hours late, skip the missed dose. Take the next dose at the normal time. What may interact with this medication? Do not take this medication with any of the following: Pimozide Thioridazine This medication may also interact with the following: Beta blockers Caffeine Certain medications for mental health  conditions Cimetidine Cyclosporine Medications for fungal infections, such as fluconazole  or ketoconazole Medications for irregular heartbeat, such as amiodarone, flecainide, propafenone Rifampin Warfarin This list may not describe all possible interactions. Give your health care provider a list of all the medicines, herbs, non-prescription drugs, or dietary supplements you use. Also tell them if you smoke, drink alcohol, or use illegal drugs. Some items may interact with your medicine. What should I watch for while using this medication? Visit your care team for regular checks on your progress. Tell your care team if your symptoms do not start to get better or if they get worse. This medication may cause serious skin reactions. They can happen weeks to months after starting the medication. Contact your care team right away if you notice fevers or flu-like symptoms with a rash. The rash may be red or purple and then turn into blisters or peeling of the skin. You may also notice a red rash with swelling of the face, lips, or lymph nodes in your neck or under your arms. This medication can make you more sensitive to the sun. Keep out of the sun. If you cannot avoid being in the sun, wear protective clothing and sunscreen. Do not use sun lamps, tanning beds, or tanning booths. What side effects may I notice from receiving this medication? Side effects that you should report to your care team as soon as possible: Allergic reactions--skin rash, itching, hives, swelling of the face, lips, tongue, or throat Change in sense of smell Change in taste Infection--fever, chills, cough, or sore throat Liver  injury--right upper belly pain, loss of appetite, nausea, light-colored stool, dark yellow or brown urine, yellowing skin or eyes, unusual weakness or fatigue Low red blood cell level--unusual weakness or fatigue, dizziness, headache, trouble breathing Lupus-like syndrome--joint pain, swelling, or  stiffness, butterfly-shaped rash on the face, rashes that get worse in the sun, fever, unusual weakness or fatigue Rash, fever, and swollen lymph nodes Redness, blistering, peeling, or loosening of the skin, including inside the mouth Unusual bruising or bleeding Worsening mood, feelings of depression Side effects that usually do not require medical attention (report to your care team if they continue or are bothersome): Diarrhea Gas Headache Nausea Stomach pain Upset stomach This list may not describe all possible side effects. Call your doctor for medical advice about side effects. You may report side effects to FDA at 1-800-FDA-1088. Where should I keep my medication? Keep out of the reach of children and pets. Store between 20 and 25 degrees C (68 and 77 degrees F). Protect from light. Get rid of any unused medication after the expiration date. To get rid of medications that are no longer needed or have expired: Take the medication to a medication take-back program. Check with your pharmacy or law enforcement to find a location. If you cannot return the medication, check the label or package insert to see if the medication should be thrown out in the garbage or flushed down the toilet. If you are not sure, ask your care team. If it is safe to put it in the trash, take the medication out of the container. Mix the medication with cat litter, dirt, coffee grounds, or other unwanted substance. Seal the mixture in a bag or container. Put it in the trash. NOTE: This sheet is a summary. It may not cover all possible information. If you have questions about this medicine, talk to your doctor, pharmacist, or health care provider.  2024 Elsevier/Gold Standard (2023-03-16 00:00:00)  -  Pre-Operative Instructions  Congratulations, you have decided to take an important step to improving your quality of life.  You can be assured that the doctors of [facility] will be with you every step of  the way.  Plan to be at the surgery center/hospital at least 1 (one) hour prior to your scheduled time unless otherwise directed by the surgical center/hospital staff.  You must have a responsible adult accompany you, remain during the surgery and drive you home.  Make sure you have directions to the surgical center/hospital and know how to get there on time. For hospital based surgery you will need to obtain a history and physical form from your family physician within 1 month prior to the date of surgery- we will give you a form for you primary physician.  We make every effort to accommodate the date you request for surgery.  There are however, times where surgery dates or times have to be moved.  We will contact you as soon as possible if a change in schedule is required.   No Aspirin/Ibuprofen  for one week before surgery.  If you are on aspirin, any non-steroidal anti-inflammatory medications (Mobic, Aleve, Ibuprofen ) you should stop taking it 7 days prior to your surgery.  You make take Tylenol   For pain prior to surgery.  Medications- If you are taking daily heart and blood pressure medications, seizure, reflux, allergy, asthma, anxiety, pain or diabetes medications, make sure the surgery center/hospital is aware before the day of surgery so they may notify you which medications to take or  avoid the day of surgery. No food or drink after midnight the night before surgery unless directed otherwise by surgical center/hospital staff. No alcoholic beverages 24 hours prior to surgery.  No smoking 24 hours prior to or 24 hours after surgery. Wear loose pants or shorts- loose enough to fit over bandages, boots, and casts. No slip on shoes, sneakers are best. Bring your boot with you to the surgery center/hospital.  Also bring crutches or a walker if your physician has prescribed it for you.  If you do not have this equipment, it will be provided for you after surgery. If you have not been contracted by  the surgery center/hospital by the day before your surgery, call to confirm the date and time of your surgery. Leave-time from work may vary depending on the type of surgery you have.  Appropriate arrangements should be made prior to surgery with your employer. Prescriptions will be provided immediately following surgery by your doctor.  Have these filled as soon as possible after surgery and take the medication as directed. Remove nail polish on the operative foot. Wash the night before surgery.  The night before surgery wash the foot and leg well with the antibacterial soap provided and water paying special attention to beneath the toenails and in between the toes.  Rinse thoroughly with water and dry well with a towel.  Perform this wash unless told not to do so by your physician.  Enclosed: 1 Ice pack (please put in freezer the night before surgery)   1 Hibiclens skin cleaner   Pre-op Instructions  If you have any questions regarding the instructions, do not hesitate to call our office at any point during this process.   [place]: [address] [place], [place] [address] [phone]  [place]: [address], [place], [place] [address] [phone]  Dr. Bobbie Burows, DPM

## 2024-02-12 ENCOUNTER — Telehealth: Payer: Self-pay | Admitting: Podiatry

## 2024-02-12 NOTE — Telephone Encounter (Signed)
 Received surgical consent, called pt and she is scheduled for 8/27 that is day she wanted.She stated she would call back with some questions. I will confirm medications and pharmacy at that time.

## 2024-02-12 NOTE — Progress Notes (Signed)
 Subjective:  Patient ID: Jasmine Castaneda, female    DOB: 1980-11-30,  MRN: [id number]  Chief Complaint  Patient presents with   Bunions    Bilateral. Ongoing for 1-2 months. Hurts to walk. Interested in having both removed both are near 1st hallux. They seem to be growing over the past couple of months, wearing shoes is uncomfortable.    Nail Problem    Right 1st has a fungal infection. She has photos due to having her nails done.     Discussed the use of AI scribe software for clinical note transcription with the patient, who gave verbal consent to proceed.  History of Present Illness Jasmine PEDERSON is a 43 year old female who presents with bunions and nail fungus.  Bunions have been present for some time but has been worsening since November, causing redness and rubbing, especially after shoe removal. Both feet are equally affected, with toe curvature contributing to the issue. She has attempted self-management with wider shoes and without significant improvement.  Nail fungus affects two toes, with nails appearing yellowy and brown. Pain is often due to ingrown nails. She has not pursued treatment for the fungus.  She denies major medical issues and does not smoke. Allergies include Bactrim and metronidazole . 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      Objective:    Physical Exam General: AAO x3, NAD  Dermatological: Hallux nails mostly affected within the APB hypertrophic, dystrophic with yellow, brown discoloration base of a picture.  There is no edema, erythema or signs of infection today.  There are no open lesions.  Vascular: Dorsalis Pedis artery and Posterior Tibial artery pedal pulses are 2/4 bilateral with immedate capillary fill time. There is no pain with calf compression, swelling, warmth, erythema.   Neruologic: Grossly intact via light touch bilateral.  Musculoskeletal: Moderate bunions present bilaterally with tenderness palpation directly in  the medial aspect of the first metatarsal along the area of the bunion.  There is no crepitation or restriction with first MTPJ range of motion.  There is no hypermobility present of the first ray.  Gait: Unassisted, Nonantalgic.     No images are attached to the encounter.    Results LABS Liver function test: normal (10/2023)  RADIOLOGY Bilateral foot x-rays were obtained and reviewed.  There is no evidence of acute fracture.  Mild to moderate increase in first intermetatarsal angle.   Assessment:   1. Bunion, left   2. Bunion, right   3. Long-term use of high-risk medication   4. Dermatophytosis of nail      Plan:  Patient was evaluated and treated and all questions answered.  Assessment and Plan Assessment & Plan Bunions Bilateral bunions with structural deformity causing pain.  Non-surgical options ineffective. Surgical correction reasonable given age and symptoms.  Should proceed with surgery. -We discussed also bunionectomy on the right side. - The incision placement as well as the postoperative course was discussed with the patient. I discussed risks of the surgery which include, but not limited to, infection, bleeding, pain, swelling, need for further surgery, delayed or nonhealing, painful or ugly scar, numbness or sensation changes, over/under correction, recurrence, transfer lesions, further deformity, hardware failure, DVT/PE, loss of toe/foot. Patient understands these risks and wishes to proceed with surgery. The surgical consent was reviewed with the patient all 3 pages were signed. No promises or guarantees were given to the outcome of the procedure. All questions were answered to the best of my ability.  Before the surgery the patient was encouraged to call the office if there is any further questions. The surgery will be performed at the [facility] on an outpatient basis. - Discuss potential need for post-surgery physical therapy. - Discuss risks and benefits of surgery,  including complications and recovery timeline.  Nail fungus Chronic nail fungus with yellow and brown discoloration. Oral terbinafine recommended due to high success rate and low liver risk. Topical treatments less effective. - Prescribe oral terbinafine, check liver function tests before starting and at four weeks. - Send prescription to [facility] on [address]. - Discuss potential side effects of terbinafine and success rates.   No follow-ups on file.   Charity Conch DPM

## 2024-03-03 ENCOUNTER — Ambulatory Visit: Attending: Cardiology | Admitting: Cardiology

## 2024-03-03 ENCOUNTER — Encounter: Payer: Self-pay | Admitting: Cardiology

## 2024-03-03 VITALS — BP 168/98 | HR 65 | Ht 68.0 in | Wt 186.2 lb

## 2024-03-03 DIAGNOSIS — I119 Hypertensive heart disease without heart failure: Secondary | ICD-10-CM | POA: Diagnosis not present

## 2024-03-03 DIAGNOSIS — Z7689 Persons encountering health services in other specified circumstances: Secondary | ICD-10-CM | POA: Insufficient documentation

## 2024-03-03 DIAGNOSIS — E282 Polycystic ovarian syndrome: Secondary | ICD-10-CM | POA: Diagnosis present

## 2024-03-03 DIAGNOSIS — I10 Essential (primary) hypertension: Secondary | ICD-10-CM | POA: Diagnosis not present

## 2024-03-03 MED ORDER — HYDROCHLOROTHIAZIDE 25 MG PO TABS: 25.0000 mg | ORAL_TABLET | Freq: Every day | ORAL | 3 refills | Status: AC

## 2024-03-03 MED ORDER — AMLODIPINE BESYLATE 10 MG PO TABS: 10.0000 mg | ORAL_TABLET | Freq: Every day | ORAL | 3 refills | Status: AC

## 2024-03-03 NOTE — Progress Notes (Unsigned)
 Cardiology Office Note:  .   Date:  03/04/2024  ID:  Jasmine Castaneda, DOB 1981-05-09, MRN [id number] PCP: Jasmine Bare, MD  [facility] Providers Cardiologist:  Jasmine Clay, MD     Chief Complaint  Patient presents with   New Patient (Initial Visit)   Hypertension    Hypertension on minimal medicines    Patient Profile: .     Jasmine Castaneda is a relatively healthy appearing 43 y.o. female  with a PMH notable for HTN, PCOS, High TG who presents here for evaluation/treatment of hypertension at the request of Jasmine Castaneda, Jasmine Castaneda.     Jasmine Castaneda was seen by Jasmine Rosalea, PA on June 23.  Blood pressure noted to be 149/8111.  Referred to cardiology.  Subjective  Discussed the use of AI scribe software for clinical note transcription with the patient, who gave verbal consent to proceed.  History of Present Illness History of Present Illness Jasmine Castaneda is a 43 year old female with hypertension who presents for evaluation of uncontrolled high blood pressure. She was referred by her primary care physician for further evaluation due to prolonged high blood pressure.  She has been experiencing uncontrolled high blood pressure for an extended period, with diastolic readings typically around 120-130 mmHg. She is currently on amlodipine and hydrochlorothiazide for hypertension. An EKG was performed.  She has a history of polycystic ovary syndrome (PCOS) but is not on any specific medication for it. She occasionally takes clindamycin  during her menstrual cycles due to bacterial issues related to irregular cycles. She was started on Wegovy approximately six weeks ago for weight management, as she weighed around 200 pounds. Her weight has slightly decreased since starting [facility].  She denies a family history of hypertension and attributes her high blood pressure to underlying stress and PCOS. She experiences rare headaches and occasional diplopia.  She sleeps on two pillows and sometimes feels dyspneic when lying down. She reports edema in her legs that improves with elevation and experiences episodes of palpitations without dizziness or syncope. No chest pain, pressure, or tightness.       Objective   Current Meds  Medication Sig   Aspirin-Salicylamide-Caffeine (BC HEADACHE POWDER PO) Take 2 packets by mouth daily as needed (for pain/headaches).   clindamycin  (CLEOCIN ) 300 MG capsule Take 1 capsule (300 mg total) by mouth in the morning and at bedtime.   diphenhydrAMINE  (BENADRYL ) 25 mg capsule Take 25 mg by mouth every 6 (six) hours as needed for allergies.   fluconazole  (DIFLUCAN ) 150 MG tablet One tab po x 1 and repeat in 3 days if still having symptoms.   ibuprofen  (ADVIL ) 100 MG/5ML suspension Take 200 mg by mouth every 4 (four) hours as needed.   Multiple Vitamin (MULTIVITAMIN WITH MINERALS) TABS tablet Take 1 tablet by mouth daily.   Probiotic Product (PROBIOTIC PO) Take 1 capsule by mouth daily.   terbinafine  (LAMISIL ) 250 MG tablet Take 1 tablet (250 mg total) by mouth daily.   WEGOVY 0.25 MG/0.5ML SOAJ Inject 0.25 mg into the skin once a week.   []  amLODipine (NORVASC) 5 MG tablet Take 5 mg by mouth daily.   []  hydrochlorothiazide (HYDRODIURIL) 12.5 MG tablet Take 12.5 mg by mouth daily.    Studies Reviewed: SABRA   EKG Interpretation Date/Time:  Monday March 03 2024 11:11:46 EDT Ventricular Rate:  65 PR Interval:  190 QRS Duration:  88 QT Interval:  386 QTC Calculation: 401 R Axis:   56  Text Interpretation: Normal sinus rhythm Minimal voltage criteria for LVH, may be normal variant ( Cornell product ) Nonspecific T wave abnormality When compared with ECG of 30-Aug-2005 01:54, No significant change was found Confirmed by Jasmine Castaneda ([id number]) on 03/03/2024 11:52:22 AM    Labs from PCP office 03/29/2023: TC 181, TG 100, HDL 89, LDL 73.  A1c 4.9.  TSH 3.71.  Hgb 13.6. Cr 0.85, NA 138; K+ 4.7.  Normal LFTs.  Risk  Assessment/Calculations:     HYPERTENSION CONTROL Vitals:   03/03/24 1106 03/03/24 1125  BP: (!) 171/105 (!) 168/98    The patient's blood pressure is elevated above target today.  In order to address the patient's elevated BP: A referral to the [facility] will be placed.; A current anti-hypertensive medication was adjusted today. (Increase current medications to full dose amlodipine 10 mg and HCTZ 25 mg.  Next would be spironolactone.)           Physical Exam:   VS:  BP (!) 168/98   Pulse 65   Ht 5' 8 (1.727 m)   Wt 186 lb 3.2 oz (84.5 kg)   SpO2 99%   BMI 28.31 kg/m    Wt Readings from Last 3 Encounters:  03/03/24 186 lb 3.2 oz (84.5 kg)  10/30/23 189 lb 6.4 oz (85.9 kg)  09/27/23 189 lb 6.4 oz (85.9 kg)    GEN: Well nourished, well well-groomed in no acute distress; healthy-appearing. NECK: No JVD; No carotid bruits CARDIAC: Normal S1, S2; RRR, no murmurs, rubs, gallops RESPIRATORY:  Clear to auscultation without rales, wheezing or rhonchi ; nonlabored, good air movement. ABDOMEN: Soft, non-tender, non-distended EXTREMITIES:  No edema; No deformity      ASSESSMENT AND PLAN: .    Problem List Items Addressed This Visit       Cardiology Problems   LVH (left ventricular hypertrophy) due to hypertensive disease, without heart failure (Chronic)   LVH on EKG.  Likely related to hypertension. -Check 2D echo.      Relevant Medications   amLODipine (NORVASC) 10 MG tablet   hydrochlorothiazide (HYDRODIURIL) 25 MG tablet   Uncontrolled hypertension - Primary (Chronic)   Chronic hypertension with LVH on EKG.  Diastolic BP 120-130 mmHg.  PCOS likely contributing.   Current regimen insufficient. Discussed BP management to prevent cardiac damage. - Increase amlodipine to 10 mg daily. - Increase hydrochlorothiazide to 25 mg daily. - Order echocardiogram to assess cardiac function and LVH. - Order renal artery ultrasound to evaluate for renal artery  stenosis. - Obtain baseline kidney function tests and repeat in two weeks post-medication adjustment. - Likely next medication would be spironolactone-likely start at 25 mg, followed by [facility] - Refer to hypertension clinic for medication titration and management. - Advise taking antihypertensives post-breakfast to prevent dehydration-induced hypotension.      Relevant Medications   amLODipine (NORVASC) 10 MG tablet   hydrochlorothiazide (HYDRODIURIL) 25 MG tablet   Other Relevant Orders   EKG 12-Lead (Completed)   Basic metabolic panel with GFR (Completed)   Basic metabolic panel with GFR   VAS US  RENAL ARTERY DUPLEX   ECHOCARDIOGRAM COMPLETE   AMB Referral to [facility] Pharm-D   AMB REFERRAL TO ADVANCED HTN CLINIC     Other   PCOS (polycystic ovarian syndrome) (Chronic)   Followed by PCP/OB. Not currently on any medication for this besides Wegovy for weight loss. With this diagnosis, next viable medication for hypertension management would be spironolactone.      Other  Visit Diagnoses       Encounter to establish care       Relevant Orders   EKG 12-Lead (Completed)   Basic metabolic panel with GFR (Completed)   Basic metabolic panel with GFR   VAS US  RENAL ARTERY DUPLEX   ECHOCARDIOGRAM COMPLETE              Follow-Up: Return in about 3 months (around 06/03/2024) for 3-4 month follow-up; referral to hypertension clinic.     Signed, Jasmine MICAEL Clay, MD, MS Jasmine Castaneda, M.D., M.S. Interventional Chartered certified accountant  Pager # [phone]

## 2024-03-03 NOTE — Patient Instructions (Addendum)
 Medication Instructions:  Increase Amlodipine 10 mg daily  Increase Hydrochlorothiazide 25 mg daily   *If you need a refill on your cardiac medications before your next appointment, please call your pharmacy*   Lab Work: BMP today  BMP in one week   If you have labs (blood work) drawn today and your tests are completely normal, you will receive your results only by: MyChart Message (if you have MyChart) OR A paper copy in the mail If you have any lab test that is abnormal or we need to change your treatment, we will call you to review the results.   Testing/Procedures: 1) Your physician has requested that you have an echocardiogram. Echocardiography is a painless test that uses sound waves to create images of your heart. It provides your doctor with information about the size and shape of your heart and how well your heart's chambers and valves are working. This procedure takes approximately one hour. There are no restrictions for this procedure. Please do NOT wear cologne, perfume, aftershave, or lotions (deodorant is allowed). Please arrive 15 minutes prior to your appointment time.  Please note: We ask at that you not bring children with you during ultrasound (echo/ vascular) testing. Due to room size and safety concerns, children are not allowed in the ultrasound rooms during exams. Our front office staff cannot provide observation of children in our lobby area while testing is being conducted. An adult accompanying a patient to their appointment will only be allowed in the ultrasound room at the discretion of the ultrasound technician under special circumstances. We apologize for any inconvenience.    2) Your physician has requested that you have a renal artery duplex. During this test, an ultrasound is used to evaluate blood flow to the kidneys. Allow one hour for this exam. Do not eat after midnight the day before and avoid carbonated beverages. Take your medications as you usually  do.    Follow-Up: At [facility], you and your health needs are our priority.  As part of our continuing mission to provide you with exceptional heart care, we have created designated Provider Care Teams.  These Care Teams include your primary Cardiologist (physician) and Advanced Practice Providers (APPs -  Physician Assistants and Nurse Practitioners) who all work together to provide you with the care you need, when you need it.     Your next appointment:   3 month(s)  The format for your next appointment:   In Person  Provider:   Alm Clay, MD   Other Instructions   You have been referred to  [facility] ( [facility]) if appointment is more than 2 month ,t hen schedule with [facility] - pharmacist for hypertension

## 2024-03-04 ENCOUNTER — Ambulatory Visit: Payer: Self-pay | Admitting: Cardiology

## 2024-03-04 ENCOUNTER — Encounter: Payer: Self-pay | Admitting: Cardiology

## 2024-03-04 DIAGNOSIS — R93429 Abnormal radiologic findings on diagnostic imaging of unspecified kidney: Secondary | ICD-10-CM

## 2024-03-04 DIAGNOSIS — I10 Essential (primary) hypertension: Secondary | ICD-10-CM

## 2024-03-04 DIAGNOSIS — I119 Hypertensive heart disease without heart failure: Secondary | ICD-10-CM | POA: Insufficient documentation

## 2024-03-04 LAB — BASIC METABOLIC PANEL WITH GFR
BUN/Creatinine Ratio: 10 (ref 9–23)
BUN: 8 mg/dL (ref 6–24)
CO2: 20 mmol/L (ref 20–29)
Calcium: 9.8 mg/dL (ref 8.7–10.2)
Chloride: 104 mmol/L (ref 96–106)
Creatinine, Ser: 0.78 mg/dL (ref 0.57–1.00)
Glucose: 78 mg/dL (ref 70–99)
Potassium: 4.4 mmol/L (ref 3.5–5.2)
Sodium: 139 mmol/L (ref 134–144)
eGFR: 98 mL/min/{1.73_m2} (ref >59)

## 2024-03-04 NOTE — Assessment & Plan Note (Signed)
 LVH on EKG.  Likely related to hypertension. -Check 2D echo.

## 2024-03-04 NOTE — Assessment & Plan Note (Signed)
 Followed by PCP/OB. Not currently on any medication for this besides Wegovy for weight loss. With this diagnosis, next viable medication for hypertension management would be spironolactone.

## 2024-03-04 NOTE — Assessment & Plan Note (Signed)
 Chronic hypertension with LVH on EKG.  Diastolic BP 120-130 mmHg.  PCOS likely contributing.   Current regimen insufficient. Discussed BP management to prevent cardiac damage. - Increase amlodipine to 10 mg daily. - Increase hydrochlorothiazide to 25 mg daily. - Order echocardiogram to assess cardiac function and LVH. - Order renal artery ultrasound to evaluate for renal artery stenosis. - Obtain baseline kidney function tests and repeat in two weeks post-medication adjustment. - Likely next medication would be spironolactone-likely start at 25 mg, followed by [facility] - Refer to hypertension clinic for medication titration and management. - Advise taking antihypertensives post-breakfast to prevent dehydration-induced hypotension.

## 2024-03-17 ENCOUNTER — Encounter: Payer: Self-pay | Admitting: *Deleted

## 2024-03-18 ENCOUNTER — Ambulatory Visit (INDEPENDENT_AMBULATORY_CARE_PROVIDER_SITE_OTHER): Admitting: Cardiovascular Disease

## 2024-03-18 ENCOUNTER — Ambulatory Visit: Admitting: Obstetrics and Gynecology

## 2024-03-18 ENCOUNTER — Ambulatory Visit (HOSPITAL_BASED_OUTPATIENT_CLINIC_OR_DEPARTMENT_OTHER): Admitting: Certified Nurse Midwife

## 2024-03-18 ENCOUNTER — Encounter (HOSPITAL_BASED_OUTPATIENT_CLINIC_OR_DEPARTMENT_OTHER): Payer: Self-pay | Admitting: Cardiovascular Disease

## 2024-03-18 ENCOUNTER — Encounter (HOSPITAL_BASED_OUTPATIENT_CLINIC_OR_DEPARTMENT_OTHER): Payer: Self-pay | Admitting: Certified Nurse Midwife

## 2024-03-18 VITALS — BP 156/99 | HR 77 | Ht 68.0 in | Wt 186.8 lb

## 2024-03-18 VITALS — BP 156/102 | HR 74 | Ht 68.0 in | Wt 186.0 lb

## 2024-03-18 DIAGNOSIS — E282 Polycystic ovarian syndrome: Secondary | ICD-10-CM

## 2024-03-18 DIAGNOSIS — I10 Essential (primary) hypertension: Secondary | ICD-10-CM | POA: Diagnosis not present

## 2024-03-18 DIAGNOSIS — Z30431 Encounter for routine checking of intrauterine contraceptive device: Secondary | ICD-10-CM | POA: Diagnosis not present

## 2024-03-18 DIAGNOSIS — I119 Hypertensive heart disease without heart failure: Secondary | ICD-10-CM | POA: Diagnosis not present

## 2024-03-18 MED ORDER — ESCITALOPRAM OXALATE 10 MG PO TABS: 10.0000 mg | ORAL_TABLET | Freq: Every day | ORAL | 1 refills | Status: AC

## 2024-03-18 NOTE — Patient Instructions (Addendum)
 Medication Instructions:  RESUME AMLODIPINE  AND hydrochlorothiazide  DAILY   START LEXAPRO  10 MG DAILY ADDITIONAL REFILLS WILL NEED TO COME FROM YOUR PRIMARY CARE   Labwork: RENIN/ALDOSTERONE/TSH FIRST THING IN MORNING   Testing/Procedures: NONE   Follow-Up: 2 MONTHS WITH DR [place] OR CAITLIN W NP   If you need a refill on your cardiac medications before your next appointment, please call your pharmacy.  MONITOR YOUR BLOOD PRESSURE TWICE A DAY, LOG IN THE BOOK PROVIDED. BRING THE BOOK AND YOUR BLOOD PRESSURE MACHINE TO YOUR FOLLOW UP IN 1 MONTH   Exercise recommendations: The American Heart Association recommends 150 minutes of moderate intensity exercise weekly. Try 30 minutes of moderate intensity exercise 4-5 times per week. This could include walking, jogging, or swimming.

## 2024-03-18 NOTE — Progress Notes (Signed)
 Subjective:     Jasmine Castaneda is a 43 y.o. female Married G1P1 (41yo healthy daughter) here for IUD check. Pt states she and spouse were sexually active recently and he felt the strings and they were uncomfortable.    The following portions of the patient's history were reviewed and updated as appropriate: allergies, current medications, past family history, past medical history, past social history, past surgical history, and problem list.   Review of Systems Pertinent items are noted in HPI.    Objective:    BP (!) 156/99   Pulse 77   Ht 5' 8 (1.727 m) Comment: Reported  Wt 186 lb 12.8 oz (84.7 kg)   LMP  (LMP Unknown) Comment: IUD  BMI 28.40 kg/m  General appearance: alert, cooperative, and appears stated age Pelvic: cervix normal in appearance, external genitalia normal, no adnexal masses or tenderness, no cervical motion tenderness, rectovaginal septum normal, uterus normal size, shape, and consistency, vagina normal without discharge, and IUD strings visible and palpable 3cm.    Assessment:    Surveillance Mirena  IUD.    Plan:    RTO January 2026 for annual GYN exam and prn if issues arise.  Arland MARLA Roller

## 2024-03-18 NOTE — Progress Notes (Signed)
 Advanced Hypertension Clinic Initial Assessment:    Date:  03/18/2024   ID:  Jasmine Castaneda, DOB [birth year], MRN [id number]  PCP:  Catalina Bare, MD  Cardiologist:  Alm Clay, MD  Nephrologist:  Referring MD: Clay Alm ORN, MD   CC: Hypertension  History of Present Illness:    Jasmine Castaneda is a 43 y.o. female with a hx of hypertension, PCOS, hypertriglyceridemia here to establish care in the Advanced Hypertension Clinic.  She saw Dr. Clay 02/2024 for uncontrolled hypertension.  At the time she was on amlodipine  and hydrochlorothiazide .  Diastolic blood pressures were in the 120s to 130s.  She attributed her hypertension to stress and PCOS.  There was evidence of LVH on her EKG.  Amlodipine  and hydrochlorothiazide  were both increased.  She was referred for an echo and renal artery Dopplers but neither have been performed yet.  She has a history of PCOS and obesity.  She was started on Wegovy.  Discussed the use of AI scribe software for clinical note transcription with the patient, who gave verbal consent to proceed.  History of Present Illness [name] has been experiencing hypertension for the past three years, with blood pressure readings often over 150/106 mmHg. She attributes this to stress following the deaths of her uncle and grandfather. She monitors her blood pressure at home using an arm cuff and notes improvement with weight loss and medication, specifically hydrochlorothiazide  and amlodipine . She has temporarily stopped her medications to seek further evaluation.  She has a history of hypothyroidism for about three years, with thyroid function last checked in 2021 and found to be normal. She is concerned about the impact of her medications on her kidneys and heart.  She has polycystic ovary syndrome (PCOS) and has been on [facility] for weight management. She experienced significant weight gain, reaching 199 pounds, but has recently lost weight,  now in the 180s. 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  She experiences chest pain and pressure, particularly during stressful events, and has felt out of breath over the last year. These symptoms do not occur with physical activity. She follows a pescatarian diet, having been a vegetarian for seven years prior. She avoids salt and uses alternatives like Mrs. Dash and Himalayan salt. She does not consume caffeine or smoke, and drinks alcohol socially.  She uses various supplements, including a tea tincture with lavender, chamomile, rose petals, melanin, ashwagandha drops, and St. John's Wort. She also takes zinc for immune support.  She has a history of muscle spasms and cramps, for which she takes tizanidine. She uses ibuprofen  for pain.   Previous antihypertensives:    Past Medical History:  Diagnosis Date   Abnormal Pap smear    leep   Acute pain    Asthma    Family history of breast cancer    Family history of cancer of pituitary gland and craniopharyngeal duct    Family history of colon cancer    Family history of ovarian cancer    Family history of prostate cancer    Family history of uterine cancer    HTN (hypertension)    Hypertension    Hypertriglyceridemia    Infertility    Obesity    PCOS (polycystic ovarian syndrome)    Polycystic ovary    Positive RPR test 06/11/2019   Repeat neg January 2021   Prediabetes    Sinusitis    Temporal  headache    Thoracic spine pain    TMJ (dislocation of temporomandibular joint)    Viral syndrome    Vitamin D deficiency     Past Surgical History:  Procedure Laterality Date   LEEP  2006    Current Medications: Current Meds  Medication Sig   amLODipine  (NORVASC ) 10 MG tablet Take 1 tablet (10 mg total) by mouth daily.   clindamycin  (CLEOCIN ) 300 MG capsule Take 1 capsule (300 mg total) by mouth in the morning and at bedtime. (Patient taking differently: Take  300 mg by mouth in the morning and at bedtime. AS NEEDED)   diphenhydrAMINE  (BENADRYL ) 25 mg capsule Take 25 mg by mouth every 6 (six) hours as needed for allergies.   escitalopram  (LEXAPRO ) 10 MG tablet Take 1 tablet (10 mg total) by mouth daily.   hydrochlorothiazide  (HYDRODIURIL ) 25 MG tablet Take 1 tablet (25 mg total) by mouth daily.   ibuprofen  (ADVIL ) 100 MG/5ML suspension Take 200 mg by mouth every 4 (four) hours as needed.   Multiple Vitamin (MULTIVITAMIN WITH MINERALS) TABS tablet Take 1 tablet by mouth daily.   Probiotic Product (PROBIOTIC PO) Take 1 capsule by mouth daily.   WEGOVY 0.25 MG/0.5ML SOAJ Inject 0.25 mg into the skin once a week.     Allergies:   Septra [bactrim], Sulfamethoxazole-trimethoprim, and Metronidazole    Social History   Socioeconomic History   Marital status: Married    Spouse name: Not on file   Number of children: 1   Years of education: Not on file   Highest education level: Not on file  Occupational History   Occupation: Stylist  Tobacco Use   Smoking status: Never   Smokeless tobacco: Never  Vaping Use   Vaping status: Never Used  Substance and Sexual Activity   Alcohol use: Yes    Comment: social, rare   Drug use: No   Sexual activity: Yes    Birth control/protection: None  Other Topics Concern   Not on file  Social History Narrative   Not on file   Social Drivers of Health   Financial Resource Strain: Low Risk  (03/18/2024)   Overall Financial Resource Strain (CARDIA)    Difficulty of Paying Living Expenses: Not hard at all  Food Insecurity: No Food Insecurity (03/18/2024)   Hunger Vital Sign    Worried About Running Out of Food in the Last Year: Never true    Ran Out of Food in the Last Year: Never true  Transportation Needs: No Transportation Needs (09/27/2023)   PRAPARE - Administrator, Civil Service (Medical): No    Lack of Transportation (Non-Medical): No  Physical Activity: Inactive (03/18/2024)   Exercise Vital  Sign    Days of Exercise per Week: 0 days    Minutes of Exercise per Session: 0 min  Stress: Stress Concern Present (03/18/2024)   [employer] of Occupational Health - Occupational Stress Questionnaire    Feeling of Stress: Very much  Social Connections: Moderately Isolated (03/18/2024)   Social Connection and Isolation Panel    Frequency of Communication with Friends and Family: More than three times a week    Frequency of Social Gatherings with Friends and Family: Once a week    Attends Religious Services: Never    Database administrator or Organizations: No    Attends Engineer, structural: Never    Marital Status: Married     Family History: The patient's family history includes Breast cancer in her  mother; Cancer in her mother and paternal grandfather; Colon cancer in her maternal uncle; Cystic fibrosis in her mother; Diabetes in her maternal grandfather, maternal grandmother, paternal grandfather, and paternal grandmother; Hypertension in her father; Irritable bowel syndrome in her paternal aunt; Kidney disease in her maternal uncle; Ovarian cancer in her maternal great-grandmother, mother, and other family members; Prostate cancer in her maternal uncle; Prostate cancer (age of onset: 49) in her maternal grandfather; Ulcerative colitis in her mother; Uterine cancer in her mother.  ROS:   Please see the history of present illness.     All other systems reviewed and are negative.  EKGs/Labs/Other Studies Reviewed:    EKG:  EKG is not ordered today.  EKG Interpretation Date/Time:    Ventricular Rate:    PR Interval:    QRS Duration:    QT Interval:    QTC Calculation:   R Axis:      Text Interpretation:         Recent Labs: 10/30/2023: ALT 13; Hemoglobin 13.2; Platelets 314 03/03/2024: BUN 8; Creatinine, Ser 0.78; Potassium 4.4; Sodium 139   Recent Lipid Panel    Component Value Date/Time   CHOL 184 10/30/2023 0933   TRIG 108 10/30/2023 0933   HDL 98  10/30/2023 0933   CHOLHDL 1.9 10/30/2023 0933   LDLCALC 67 10/30/2023 0933    Physical Exam:   VS:  BP (!) 156/102   Pulse 74   Ht 5' 8 (1.727 m)   Wt 186 lb (84.4 kg)   LMP  (LMP Unknown) Comment: IUD  SpO2 99%   BMI 28.28 kg/m  , BMI Body mass index is 28.28 kg/m. GENERAL:  Well appearing HEENT: Pupils equal round and reactive, fundi not visualized, oral mucosa unremarkable NECK:  No jugular venous distention, waveform within normal limits, carotid upstroke brisk and symmetric, no bruits, no thyromegaly LUNGS:  Clear to auscultation bilaterally HEART:  RRR.  PMI not displaced or sustained,S1 and S2 within normal limits, no S3, no S4, no clicks, no rubs, no murmurs ABD:  Flat, positive bowel sounds normal in frequency in pitch, no bruits, no rebound, no guarding, no midline pulsatile mass, no hepatomegaly, no splenomegaly EXT:  2 plus pulses throughout, no edema, no cyanosis no clubbing SKIN:  No rashes no nodules NEURO:  Cranial nerves II through XII grossly intact, motor grossly intact throughout PSYCH:  Cognitively intact, oriented to person place and time   ASSESSMENT/PLAN:    Assessment & Plan # Hypertension with left ventricular hypertrophy Hypertension with elevated diastolic and systolic pressures. Left ventricular hypertrophy on EKG. Hydrochlorothiazide  effective in reducing blood pressure. Stress, anxiety, weight gain, and lack of exercise are contributing factors. - Restart hydrochlorothiazide  and amlodipine .  She has not experienced any intolerance thus far. - Order AM labs for renin, aldosterone, and TSH. - Schedule renal artery Doppler - Encourage at least 150 minutes of cardio exercise per week. - Limit sodium to <2300 mg/day - Provide blood pressure tracking book. - Discuss potential use of Lexapro  for stress and anxiety management. - Stop St. John's Wort  # Obesity Obesity with weight reduction from 199 lbs to 180 lbs. Lack of formal exercise and stress  are contributing factors. - Encourage 150 minutes of cardio exercise per week. - Discuss dietary modifications and continued weight management. - She was congratulated on her weight loss  # Stress and anxiety Significant stress and anxiety contributing to hypertension. Current use of herbal supplements. SSRIs like Lexapro  are a safe option for  managing chronic stress and anxiety. - Start Lexapro  10mg  daily for stress and anxiety management. - Encourage continued therapy sessions. - Consider referral to psychiatrist for medication management.      Screening for Secondary Hypertension:     03/18/2024   11:07 AM  Causes  Drugs/Herbals Screened     - Comments limits salt.  Pescatarian.  No caffeine.  Social EtOH.  No tob. + St. John's wart.  +NSAIDs  Renovascular HTN Screened     - Comments renal Dopplers pending  Sleep Apnea Screened  Thyroid Disease Not Screened  Hyperaldosteronism Screened  Pheochromocytoma N/A  Cushing's Syndrome N/A  Hyperparathyroidism Screened  Coarctation of the Aorta Screened     - Comments BP symmetric  Compliance Screened     - Comments Not currently taking medication as prescribed    Relevant Labs/Studies:    Latest Ref Rng & Units 03/03/2024   12:54 PM 10/30/2023    9:33 AM 04/04/2016    5:06 PM  Basic Labs  Sodium 134 - 144 mmol/L 139  139    Potassium 3.5 - 5.2 mmol/L 4.4  4.5    Creatinine 0.57 - 1.00 mg/dL 9.21  9.17  9.07        Latest Ref Rng & Units 09/29/2019    4:59 PM 12/06/2016    9:57 AM  Thyroid   TSH 0.450 - 4.500 uIU/mL 0.477  0.609                 03/03/2024   12:22 PM  Renovascular   Renal Artery US  Completed Yes    Disposition:    FU with MD/PharmD in 2 months    Medication Adjustments/Labs and Tests Ordered: Current medicines are reviewed at length with the patient today.  Concerns regarding medicines are outlined above.  Orders Placed This Encounter  Procedures   TSH   Aldosterone + renin activity w/ ratio    Meds ordered this encounter  Medications   escitalopram  (LEXAPRO ) 10 MG tablet    Sig: Take 1 tablet (10 mg total) by mouth daily.    Dispense:  90 tablet    Refill:  1     Signed, Annabella Scarce, MD  03/18/2024 1:21 PM    [facility]

## 2024-04-11 ENCOUNTER — Ambulatory Visit: Admitting: Pharmacist

## 2024-04-21 ENCOUNTER — Ambulatory Visit (HOSPITAL_BASED_OUTPATIENT_CLINIC_OR_DEPARTMENT_OTHER)
Admission: RE | Admit: 2024-04-21 | Discharge: 2024-04-21 | Disposition: A | Discharge status: Home / Self Care | Source: Ambulatory Visit | Attending: Cardiology | Admitting: Cardiology

## 2024-04-21 ENCOUNTER — Ambulatory Visit (HOSPITAL_COMMUNITY)
Admission: RE | Admit: 2024-04-21 | Discharge: 2024-04-21 | Disposition: A | Discharge status: Home / Self Care | Source: Ambulatory Visit | Attending: Cardiology | Admitting: Cardiology

## 2024-04-21 DIAGNOSIS — Z7689 Persons encountering health services in other specified circumstances: Secondary | ICD-10-CM | POA: Diagnosis present

## 2024-04-21 DIAGNOSIS — I10 Essential (primary) hypertension: Secondary | ICD-10-CM | POA: Diagnosis not present

## 2024-04-21 LAB — ECHOCARDIOGRAM COMPLETE
AR max vel: 1.48 cm2
AV Area VTI: 1.46 cm2
AV Area mean vel: 1.4 cm2
AV Mean grad: 4 mmHg
AV Peak grad: 6.6 mmHg
Ao pk vel: 1.28 m/s
Area-P 1/2: 4.21 cm2
S' Lateral: 2.78 cm

## 2024-04-24 ENCOUNTER — Telehealth: Payer: Self-pay | Admitting: Podiatry

## 2024-04-24 NOTE — Telephone Encounter (Signed)
 DOS- 05/07/2024  [place] BUNIONECTOMY + LEG BLOCK RT- [id number]  [facility] EFFECTIVE DATE- 03/11/2024  PER FAX RECEIVED FROM [facility], NO PRIOR AUTH IS REQUIRED FOR CPT CODE 71703. DOCUMENTATION ATTACHED TO SURGERY CONSENT PACKET.

## 2024-05-02 NOTE — Telephone Encounter (Signed)
 I actually just talked to one of our vascular surgeons.  He recommended that we check a urinalysis to look for either microscopic blood or protein in the urine.  This would be something that we would check intermittently.  As long as there is no evidence of either blood or protein in the urine, then we do not need to do additional testing.  If there is evidence of blood or a protein in the urine, we would do a CT scan of the abdomen with contrast and put in a referral to vascular surgery to see what else needs to be done.  Reena - ? We can go ahead & order UA.  When you return for follow-up, if blood pressure still elevated, the recommended medications to treat would be either something like an ACE inhibitor or an ARB.   Alm Clay, MD

## 2024-05-05 ENCOUNTER — Ambulatory Visit: Admitting: Advanced Practice Midwife

## 2024-05-05 ENCOUNTER — Telehealth: Payer: Self-pay | Admitting: Podiatry

## 2024-05-05 NOTE — Telephone Encounter (Signed)
 Checked with surgery center about her surgery and they said they canceled it.  I called pt and pt had a echo and there were some abnormality and she is wanting to hold off on surgery to get that cleared before going under any anesthesia.

## 2024-05-07 NOTE — Addendum Note (Signed)
 Addended by: GLADIS REENA GAILS on: 05/07/2024 12:44 PM   Modules accepted: Orders

## 2024-05-07 NOTE — Telephone Encounter (Signed)
 Late entry -- spoke to patient on 05/06/24  result given . Patient requested to send her the inforamtion so she camn read it for herself.   Result note has been sent patient's mychart contact   Patient is agreeable to have U/A completed prior to her nrxt office visit  Order placed

## 2024-05-13 ENCOUNTER — Encounter: Admitting: Podiatry

## 2024-05-20 ENCOUNTER — Encounter: Payer: Self-pay | Admitting: Cardiology

## 2024-05-20 ENCOUNTER — Ambulatory Visit: Attending: Cardiology | Admitting: Cardiology

## 2024-05-20 VITALS — BP 116/86 | HR 84 | Ht 68.0 in | Wt 186.4 lb

## 2024-05-20 DIAGNOSIS — R600 Localized edema: Secondary | ICD-10-CM | POA: Insufficient documentation

## 2024-05-20 DIAGNOSIS — I871 Compression of vein: Secondary | ICD-10-CM | POA: Insufficient documentation

## 2024-05-20 DIAGNOSIS — I119 Hypertensive heart disease without heart failure: Secondary | ICD-10-CM | POA: Diagnosis present

## 2024-05-20 DIAGNOSIS — I10 Essential (primary) hypertension: Secondary | ICD-10-CM | POA: Diagnosis present

## 2024-05-20 NOTE — Progress Notes (Signed)
 Cardiology Office Note:  .   Date:  05/20/2024  ID:  Jasmine Castaneda, DOB [birth year], MRN [id number] PCP: Jasmine Bare, MD  [facility] Providers Cardiologist:  Jasmine Clay, MD     Chief Complaint  Patient presents with   Follow-up    To discuss test results   Hypertension    Better control.  Has a headache    Patient Profile: .     Jasmine Castaneda is a relatively healthy appearing 43 y.o. female non-smoker with a PMH notable for HTN, triglycerides and PCOS who presents here for 20-month follow-up to discuss test results.  She returns at the request of Jasmine Bare, MD.     I initially saw Jasmine Castaneda in consultation on March 03, 2024 for difficulty control hypertension.  She was noticing headaches and diplopia as well as some dyspnea.  Was concerned how much this had to do with hypertension versus other issues.  With the EKG suggesting LVH and high blood pressures, we chose to evaluate with an echocardiogram, and I referred her to hypertension clinic with Jasmine Castaneda.  She now presents to discuss results of her studies (echocardiogram and renal artery Doppler.  She was just seen by Jasmine Castaneda on July 8 for hypertension management: Restarted hydralazine and amlodipine .  Suggested limiting sodium.  Discussed Lexapro .  Encouraged increased cardio exercise and discussed dietary modifications.  Renin-angiotensin levels checked.  Subjective  Discussed the use of AI scribe software for clinical note transcription with the patient, who gave verbal consent to proceed.  History of Present Illness Jasmine Castaneda is a 43 year old female with hypertension who presents for evaluation of blood pressure management and potential renal vascular issues.  Her blood pressure has been consistently lower, around 125/94 mmHg, compared to previous readings of 150/100 mmHg. She has not experienced headaches in the last few weeks.  She is currently  taking hydrochlorothiazide  (HCTZ) and experiences increased urination, stating she is constantly using the bathroom. She drinks a lot of water to manage this and notices swelling if she does not drink enough.  There has been discussion of Nutcracker syndrome, a condition where a vein may be pinched between the aorta and the superior mesenteric artery. She has not yet completed the recommended renal aldosterone blood work and urinalysis due to scheduling conflicts with her work.  No shortness of breath, heart racing, or significant swelling, although she notes some swelling that improves with increased water intake.  Cardiovascular ROS: no chest pain or dyspnea on exertion negative for - edema, irregular heartbeat, orthopnea, palpitations, paroxysmal nocturnal dyspnea, rapid heart rate, shortness of breath, or syncope or near syncope, TIA or amaurosis fugax, claudication  ROS:  Review of Systems - Negative except headache, also constantly having to drink/feeling thirsty -> notes that edema happens if she does not drink.    Objective   Current Meds  Medication Sig   amLODipine  (NORVASC ) 10 MG tablet Take 1 tablet (10 mg total) by mouth daily.   clindamycin  (CLEOCIN ) 300 MG capsule Take 1 capsule (300 mg total) by mouth in the morning and at bedtime.   diphenhydrAMINE  (BENADRYL ) 25 mg capsule Take 25 mg by mouth every 6 (six) hours as needed for allergies.   escitalopram  (LEXAPRO ) 10 MG tablet Take 1 tablet (10 mg total) by mouth daily.   hydrochlorothiazide  (HYDRODIURIL ) 25 MG tablet Take 1 tablet (25 mg total) by mouth daily.   Multiple Vitamin (MULTIVITAMIN WITH MINERALS) TABS tablet Take 1  tablet by mouth daily.   Probiotic Product (PROBIOTIC PO) Take 1 capsule by mouth daily.    Studies Reviewed: SABRA        ECHO (May 21, 2024): EF 60 to 65%.  No RWMA.  Normal RV size and function.  Normal atria and valves.  NORMAL STUDY Renal artery Dopplers (2024/05/21): No evidence of renal artery  stenosis.  Right renal flow patent.  Left renal vein exhibits flow into the IVC with evidence of narrowing as it passes between the SMA and aorta suggestive of nutcracker syndrome-recommend further imaging. Discussed with Jasmine Castaneda from vascular surgery who recommended rechecking urinalysis to look for the microscopic blood or protein in the urine.  If present would do a CT scan of the abdomen with contrast and put in referral for vascular surgery to see if anything else needs to be done.  UA checked  Risk Assessment/Calculations:               Physical Exam:   VS:  BP 116/86   Pulse 84   Ht 5' 8 (1.727 m)   Wt 186 lb 6.4 oz (84.6 kg)   SpO2 99%   BMI 28.34 kg/m    Wt Readings from Last 3 Encounters:  05/20/24 186 lb 6.4 oz (84.6 kg)  03/18/24 186 lb 12.8 oz (84.7 kg)  03/18/24 186 lb (84.4 kg)      GEN: Well nourished, well developed in no acute distress; healthy-appearing NECK: No JVD; No carotid bruits CARDIAC: Normal S1, S2; RRR, no murmurs, rubs, gallops RESPIRATORY:  Clear to auscultation without rales, wheezing or rhonchi ; nonlabored, good air movement. ABDOMEN: Soft, non-tender, non-distended EXTREMITIES:  No edema; No deformity      ASSESSMENT AND PLAN: .    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  No kidney damage or significant symptoms. Further evaluation required. - Refer to vascular surgeon if urinalysis shows blood or protein.  - Intermittent (at least twice a year) evaluation and urinalysis to ensure no hematuria or proteinuria. - Order CT scan of aorta with contrast if urinalysis indicates nutcracker syndrome. - Discuss potential surgical interventions if necessary.      LVH (left ventricular hypertrophy) due to hypertensive disease, without heart failure  (Chronic)   LVH noted on EKG with notable hypertension, but normal thickness on echo.  No signs of heart failure.      Uncontrolled hypertension - Primary (Chronic)   Hypertension controlled with current medications => further adjustments by Dr. Raford: - Continue 10 mg amlodipine  along with 25 mg HCTZ. - Continue on Wegovy  - Order renal aldosterone blood test and urinalysis. - Continue follow-up visit with Dr. Raford (would be happy to continue to follow her for other issues, but her main concern is the hypertension which could be followed by Dr. Raford as primary.  Will defer to HTN clinic/Dr. Raford)  - Continues to take Lexapro  for anxiety      Relevant Orders   Urinalysis, Routine w reflex microscopic     Other   Lower extremity edema   Edema improved with diuretic therapy, likely due to fluid retention. Frequent urination aids management. - Continue diuretic therapy. - Encourage adequate water intake.            Follow-Up: Return for Followup when necessary depending  on visit with Dr. Raford and plans for follow-up.SABRA Fus, Jasmine MICAEL Clay, MD, MS Jasmine Castaneda, M.D., M.S. Interventional Cardiologist  [facility] Pager # [phone]

## 2024-05-20 NOTE — Assessment & Plan Note (Signed)
 Edema improved with diuretic therapy, likely due to fluid retention. Frequent urination aids management. - Continue diuretic therapy. - Encourage adequate water intake.

## 2024-05-20 NOTE — Assessment & Plan Note (Addendum)
 Hypertension controlled with current medications => further adjustments by Dr. Raford: - Continue 10 mg amlodipine  along with 25 mg HCTZ. - Continue on Wegovy  - Order renal aldosterone blood test and urinalysis. - Continue follow-up visit with Dr. Raford (would be happy to continue to follow her for other issues, but her main concern is the hypertension which could be followed by Dr. Raford as primary.  Will defer to HTN clinic/Dr. Raford)  - Continues to take Lexapro  for anxiety

## 2024-05-20 NOTE — Assessment & Plan Note (Addendum)
 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  No kidney damage or significant symptoms. Further evaluation required. - Refer to vascular surgeon if urinalysis shows blood or protein.  - Intermittent (at least twice a year) evaluation and urinalysis to ensure no hematuria or proteinuria. - Order CT scan of aorta with contrast if urinalysis indicates nutcracker syndrome. - Discuss potential surgical interventions if necessary.

## 2024-05-20 NOTE — Assessment & Plan Note (Signed)
 LVH noted on EKG with notable hypertension, but normal thickness on echo.  No signs of heart failure.

## 2024-05-20 NOTE — Patient Instructions (Signed)
 Medication Instructions:   No changes  *If you need a refill on your cardiac medications before your next appointment, please call your pharmacy*   Lab Work: Urinalysis with microscopic If you have labs (blood work) drawn today and your tests are completely normal, you will receive your results only by: MyChart Message (if you have MyChart) OR A paper copy in the mail If you have any lab test that is abnormal or we need to change your treatment, we will call you to review the results.   Testing/Procedures:  Not needed  Follow-Up: At [facility], you and your health needs are our priority.  As part of our continuing mission to provide you with exceptional heart care, we have created designated Provider Care Teams.  These Care Teams include your primary Cardiologist (physician) and Advanced Practice Providers (APPs -  Physician Assistants and Nurse Practitioners) who all work together to provide you with the care you need, when you need it.     Your next appointment:   As needed form Dr Raford standpoint   The format for your next appointment:   In Person  Provider:   Alm Clay, MD

## 2024-05-22 ENCOUNTER — Ambulatory Visit: Payer: Self-pay | Admitting: Cardiology

## 2024-05-22 ENCOUNTER — Encounter: Admitting: Podiatry

## 2024-05-22 ENCOUNTER — Encounter (HOSPITAL_BASED_OUTPATIENT_CLINIC_OR_DEPARTMENT_OTHER): Payer: Self-pay

## 2024-05-22 ENCOUNTER — Encounter (HOSPITAL_BASED_OUTPATIENT_CLINIC_OR_DEPARTMENT_OTHER): Payer: Self-pay | Admitting: Family

## 2024-05-22 ENCOUNTER — Ambulatory Visit (HOSPITAL_BASED_OUTPATIENT_CLINIC_OR_DEPARTMENT_OTHER): Admitting: Family

## 2024-05-22 VITALS — BP 118/80 | HR 79 | Resp 17 | Ht 68.0 in | Wt 189.0 lb

## 2024-05-22 DIAGNOSIS — R93429 Abnormal radiologic findings on diagnostic imaging of unspecified kidney: Secondary | ICD-10-CM

## 2024-05-22 DIAGNOSIS — E282 Polycystic ovarian syndrome: Secondary | ICD-10-CM | POA: Diagnosis not present

## 2024-05-22 DIAGNOSIS — I871 Compression of vein: Secondary | ICD-10-CM | POA: Diagnosis not present

## 2024-05-22 DIAGNOSIS — Z6828 Body mass index (BMI) 28.0-28.9, adult: Secondary | ICD-10-CM | POA: Diagnosis not present

## 2024-05-22 DIAGNOSIS — I10 Essential (primary) hypertension: Secondary | ICD-10-CM

## 2024-05-22 LAB — URINALYSIS, ROUTINE W REFLEX MICROSCOPIC
Bilirubin, UA: NEGATIVE
Glucose, UA: NEGATIVE
Ketones, UA: NEGATIVE
Leukocytes,UA: NEGATIVE
Nitrite, UA: NEGATIVE
RBC, UA: NEGATIVE
Specific Gravity, UA: 1.028 (ref 1.005–1.030)
Urobilinogen, Ur: 1 mg/dL (ref 0.2–1.0)
pH, UA: 6 (ref 5.0–7.5)

## 2024-05-22 MED ORDER — WEGOVY 0.5 MG/0.5ML ~~LOC~~ SOAJ: 0.5000 mg | SUBCUTANEOUS | 0 refills | Status: DC

## 2024-05-22 MED ORDER — WEGOVY 0.25 MG/0.5ML ~~LOC~~ SOAJ: 0.2500 mg | SUBCUTANEOUS | Status: DC

## 2024-05-22 NOTE — Patient Instructions (Addendum)
 Medication Instructions:   REDUCE hydrochlorothiazide  to 12.5mg  (half of a 25mg  tablet) once per day If you have the 12.5mg  tablet at home, okay to use those instead  CONTINUE Amlodipine  10mg  daily   WE SENT IN semaglutide -weight management (WEGOVY ) 0.5 MG/0.5ML SOAJ SQ injection --Inject 0.5 mg into the skin once a week. FOR 4 WEEKS WHEN YOU FINISH THE 0.25 MG   Reche GORMAN Finder, NP will send you a MyChart message in 2 weeks to check in on your blood pressure, lightheadedness    Follow-Up: Please follow up in 2-3 months in ADV HTN CLINIC with Dr. Raford, Reche Finder, NP or Allean Mink PharmD    Special Instructions:

## 2024-05-22 NOTE — Progress Notes (Signed)
 [facility] Assessment:    Date:  05/22/2024   ID:  Jasmine Castaneda, DOB 11/18/1980, MRN [id number]  PCP:  Catalina Bare, MD  Cardiologist:  Alm Clay, MD  Nephrologist:  Referring MD: Catalina Bare, MD   CC: Hypertension  History of Present Illness:    Jasmine Castaneda is a 43 y.o. female with a hx of hypertension, PCOS, hypertriglyceridemia, hypothyroidism, obesity  Established with [facility] 03/18/24 due to uncontrolled blood pressure on amlodipine  and HCTZ.  She attributed her hypertension to stress and PCOS.  There was evidence LVH on EKG.  Amlodipine  and HCTZ both previously increased.  At initial visit she was noted to be taking various supplements including St. John's wort.  She was advised to stop St. John's wort.  Amlodipine  and HCTZ resumed.  Lexapro  10 mg daily added for stress and anxiety management.  Addendum aldosterone and TSH ordered but not collected.  Renal duplex 04/11/2024 with no renal artery stenosis but abnormal flow in the left renal vein as well as possible plaque in mid aorta. Was recommended for UA for microscopic blood or protein to be checked periodically per discussion with [facility].  UA was unremarkable, recommended for UA at least twice a year to ensure no hematuria nor proteinuria.  Echo 04/21/2024 normal LVEF, no RWMA, no significant valvular abnormalities.  Seen by Dr. Clay 05/20/24. BP was well controlled.   Presents today for follow up.She does note lightheadedness. No near syncope, syncope. Taking all of her medications at 9am. Her goal is for 80 oz of water per day which she gets most days. Taking lexapro  intermittently. Home BP cuff 123/84 and in clinic manual visit 118/80. No longer taking Wegovy  as awaiting appointment with PCP team prior to new presription. Previously tolerated well.    Previous antihypertensives:   Past Medical History:  Diagnosis Date   Abnormal Pap smear    leep   Acute  pain    Asthma    Family history of breast cancer    Family history of cancer of pituitary gland and craniopharyngeal duct    Family history of colon cancer    Family history of ovarian cancer    Family history of prostate cancer    Family history of uterine cancer    HTN (hypertension)    Hypertension    Hypertriglyceridemia    Infertility    Obesity    PCOS (polycystic ovarian syndrome)    Polycystic ovary    Positive RPR test 06/11/2019   Repeat neg January 2021   Prediabetes    Sinusitis    Temporal headache    Thoracic spine pain    TMJ (dislocation of temporomandibular joint)    Viral syndrome    Vitamin D deficiency     Past Surgical History:  Procedure Laterality Date   LEEP  2006    Current Medications: Current Meds  Medication Sig   amLODipine  (NORVASC ) 10 MG tablet Take 1 tablet (10 mg total) by mouth daily.   escitalopram  (LEXAPRO ) 10 MG tablet Take 1 tablet (10 mg total) by mouth daily.   hydrochlorothiazide  (HYDRODIURIL ) 25 MG tablet Take 1 tablet (25 mg total) by mouth daily.   Multiple Vitamin (MULTIVITAMIN WITH MINERALS) TABS tablet Take 1 tablet by mouth daily.   Probiotic Product (PROBIOTIC PO) Take 1 capsule by mouth daily.     Allergies:   Septra [bactrim], Sulfamethoxazole-trimethoprim, and Metronidazole    Social History   Socioeconomic History   Marital status: Married  Spouse name: Not on file   Number of children: 1   Years of education: Not on file   Highest education level: Not on file  Occupational History   Occupation: Stylist  Tobacco Use   Smoking status: Never   Smokeless tobacco: Never  Vaping Use   Vaping status: Never Used  Substance and Sexual Activity   Alcohol use: Yes    Comment: social, rare   Drug use: No   Sexual activity: Yes    Birth control/protection: None  Other Topics Concern   Not on file  Social History Narrative   Not on file   Social Drivers of Health   Financial Resource Strain: Low Risk   (03/18/2024)   Overall Financial Resource Strain (CARDIA)    Difficulty of Paying Living Expenses: Not hard at all  Food Insecurity: No Food Insecurity (03/18/2024)   Hunger Vital Sign    Worried About Running Out of Food in the Last Year: Never true    Ran Out of Food in the Last Year: Never true  Transportation Needs: No Transportation Needs (09/27/2023)   PRAPARE - Administrator, Civil Service (Medical): No    Lack of Transportation (Non-Medical): No  Physical Activity: Inactive (03/18/2024)   Exercise Vital Sign    Days of Exercise per Week: 0 days    Minutes of Exercise per Session: 0 min  Stress: Stress Concern Present (03/18/2024)   [employer] of Occupational Health - Occupational Stress Questionnaire    Feeling of Stress: Very much  Social Connections: Moderately Isolated (03/18/2024)   Social Connection and Isolation Panel    Frequency of Communication with Friends and Family: More than three times a week    Frequency of Social Gatherings with Friends and Family: Once a week    Attends Religious Services: Never    Database administrator or Organizations: No    Attends Engineer, structural: Never    Marital Status: Married     Family History: The patient's family history includes Breast cancer in her mother; Cancer in her mother and paternal grandfather; Colon cancer in her maternal uncle; Cystic fibrosis in her mother; Diabetes in her maternal grandfather, maternal grandmother, paternal grandfather, and paternal grandmother; Hypertension in her father; Irritable bowel syndrome in her paternal aunt; Kidney disease in her maternal uncle; Ovarian cancer in her maternal great-grandmother, mother, and other family members; Prostate cancer in her maternal uncle; Prostate cancer (age of onset: 71) in her maternal grandfather; Ulcerative colitis in her mother; Uterine cancer in her mother.  ROS:   Please see the history of present illness.     All other systems  reviewed and are negative.  EKGs/Labs/Other Studies Reviewed:         Recent Labs: 10/30/2023: ALT 13; Hemoglobin 13.2; Platelets 314 03/03/2024: BUN 8; Creatinine, Ser 0.78; Potassium 4.4; Sodium 139   Recent Lipid Panel    Component Value Date/Time   CHOL 184 10/30/2023 0933   TRIG 108 10/30/2023 0933   HDL 98 10/30/2023 0933   CHOLHDL 1.9 10/30/2023 0933   LDLCALC 67 10/30/2023 0933    Physical Exam:   VS:  BP 118/80 (BP Location: Left Arm, Patient Position: Sitting, Cuff Size: Normal)   Pulse 79   Resp 17   Ht 5' 8 (1.727 m)   Wt 189 lb (85.7 kg)   SpO2 98%   BMI 28.74 kg/m  , BMI Body mass index is 28.74 kg/m. GENERAL:  Well appearing HEENT:  Pupils equal round and reactive, fundi not visualized, oral mucosa unremarkable NECK:  No jugular venous distention, waveform within normal limits, carotid upstroke brisk and symmetric, no bruits, no thyromegaly LYMPHATICS:  No cervical adenopathy LUNGS:  Clear to auscultation bilaterally HEART:  RRR.  PMI not displaced or sustained,S1 and S2 within normal limits, no S3, no S4, no clicks, no rubs, no murmurs ABD:  Flat, positive bowel sounds normal in frequency in pitch, no bruits, no rebound, no guarding, no midline pulsatile mass, no hepatomegaly, no splenomegaly EXT:  2 plus pulses throughout, no edema, no cyanosis no clubbing SKIN:  No rashes no nodules NEURO:  Cranial nerves II through XII grossly intact, motor grossly intact throughout PSYCH:  Cognitively intact, oriented to person place and time   ASSESSMENT/PLAN:    HTN - Relatively hypotensive with reported lightheadedness. Home BP cuff found to read +5/+4 compared to manual reading. Continue amlodipine  10mg  daily Reduce hydrochlorothiazide  from 25mg  to 12.5mg  daily MyChart message in 2 weeks to check in  Discussed to monitor BP at home at least 2 hours after medications and sitting for 5-10 minutes.  Secondary workup 05/20/24 normal TSH 05/20/24 renin-aldosterone  ratio pending 04/21/24 renal artery duplex no stenosis  PCOS / BMI 28 - Weight loss via diet and exercise encouraged. Discussed the impact being overweight would have on cardiovascular risk. Given information on Right Start exercise program. Previous success with Wegovy , Rx Wegovy  0.25mg  weekly x 4 weeks ? Wegovy  0.5mg  weekly. Sample of 0.25mg  dose provided in clinic. Anticipate continue to up-titrate at future follow up.   Anxiety / Depression - Encouraged to take Lexapro  10mg  daily as prescribed. Discussed need to take routinely for efficacy.  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 No pelvic nor abdominal pain. Per prior notes by Dr. Anner, plan for intermittent (q95mos) UA to assess for hematuria, proteinuria and CT aorta with contrast and referral to VVS if noted.    Screening for Secondary Hypertension:     03/18/2024   11:07 AM  Causes  Drugs/Herbals Screened     - Comments limits salt.  Pescatarian.  No caffeine.  Social EtOH.  No tob. + St. John's wart.  +NSAIDs  Renovascular HTN Screened     - Comments renal Dopplers pending  Sleep Apnea Screened  Thyroid Disease Not Screened  Hyperaldosteronism Screened  Pheochromocytoma N/A  Cushing's Syndrome N/A  Hyperparathyroidism Screened  Coarctation of the Aorta Screened     - Comments BP symmetric  Compliance Screened     - Comments Not currently taking medication as prescribed    Relevant Labs/Studies:    Latest Ref Rng & Units 03/03/2024   12:54 PM 10/30/2023    9:33 AM 04/04/2016    5:06 PM  Basic Labs  Sodium 134 - 144 mmol/L 139  139    Potassium 3.5 - 5.2 mmol/L 4.4  4.5    Creatinine 0.57 - 1.00 mg/dL 9.21  9.17  9.07        Latest Ref Rng & Units 09/29/2019    4:59 PM 12/06/2016    9:57 AM  Thyroid   TSH 0.450 - 4.500 uIU/mL 0.477  0.609                 04/21/2024   11:41 AM  Renovascular   Renal Artery US  Completed Yes      Disposition:    FU with MD/APP/PharmD in 2-3 months  Medication Adjustments/Labs and Tests Ordered: Current medicines are reviewed at length with the patient today.  Concerns regarding medicines are outlined above.  No orders of the defined types were placed in this encounter.  No orders of the defined types were placed in this encounter.    Signed, Reche GORMAN Finder, NP  05/22/2024 4:19 PM    [facility]

## 2024-05-23 ENCOUNTER — Other Ambulatory Visit (HOSPITAL_COMMUNITY): Payer: Self-pay

## 2024-05-23 ENCOUNTER — Telehealth: Payer: Self-pay

## 2024-05-23 NOTE — Telephone Encounter (Signed)
-----   Message from Nurse Porter HERO sent at 05/22/2024  4:43 PM EDT ----- Regarding: PA FOR WEGOVY  0.5 MG PER CAITLIN WALKER, NP Reche Finder, NP saw this pt in HTN Clinic today and sent in semaglutide -(WEGOVY ) 0.5 MG/0.5ML SOAJ SQ injection to her pharmacy  She wanted me to reach out to you guys to get the PA on this for the pt--she was previously on 0.25 mg dose so this will be an increase from that dose after she completes the 0.25 mg  Can you please assist with this?  Thanks, [employer]

## 2024-05-23 NOTE — Telephone Encounter (Signed)
 Pharmacy Patient Advocate Encounter  Insurance verification completed.   The patient is insured through [employer]   Ran test claim for WEGOVY  .5. Currently a quantity of 2 ML is a 28 day supply and the co-pay is $4 . The current 28 day co-pay is, $4.  No PA needed at this time.  This test claim was processed through [facility]- copay amounts may vary at other pharmacies due to pharmacy/plan contracts, or as the patient moves through the different stages of their insurance plan.

## 2024-05-26 LAB — TSH: TSH: 0.607 u[IU]/mL (ref 0.450–4.500)

## 2024-05-26 LAB — ALDOSTERONE + RENIN ACTIVITY W/ RATIO
Aldos/Renin Ratio: 20.5 (ref 0.0–30.0)
Aldosterone: 18.2 ng/dL (ref 0.0–30.0)
Renin Activity, Plasma: 0.888 ng/mL/h (ref 0.167–5.380)

## 2024-06-05 ENCOUNTER — Encounter: Admitting: Podiatry

## 2024-06-22 ENCOUNTER — Ambulatory Visit: Payer: Self-pay | Admitting: Cardiovascular Disease

## 2024-06-22 DIAGNOSIS — R899 Unspecified abnormal finding in specimens from other organs, systems and tissues: Secondary | ICD-10-CM

## 2024-06-22 DIAGNOSIS — I10 Essential (primary) hypertension: Secondary | ICD-10-CM

## 2024-06-23 NOTE — Progress Notes (Unsigned)
 [facility] [place]  ASSESSMENT / PLAN: 43 y.o. 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   CHIEF COMPLAINT: ***  HISTORY OF PRESENT ILLNESS: Jasmine Castaneda is a 43 y.o. female ***  VASCULAR SURGICAL HISTORY: ***  VASCULAR RISK FACTORS: {FINDINGS; POSITIVE NEGATIVE:5305437611} history of stroke / transient ischemic attack. {FINDINGS; POSITIVE NEGATIVE:5305437611} history of coronary artery disease. *** history of PCI. *** history of CABG.  {FINDINGS; POSITIVE NEGATIVE:5305437611} history of diabetes mellitus. Last A1c ***. {FINDINGS; POSITIVE NEGATIVE:5305437611} history of smoking. *** actively smoking. {FINDINGS; POSITIVE NEGATIVE:5305437611} history of hypertension. *** drug regimen with *** control. {FINDINGS; POSITIVE NEGATIVE:5305437611} history of chronic kidney disease.  Last GFR ***. CKD {stage:30421363}. {FINDINGS; POSITIVE NEGATIVE:5305437611} history of chronic obstructive pulmonary disease, treated with ***.  FUNCTIONAL STATUS: ECOG performance status: {findings; ecog performance status:31780} Ambulatory status: {TNHAmbulation:25868}  CAREY 1 AND 3 YEAR INDEX Female (2pts) 75-79 or 80-84 (2pts) >84 (3pts) Dependence in toileting (1pt) Partial or full dependence in dressing (1pt) History of malignant neoplasm (2pts) CHF (3pts) COPD (1pts) CKD (3pts)  0-3 pts 6% 1 year mortality ; 21% 3 year mortality 4-5 pts 12% 1 year mortality ; 36% 3 year mortality >5 pts 21% 1 year mortality; 54% 3 year mortality  Past Medical History:  Diagnosis Date   Abnormal Pap smear    leep   Acute pain    Asthma    Family history of breast cancer    Family history of cancer of pituitary gland and craniopharyngeal duct    Family history of colon cancer    Family history of ovarian cancer    Family history of prostate cancer    Family history of uterine cancer    HTN  (hypertension)    Hypertension    Hypertriglyceridemia    Infertility    Obesity    PCOS (polycystic ovarian syndrome)    Polycystic ovary    Positive RPR test 06/11/2019   Repeat neg January 2021   Prediabetes    Sinusitis    Temporal headache    Thoracic spine pain    TMJ (dislocation of temporomandibular joint)    Viral syndrome    Vitamin D deficiency     Past Surgical History:  Procedure Laterality Date   LEEP  2006    Family History  Problem Relation Age of Onset   Hypertension Father    Ovarian cancer Mother        dx early 19s   Ulcerative colitis Mother    Cystic fibrosis Mother    Uterine cancer Mother        dx early 76s   Breast cancer Mother        dx 52s   Cancer Mother        pituitary gland cancer, dx 21s   Kidney disease Maternal Uncle    Irritable bowel syndrome Paternal Aunt    Diabetes Maternal Grandmother    Diabetes Maternal Grandfather    Prostate cancer Maternal Grandfather 90   Diabetes Paternal Grandmother    Diabetes Paternal Grandfather    Cancer Paternal Grandfather        unknown type, d.60s/70s   Ovarian cancer Other        MGGM's mother   Colon cancer Maternal Uncle        dx 13s   Prostate cancer Maternal Uncle        dx 73s   Ovarian cancer Maternal Great-grandmother  MGM's mother   Ovarian cancer Other        MGGM's sisters x3    Social History   Socioeconomic History   Marital status: Married    Spouse name: Not on file   Number of children: 1   Years of education: Not on file   Highest education level: Not on file  Occupational History   Occupation: Stylist  Tobacco Use   Smoking status: Never   Smokeless tobacco: Never  Vaping Use   Vaping status: Never Used  Substance and Sexual Activity   Alcohol use: Yes    Comment: social, rare   Drug use: No   Sexual activity: Yes    Birth control/protection: None  Other Topics Concern   Not on file  Social History Narrative   Not on file   Social  Drivers of Health   Financial Resource Strain: Low Risk  (03/18/2024)   Overall Financial Resource Strain (CARDIA)    Difficulty of Paying Living Expenses: Not hard at all  Food Insecurity: No Food Insecurity (03/18/2024)   Hunger Vital Sign    Worried About Running Out of Food in the Last Year: Never true    Ran Out of Food in the Last Year: Never true  Transportation Needs: No Transportation Needs (09/27/2023)   PRAPARE - Administrator, Civil Service (Medical): No    Lack of Transportation (Non-Medical): No  Physical Activity: Inactive (03/18/2024)   Exercise Vital Sign    Days of Exercise per Week: 0 days    Minutes of Exercise per Session: 0 min  Stress: Stress Concern Present (03/18/2024)   [employer] of Occupational Health - Occupational Stress Questionnaire    Feeling of Stress: Very much  Social Connections: Moderately Isolated (03/18/2024)   Social Connection and Isolation Panel    Frequency of Communication with Friends and Family: More than three times a week    Frequency of Social Gatherings with Friends and Family: Once a week    Attends Religious Services: Never    Database administrator or Organizations: No    Attends Banker Meetings: Never    Marital Status: Married  Catering manager Violence: Not At Risk (03/18/2024)   Humiliation, Afraid, Rape, and Kick questionnaire    Fear of Current or Ex-Partner: No    Emotionally Abused: No    Physically Abused: No    Sexually Abused: No    Allergies  Allergen Reactions   Septra [Bactrim] Anaphylaxis   Sulfamethoxazole-Trimethoprim Anaphylaxis   Metronidazole  Nausea And Vomiting, Rash and Other (See Comments)    Severe yeast infection  Yeast infection    Current Outpatient Medications  Medication Sig Dispense Refill   amLODipine  (NORVASC ) 10 MG tablet Take 1 tablet (10 mg total) by mouth daily. 90 tablet 3   clindamycin  (CLEOCIN ) 300 MG capsule Take 1 capsule (300 mg total) by mouth in the  morning and at bedtime. 14 capsule 3   diphenhydrAMINE  (BENADRYL ) 25 mg capsule Take 25 mg by mouth every 6 (six) hours as needed for allergies.     escitalopram  (LEXAPRO ) 10 MG tablet Take 1 tablet (10 mg total) by mouth daily. 90 tablet 1   hydrochlorothiazide  (HYDRODIURIL ) 25 MG tablet Take 1 tablet (25 mg total) by mouth daily. 90 tablet 3   Multiple Vitamin (MULTIVITAMIN WITH MINERALS) TABS tablet Take 1 tablet by mouth daily.     Probiotic Product (PROBIOTIC PO) Take 1 capsule by mouth daily.  semaglutide -weight management (WEGOVY ) 0.25 MG/0.5ML SOAJ SQ injection Inject 0.25 mg into the skin once a week.     semaglutide -weight management (WEGOVY ) 0.5 MG/0.5ML SOAJ SQ injection Inject 0.5 mg into the skin once a week. FOR 4 WEEKS WHEN YOU FINISH THE 0.25 MG 2 mL 0   WEGOVY  0.25 MG/0.5ML SOAJ Inject 0.25 mg into the skin once a week. (Patient not taking: Reported on 05/22/2024)     No current facility-administered medications for this visit.    PHYSICAL EXAM There were no vitals filed for this visit. ***  PERTINENT LABORATORY AND RADIOLOGIC DATA  Most recent CBC    Latest Ref Rng & Units 10/30/2023    9:33 AM 10/13/2016   11:11 AM 06/05/2011    7:35 PM  CBC  WBC 3.4 - 10.8 x10E3/uL 3.6  3.6  5.0   Hemoglobin 11.1 - 15.9 g/dL 86.7  87.0  87.5   Hematocrit 34.0 - 46.6 % 39.6  37.5  36.1   Platelets 150 - 450 x10E3/uL 314  267  267      Most recent CMP    Latest Ref Rng & Units 03/03/2024   12:54 PM 10/30/2023    9:33 AM 04/04/2016    5:06 PM  CMP  Glucose 70 - 99 mg/dL 78  82    BUN 6 - 24 mg/dL 8  7    Creatinine 9.42 - 1.00 mg/dL 9.21  9.17  9.07   Sodium 134 - 144 mmol/L 139  139    Potassium 3.5 - 5.2 mmol/L 4.4  4.5    Chloride 96 - 106 mmol/L 104  104    CO2 20 - 29 mmol/L 20  20    Calcium 8.7 - 10.2 mg/dL 9.8  9.4    Total Protein 6.0 - 8.5 g/dL  6.8    Total Bilirubin 0.0 - 1.2 mg/dL  0.4    Alkaline Phos 44 - 121 IU/L  39    AST 0 - 40 IU/L  16    ALT 0 - 32  IU/L  13      Renal function CrCl cannot be calculated (Patient's most recent lab result is older than the maximum 21 days allowed.).  Hgb A1c MFr Bld (%)  Date Value  10/30/2023 4.8    LDL Chol Calc (NIH)  Date Value Ref Range Status  10/30/2023 67 0 - 99 mg/dL Final     Vascular Imaging: ***  Jasmine Castaneda N. Magda, MD FACS [facility] Phone Number: [phone] 06/23/2024 7:31 AM   Total time spent on preparing this encounter including chart review, data review, collecting history, examining the patient, and coordinating care: {tnhtimebilling:26202} {billinglist:27273}  Portions of this report may have been transcribed using voice recognition software.  Every effort has been made to ensure accuracy; however, inadvertent computerized transcription errors may still be present.

## 2024-06-24 ENCOUNTER — Encounter: Payer: Self-pay | Admitting: Vascular Surgery

## 2024-06-24 ENCOUNTER — Ambulatory Visit: Attending: Vascular Surgery | Admitting: Vascular Surgery

## 2024-06-24 VITALS — BP 114/83 | HR 73 | Temp 98.5°F | Ht 68.0 in | Wt 188.0 lb

## 2024-06-24 DIAGNOSIS — I871 Compression of vein: Secondary | ICD-10-CM | POA: Diagnosis present

## 2024-06-25 ENCOUNTER — Other Ambulatory Visit: Payer: Self-pay

## 2024-06-25 DIAGNOSIS — I871 Compression of vein: Secondary | ICD-10-CM

## 2024-06-30 ENCOUNTER — Other Ambulatory Visit (HOSPITAL_COMMUNITY): Payer: Self-pay

## 2024-06-30 MED ORDER — SODIUM CHLORIDE 1 G PO TABS
2.0000 g | ORAL_TABLET | Freq: Three times a day (TID) | ORAL | 0 refills | Status: AC
  Filled 2024-06-30: qty 18, 3d supply, fill #0

## 2024-07-01 ENCOUNTER — Other Ambulatory Visit (HOSPITAL_COMMUNITY): Payer: Self-pay

## 2024-07-01 ENCOUNTER — Encounter (HOSPITAL_BASED_OUTPATIENT_CLINIC_OR_DEPARTMENT_OTHER): Payer: Self-pay | Admitting: *Deleted

## 2024-07-03 ENCOUNTER — Other Ambulatory Visit (HOSPITAL_BASED_OUTPATIENT_CLINIC_OR_DEPARTMENT_OTHER): Payer: Self-pay | Admitting: Family

## 2024-07-03 DIAGNOSIS — Z6828 Body mass index (BMI) 28.0-28.9, adult: Secondary | ICD-10-CM

## 2024-07-03 DIAGNOSIS — E282 Polycystic ovarian syndrome: Secondary | ICD-10-CM

## 2024-07-04 NOTE — Telephone Encounter (Signed)
 Refill Request.

## 2024-07-09 ENCOUNTER — Telehealth: Payer: Self-pay | Admitting: Pharmacy Technician

## 2024-07-09 NOTE — Telephone Encounter (Signed)
Patient would like to increase to 1mg 

## 2024-07-09 NOTE — Telephone Encounter (Signed)
   Pharmacy Patient Advocate Encounter   Received notification from Onbase that prior authorization for wegovy  is required/requested.   Insurance verification completed.   The patient is insured through [facility] MEDICAID.   Per test claim: PA required; PA submitted to above mentioned insurance via Latent Key/confirmation #/EOC [id number] Status is pending    Never had a bmi over 79, patient age not over 31, no record of mi/stroke/pad

## 2024-07-10 ENCOUNTER — Ambulatory Visit (HOSPITAL_COMMUNITY)
Admission: RE | Admit: 2024-07-10 | Discharge: 2024-07-10 | Disposition: A | Discharge status: Home / Self Care | Source: Ambulatory Visit | Attending: Vascular Surgery | Admitting: Vascular Surgery

## 2024-07-10 DIAGNOSIS — I871 Compression of vein: Secondary | ICD-10-CM | POA: Insufficient documentation

## 2024-07-10 MED ORDER — IOHEXOL 350 MG/ML SOLN
100.0000 mL | Freq: Once | INTRAVENOUS | Status: AC | PRN
  Administered 2024-07-10: 100 mL via INTRAVENOUS

## 2024-07-10 NOTE — Telephone Encounter (Signed)
 Pharmacy Patient Advocate Encounter  Received notification from [facility] MEDICAID that Prior Authorization for wegovy  has been DENIED.  See denial reason below. No denial letter attached in CMM. Will attach denial letter to Media tab once received.  Per insurance to be approved for wegovy  the patient has to meet the following criteria as of 06/11/24:   Insurance no longer pays for weight loss medications as of 06/11/24

## 2024-07-10 NOTE — Telephone Encounter (Signed)
 Will forward this message to our PharmD team to make them aware of wegovy  denial from [facility].

## 2024-07-11 ENCOUNTER — Other Ambulatory Visit (HOSPITAL_COMMUNITY): Payer: Self-pay

## 2024-07-21 NOTE — Progress Notes (Unsigned)
 [facility] [place]  ASSESSMENT / PLAN: 43 y.o. 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  She has some microscopic hematuria.  Does not really have any abdominal pain or flank pain.  Will check CT angiogram of the abdomen pelvis to confirm anatomic compression of the left renal vein.  Options would include watchful waiting, stenting, and open surgical management with transposition of the left renal vein.  The general consensus is that intervention should be reserved for symptomatic patients with a constellation of symptoms that can be explained by compression of the left renal vein.  This is not typically a cause of renovascular hypertension.  I will see her back after the CT angiogram.  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  The renal artery duplex was performed to evaluate for hypertension and a otherwise young healthy woman.  She is under the care of Annabella Scarce for her hypertension and has pretty good control at this point.  She is losing some weight which is helping things.  We reviewed her duplex in detail counseled her that she may indeed have based on recent urinalysis that did show some hematuria.  We agreed upon a plan of getting CT imaging to compression before proceeding with any kind of treatment strategy. Past Medical History:  Diagnosis Date   Abnormal Pap smear    leep   Acute pain    Asthma    Family history of breast cancer    Family history of cancer of pituitary gland and craniopharyngeal duct    Family history of colon cancer    Family history of ovarian cancer    Family history of prostate cancer     Family history of uterine cancer    HTN (hypertension)    Hypertension    Hypertriglyceridemia    Infertility    Obesity    PCOS (polycystic ovarian syndrome)    Polycystic ovary    Positive RPR test 06/11/2019   Repeat neg January 2021   Prediabetes    Sinusitis    Temporal headache    Thoracic spine pain    TMJ (dislocation of temporomandibular joint)    Viral syndrome    Vitamin D deficiency     Past Surgical History:  Procedure Laterality Date   LEEP  2006    Family History  Problem Relation Age of Onset   Hypertension Father    Ovarian cancer Mother        dx early 101s   Ulcerative colitis Mother    Cystic fibrosis Mother    Uterine cancer Mother        dx early 32s   Breast cancer Mother        dx 102s   Cancer Mother        pituitary gland cancer, dx 41s   Kidney disease Maternal Uncle    Irritable bowel syndrome Paternal Aunt    Diabetes Maternal Grandmother    Diabetes Maternal Grandfather    Prostate cancer Maternal Grandfather 42   Diabetes Paternal Grandmother    Diabetes Paternal Grandfather    Cancer Paternal Grandfather        unknown type, d.60s/70s   Ovarian cancer Other  MGGM's mother   Colon cancer Maternal Uncle        dx 19s   Prostate cancer Maternal Uncle        dx 98s   Ovarian cancer Maternal Great-grandmother        MGM's mother   Ovarian cancer Other        MGGM's sisters x3    Social History   Socioeconomic History   Marital status: Married    Spouse name: Not on file   Number of children: 1   Years of education: Not on file   Highest education level: Not on file  Occupational History   Occupation: Stylist  Tobacco Use   Smoking status: Never   Smokeless tobacco: Never  Vaping Use   Vaping status: Never Used  Substance and Sexual Activity   Alcohol use: Yes    Comment: social, rare   Drug use: No   Sexual activity: Yes    Birth control/protection: None  Other Topics Concern   Not on file  Social History  Narrative   Not on file   Social Drivers of Health   Financial Resource Strain: Low Risk  (03/18/2024)   Overall Financial Resource Strain (CARDIA)    Difficulty of Paying Living Expenses: Not hard at all  Food Insecurity: No Food Insecurity (03/18/2024)   Hunger Vital Sign    Worried About Running Out of Food in the Last Year: Never true    Ran Out of Food in the Last Year: Never true  Transportation Needs: No Transportation Needs (09/27/2023)   PRAPARE - Administrator, Civil Service (Medical): No    Lack of Transportation (Non-Medical): No  Physical Activity: Inactive (03/18/2024)   Exercise Vital Sign    Days of Exercise per Week: 0 days    Minutes of Exercise per Session: 0 min  Stress: Stress Concern Present (03/18/2024)   [employer] of Occupational Health - Occupational Stress Questionnaire    Feeling of Stress: Very much  Social Connections: Moderately Isolated (03/18/2024)   Social Connection and Isolation Panel    Frequency of Communication with Friends and Family: More than three times a week    Frequency of Social Gatherings with Friends and Family: Once a week    Attends Religious Services: Never    Database Administrator or Organizations: No    Attends Banker Meetings: Never    Marital Status: Married  Catering Manager Violence: Not At Risk (03/18/2024)   Humiliation, Afraid, Rape, and Kick questionnaire    Fear of Current or Ex-Partner: No    Emotionally Abused: No    Physically Abused: No    Sexually Abused: No    Allergies  Allergen Reactions   Septra [Bactrim] Anaphylaxis   Sulfamethoxazole-Trimethoprim Anaphylaxis   Metronidazole  Nausea And Vomiting, Rash and Other (See Comments)    Severe yeast infection  Yeast infection    Current Outpatient Medications  Medication Sig Dispense Refill   amLODipine  (NORVASC ) 10 MG tablet Take 1 tablet (10 mg total) by mouth daily. 90 tablet 3   clindamycin  (CLEOCIN ) 300 MG capsule Take 1  capsule (300 mg total) by mouth in the morning and at bedtime. 14 capsule 3   diphenhydrAMINE  (BENADRYL ) 25 mg capsule Take 25 mg by mouth every 6 (six) hours as needed for allergies.     escitalopram  (LEXAPRO ) 10 MG tablet Take 1 tablet (10 mg total) by mouth daily. 90 tablet 1   hydrochlorothiazide  (HYDRODIURIL ) 25 MG tablet  Take 1 tablet (25 mg total) by mouth daily. 90 tablet 3   Multiple Vitamin (MULTIVITAMIN WITH MINERALS) TABS tablet Take 1 tablet by mouth daily.     Probiotic Product (PROBIOTIC PO) Take 1 capsule by mouth daily.     semaglutide -weight management (WEGOVY ) 1 MG/0.5ML SOAJ SQ injection Inject 1 mg into the skin once a week. 2 mL 0   No current facility-administered medications for this visit.    PHYSICAL EXAM There were no vitals filed for this visit.  Well-appearing middle-aged woman in no distress Regular rate and rhythm Unlabored breathing   PERTINENT LABORATORY AND RADIOLOGIC DATA  Most recent CBC    Latest Ref Rng & Units 10/30/2023    9:33 AM 10/13/2016   11:11 AM 06/05/2011    7:35 PM  CBC  WBC 3.4 - 10.8 x10E3/uL 3.6  3.6  5.0   Hemoglobin 11.1 - 15.9 g/dL 86.7  87.0  87.5   Hematocrit 34.0 - 46.6 % 39.6  37.5  36.1   Platelets 150 - 450 x10E3/uL 314  267  267      Most recent CMP    Latest Ref Rng & Units 03/03/2024   12:54 PM 10/30/2023    9:33 AM 04/04/2016    5:06 PM  CMP  Glucose 70 - 99 mg/dL 78  82    BUN 6 - 24 mg/dL 8  7    Creatinine 9.42 - 1.00 mg/dL 9.21  9.17  9.07   Sodium 134 - 144 mmol/L 139  139    Potassium 3.5 - 5.2 mmol/L 4.4  4.5    Chloride 96 - 106 mmol/L 104  104    CO2 20 - 29 mmol/L 20  20    Calcium 8.7 - 10.2 mg/dL 9.8  9.4    Total Protein 6.0 - 8.5 g/dL  6.8    Total Bilirubin 0.0 - 1.2 mg/dL  0.4    Alkaline Phos 44 - 121 IU/L  39    AST 0 - 40 IU/L  16    ALT 0 - 32 IU/L  13      Renal function CrCl cannot be calculated (Patient's most recent lab result is older than the maximum 21 days allowed.).  Hgb  A1c MFr Bld (%)  Date Value  10/30/2023 4.8    LDL Chol Calc (NIH)  Date Value Ref Range Status  10/30/2023 67 0 - 99 mg/dL Final    ABDOMINAL VISCERAL   Patient Name:  Jasmine Castaneda  Date of Exam:   04/21/2024  Medical Rec #: [id number]             Accession #:    [id number]  Date of Birth: [birth year]            Patient Gender: F  Patient Age:   10 years  Exam Location:  [address]  Procedure:      VAS US  RENAL ARTERY DUPLEX  Referring Phys: [id number] DAVID W HARDING    ---------------------------------------------------------------------------  -----   Indications: Hypertension   Comparison Study: No prior study   Performing Technologist: Rosaline Fujisawa MHA, RDMS, RVT, RDCS     Examination Guidelines: A complete evaluation includes B-mode imaging,  spectral  Doppler, color Doppler, and power Doppler as needed of all accessible  portions  of each vessel. Bilateral testing is considered an integral part of a  complete  examination. Limited examinations for reoccurring indications may be  performed  as noted.  Duplex Findings:  +--------------------+--------+--------+------+--------+  Mesenteric         PSV cm/sEDV cm/sPlaqueComments  +--------------------+--------+--------+------+--------+  Aorta Prox            104                           +--------------------+--------+--------+------+--------+  Aorta Mid              69                           +--------------------+--------+--------+------+--------+  Celiac Artery Origin  161                           +--------------------+--------+--------+------+--------+  SMA Proximal          156      22                   +--------------------+--------+--------+------+--------+           +------------------+--------+--------+-------+  Right Renal ArteryPSV cm/sEDV cm/sComment  +------------------+--------+--------+-------+  Origin              96      33             +------------------+--------+--------+-------+  Proximal           100      42            +------------------+--------+--------+-------+  Mid                106      50            +------------------+--------+--------+-------+  Distal              92      34            +------------------+--------+--------+-------+   +-----------------+--------+--------+-------+  Left Renal ArteryPSV cm/sEDV cm/sComment  +-----------------+--------+--------+-------+  Origin             79             28     +-----------------+--------+--------+-------+  Proximal           79      28            +-----------------+--------+--------+-------+  Mid                46      16            +-----------------+--------+--------+-------+  Distal             55      19            +-----------------+--------+--------+-------+   +------------+--------+--------+----+-----------+--------+--------+----+  Right KidneyPSV cm/sEDV cm/sRI  Left KidneyPSV cm/sEDV cm/sRI    +------------+--------+--------+----+-----------+--------+--------+----+  Upper Pole  20      7       0.66Upper Pole 28      11      0.59  +------------+--------+--------+----+-----------+--------+--------+----+  Mid        41      16      0.        33      11      0.68  +------------+--------+--------+----+-----------+--------+--------+----+  Lower Pole  32      12      0.64Lower Pole 20      6  0.68  +------------+--------+--------+----+-----------+--------+--------+----+  Hilar      87      38      0.56Hilar      32      12      0.64  +------------+--------+--------+----+-----------+--------+--------+----+   +------------------+---------+------------------+---------+  Right Kidney               Left Kidney                  +------------------+---------+------------------+---------+  RAR                       RAR                           +------------------+---------+------------------+---------+  RAR (manual)      1.54     RAR (manual)      1.14       +------------------+---------+------------------+---------+  Cortex           23/7 cm/sCortex            20/8 cm/s  +------------------+---------+------------------+---------+  Cortex thickness           Corex thickness              +------------------+---------+------------------+---------+  Kidney length (cm)10.10    Kidney length (cm)11.50      +------------------+---------+------------------+---------+     Summary:  Renal:    Right: No evidence of right renal artery stenosis. RRV flow present.  Left:  No evidence of left renal artery stenosis.           The left renal vein exhibits elevated velocities just prior         to it draining into the IVC, with visible narrowing as it         passes between the SMA and aorta, with dilatation and         pulsatility as it drains from the left kidney. 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 Further imaging         may be warranted if clinically indicated.  Mesenteric:  Normal Celiac artery and Superior Mesenteric artery findings.  There is a visible defect in the mid aorta, with the aorta appearing to  cleave  with 2D and color Doppler, versus artifact versus unknown etiology.  Correlation  with 2D echocardiogram performed 04/21/2024 is available. Further imaging  may be  warranted if clinically indicated.    *See table(s) above for measurements and observations.    Diagnosing physician: Dorn Lesches MD    Electronically signed by Dorn Lesches MD on 04/22/2024 at 1:22:03 PM.    Debby SAILOR. Magda, MD [facility] Phone Number: [phone] 07/21/2024 4:14 PM   Total time spent on preparing this encounter including chart review, data review, collecting history, examining the patient, and coordinating care: 60 min  Portions of  this report may have been transcribed using voice recognition software.  Every effort has been made to ensure accuracy; however, inadvertent computerized transcription errors may still be present.

## 2024-07-22 ENCOUNTER — Encounter: Payer: Self-pay | Admitting: Vascular Surgery

## 2024-07-22 ENCOUNTER — Ambulatory Visit: Attending: Vascular Surgery | Admitting: Vascular Surgery

## 2024-07-22 VITALS — BP 132/96 | HR 83 | Temp 98.3°F | Ht 68.0 in | Wt 187.0 lb

## 2024-07-22 DIAGNOSIS — I10 Essential (primary) hypertension: Secondary | ICD-10-CM | POA: Insufficient documentation

## 2024-08-11 ENCOUNTER — Ambulatory Visit (INDEPENDENT_AMBULATORY_CARE_PROVIDER_SITE_OTHER): Admitting: Cardiovascular Disease

## 2024-08-11 ENCOUNTER — Encounter (HOSPITAL_BASED_OUTPATIENT_CLINIC_OR_DEPARTMENT_OTHER): Payer: Self-pay | Admitting: Cardiovascular Disease

## 2024-08-11 VITALS — BP 144/98 | HR 80 | Ht 68.0 in | Wt 197.0 lb

## 2024-08-11 DIAGNOSIS — E282 Polycystic ovarian syndrome: Secondary | ICD-10-CM | POA: Diagnosis not present

## 2024-08-11 DIAGNOSIS — I10 Essential (primary) hypertension: Secondary | ICD-10-CM | POA: Diagnosis not present

## 2024-08-11 MED ORDER — WEGOVY 0.5 MG/0.5ML ~~LOC~~ SOAJ: 0.5000 mg | SUBCUTANEOUS | 0 refills | Status: AC

## 2024-08-11 MED ORDER — WEGOVY 1 MG/0.5ML ~~LOC~~ SOAJ: 1.0000 mg | SUBCUTANEOUS | 5 refills | Status: AC

## 2024-08-11 MED ORDER — WEGOVY 0.25 MG/0.5ML ~~LOC~~ SOAJ: 0.2500 mg | SUBCUTANEOUS | 0 refills | Status: AC

## 2024-08-11 NOTE — Progress Notes (Signed)
 [facility] Initial Assessment:    Date:  08/11/2024   ID:  Jasmine Castaneda, DOB [birth year], MRN [id number]  PCP:  Rosalea Rosina SAILOR, PA  Cardiologist:  Alm Clay, MD  Nephrologist:  Referring MD: Rosalea Rosina SAILOR, PA   CC: Hypertension  History of Present Illness:    Jasmine Castaneda is a 43 y.o. female with a hx of hypertension, PCOS, hypothyroidism, hypertriglyceridemia here for follow up. She was first seen 03/2024 to establish care in the [facility].  She saw Dr. Clay 02/2024 for uncontrolled hypertension.  At the time she was on amlodipine  and hydrochlorothiazide .  Diastolic blood pressures were in the 120s to 130s.  She attributed her hypertension to stress and PCOS.  There was evidence of LVH on her EKG.  Amlodipine  and hydrochlorothiazide  were both increased.  She was referred for an echo and renal artery Dopplers but neither have been performed yet.  She has a history of PCOS and obesity.  She was started on Wegovy .  At her initial visit she reported chest pressure in stressful situations.  She reported a healthy diet and was a pescatrian.  Blood pressure was uncontrolled and her hydrochlorothiazide  and amlodipine  were resumed.  She was instructed to stop taking change on Zwart and to work on increasing exercise and limiting sodium intake.  She was also started on Lexapro  for stress management.  She followed up with Dr. Clay 05/2024 and blood pressure was 116/86.  She was referred for renal Dopplers which revealed normal blood flow in the right and left renal arteries.  There were elevated velocities in the left renal vein with visible narrowing between the SMA and aorta.  There was concern for possible nutcracker syndrome.  She was referred to vascular and had a CT-a of the abdomen and pelvis with no concern for renal vein compression.  Vascular recommended repeating urinalysis twice a year to make sure there is no evidence of  hematuria or proteinuria.  She followed up with Reche Finder, NP 05/2024 and her home blood pressures were well-controlled.  She had discontinued Wegovy .  And this was restarted at that appointment.  Hydrochlorothiazide  was reduced to 12.5 mg due to relative hypotension and lightheadedness.  Discussed the use of AI scribe software for clinical note transcription with the patient, who gave verbal consent to proceed.  History of Present Illness [name] has experienced weight gain, reaching 197 pounds, after discontinuing Wegovy  over a month ago due to insurance issues. Previously, she was on a regimen of 0.25 mg and 0.5 mg doses, which she found effective for weight management. She is frustrated with the inability to continue this medication due to cost and insurance coverage.  Her blood pressure has been generally well-controlled at home, with readings around 120/90 mmHg. However, she notes a recent elevation, which she attributes to wearing tight clothing and preparing for exercise. She has been alternating between 12.5 mg and 25 mg doses of hydrochlorothiazide , finding that the higher dose is more effective but causes increased thirst. She also takes amlodipine .  She has been inconsistent with her exercise routine but is making efforts to increase physical activity, including walking and participating in a new exercise challenge. She acknowledges the importance of combining exercise with medication for optimal health benefits.  She mentions a previous concern regarding her renal vein, for which she underwent a scan that showed normal results.  She is taking Lexapro  consistently now and reports feeling 'pretty chill' with its use, indicating  a positive impact on her mood.  Previous antihypertensives:    Past Medical History:  Diagnosis Date   Abnormal Pap smear    leep   Acute pain    Asthma    Family history of breast cancer    Family history of cancer of pituitary gland and  craniopharyngeal duct    Family history of colon cancer    Family history of ovarian cancer    Family history of prostate cancer    Family history of uterine cancer    HTN (hypertension)    Hypertension    Hypertriglyceridemia    Infertility    Obesity    PCOS (polycystic ovarian syndrome)    Polycystic ovary    Positive RPR test 06/11/2019   Repeat neg January 2021   Prediabetes    Sinusitis    Temporal headache    Thoracic spine pain    TMJ (dislocation of temporomandibular joint)    Viral syndrome    Vitamin D deficiency     Past Surgical History:  Procedure Laterality Date   LEEP  2006    Current Medications: Current Meds  Medication Sig   amLODipine  (NORVASC ) 10 MG tablet Take 1 tablet (10 mg total) by mouth daily.   clindamycin  (CLEOCIN ) 300 MG capsule Take 1 capsule (300 mg total) by mouth in the morning and at bedtime.   diphenhydrAMINE  (BENADRYL ) 25 mg capsule Take 25 mg by mouth every 6 (six) hours as needed for allergies.   escitalopram  (LEXAPRO ) 10 MG tablet Take 1 tablet (10 mg total) by mouth daily.   hydrochlorothiazide  (HYDRODIURIL ) 25 MG tablet Take 1 tablet (25 mg total) by mouth daily. (Patient taking differently: Take 25 mg by mouth daily. 1/2 tablet)   Multiple Vitamin (MULTIVITAMIN WITH MINERALS) TABS tablet Take 1 tablet by mouth daily.   Probiotic Product (PROBIOTIC PO) Take 1 capsule by mouth daily.   semaglutide -weight management (WEGOVY ) 0.25 MG/0.5ML SOAJ SQ injection Inject 0.25 mg into the skin once a week for 28 days. FOR 4 WEEKS   [START ON 09/08/2024] semaglutide -weight management (WEGOVY ) 0.5 MG/0.5ML SOAJ SQ injection Inject 0.5 mg into the skin once a week for 28 days. FOR 4 WEEKS WHEN YOU FINISH THE 0.25 MG   [START ON 10/06/2024] semaglutide -weight management (WEGOVY ) 1 MG/0.5ML SOAJ SQ injection Inject 1 mg into the skin once a week. WHEN YOU FINISH THE 0.5 MG     Allergies:   Septra [bactrim], Sulfamethoxazole-trimethoprim, and  Metronidazole    Social History   Socioeconomic History   Marital status: Married    Spouse name: Not on file   Number of children: 1   Years of education: Not on file   Highest education level: Not on file  Occupational History   Occupation: Stylist  Tobacco Use   Smoking status: Never   Smokeless tobacco: Never  Vaping Use   Vaping status: Never Used  Substance and Sexual Activity   Alcohol use: Yes    Comment: social, rare   Drug use: No   Sexual activity: Yes    Birth control/protection: None  Other Topics Concern   Not on file  Social History Narrative   Not on file   Social Drivers of Health   Financial Resource Strain: Low Risk  (03/18/2024)   Overall Financial Resource Strain (CARDIA)    Difficulty of Paying Living Expenses: Not hard at all  Food Insecurity: No Food Insecurity (03/18/2024)   Hunger Vital Sign    Worried About Running Out  of Food in the Last Year: Never true    Ran Out of Food in the Last Year: Never true  Transportation Needs: No Transportation Needs (09/27/2023)   PRAPARE - Administrator, Civil Service (Medical): No    Lack of Transportation (Non-Medical): No  Physical Activity: Inactive (03/18/2024)   Exercise Vital Sign    Days of Exercise per Week: 0 days    Minutes of Exercise per Session: 0 min  Stress: Stress Concern Present (03/18/2024)   [employer] of Occupational Health - Occupational Stress Questionnaire    Feeling of Stress: Very much  Social Connections: Moderately Isolated (03/18/2024)   Social Connection and Isolation Panel    Frequency of Communication with Friends and Family: More than three times a week    Frequency of Social Gatherings with Friends and Family: Once a week    Attends Religious Services: Never    Database Administrator or Organizations: No    Attends Engineer, Structural: Never    Marital Status: Married     Family History: The patient's family history includes Breast cancer in her  mother; Cancer in her mother and paternal grandfather; Colon cancer in her maternal uncle; Cystic fibrosis in her mother; Diabetes in her maternal grandfather, maternal grandmother, paternal grandfather, and paternal grandmother; Hypertension in her father; Irritable bowel syndrome in her paternal aunt; Kidney disease in her maternal uncle; Ovarian cancer in her maternal great-grandmother, mother, and other family members; Prostate cancer in her maternal uncle; Prostate cancer (age of onset: 48) in her maternal grandfather; Ulcerative colitis in her mother; Uterine cancer in her mother.  ROS:   Please see the history of present illness.     All other systems reviewed and are negative.  EKGs/Labs/Other Studies Reviewed:    EKG:  EKG is not ordered today.  EKG Interpretation Date/Time:    Ventricular Rate:    PR Interval:    QRS Duration:    QT Interval:    QTC Calculation:   R Axis:      Text Interpretation:         Recent Labs: 10/30/2023: ALT 13; Hemoglobin 13.2; Platelets 314 03/03/2024: BUN 8; Creatinine, Ser 0.78; Potassium 4.4; Sodium 139 05/21/2024: TSH 0.607   Recent Lipid Panel    Component Value Date/Time   CHOL 184 10/30/2023 0933   TRIG 108 10/30/2023 0933   HDL 98 10/30/2023 0933   CHOLHDL 1.9 10/30/2023 0933   LDLCALC 67 10/30/2023 0933    Physical Exam:   VS:  BP (!) 144/98   Pulse 80   Ht 5' 8 (1.727 m)   Wt 197 lb (89.4 kg)   SpO2 99%   BMI 29.95 kg/m  , BMI Body mass index is 29.95 kg/m. GENERAL:  Well appearing HEENT: Pupils equal round and reactive, fundi not visualized, oral mucosa unremarkable NECK:  No jugular venous distention, waveform within normal limits, carotid upstroke brisk and symmetric, no bruits, no thyromegaly LUNGS:  Clear to auscultation bilaterally HEART:  RRR.  PMI not displaced or sustained,S1 and S2 within normal limits, no S3, no S4, no clicks, no rubs, no murmurs ABD:  Flat, positive bowel sounds normal in frequency in pitch,  no bruits, no rebound, no guarding, no midline pulsatile mass, no hepatomegaly, no splenomegaly EXT:  2 plus pulses throughout, no edema, no cyanosis no clubbing SKIN:  No rashes no nodules NEURO:  Cranial nerves II through XII grossly intact, motor grossly intact throughout PSYCH:  Cognitively  intact, oriented to person place and time   ASSESSMENT/PLAN:    Assessment & Plan # Hypertension Well-controlled with current medication. Today's elevated reading likely due to compression garments. No significant side effects from amlodipine  and hydrochlorothiazide . - Continue amlodipine  and hydrochlorothiazide . - Monitor home blood pressure, target 120/80 mmHg. - Consider compression garments for orthostatic hypotension. - Reassess in six months.  # Obesity: # PCOS: Management complicated by Wegovy  discontinuation due to insurance. Current weight 197 lbs. CT scan ruled out renal and adrenal issues. - Attempt to obtain Wegovy  samples or prescribe 0.25 mg. - Encourage consistent exercise, 150 minutes weekly.  # Depressive disorder Improved mood with Lexapro . - Continue Lexapro  as prescribed. - Continue to encourage exercise.   Screening for Secondary Hypertension:     03/18/2024   11:07 AM  Causes  Drugs/Herbals Screened     - Comments limits salt.  Pescatarian.  No caffeine.  Social EtOH.  No tob. + [place] wart.  +NSAIDs  Renovascular HTN Screened     - Comments renal Dopplers pending  Sleep Apnea Screened  Thyroid Disease Not Screened  Hyperaldosteronism Screened  Pheochromocytoma N/A  Cushing's Syndrome N/A  Hyperparathyroidism Screened  Coarctation of the Aorta Screened     - Comments BP symmetric  Compliance Screened     - Comments Not currently taking medication as prescribed    Relevant Labs/Studies:    Latest Ref Rng & Units 03/03/2024   12:54 PM 10/30/2023    9:33 AM 04/04/2016    5:06 PM  Basic Labs  Sodium 134 - 144 mmol/L 139  139    Potassium 3.5 - 5.2  mmol/L 4.4  4.5    Creatinine 0.57 - 1.00 mg/dL 9.21  9.17  9.07        Latest Ref Rng & Units 05/21/2024    8:53 AM 09/29/2019    4:59 PM  Thyroid   TSH 0.450 - 4.500 uIU/mL 0.607  0.477        Latest Ref Rng & Units 05/21/2024    8:53 AM  Renin/Aldosterone   Aldosterone 0.0 - 30.0 ng/dL 81.7   Aldos/Renin Ratio 0.0 - 30.0 20.5              04/21/2024   11:41 AM  Renovascular   Renal Artery US  Completed Yes    Disposition:    FU with MD/PharmD in 6 months    Medication Adjustments/Labs and Tests Ordered: Current medicines are reviewed at length with the patient today.  Concerns regarding medicines are outlined above.  No orders of the defined types were placed in this encounter.  Meds ordered this encounter  Medications   semaglutide -weight management (WEGOVY ) 0.25 MG/0.5ML SOAJ SQ injection    Sig: Inject 0.25 mg into the skin once a week for 28 days. FOR 4 WEEKS    Dispense:  2 mL    Refill:  0   semaglutide -weight management (WEGOVY ) 0.5 MG/0.5ML SOAJ SQ injection    Sig: Inject 0.5 mg into the skin once a week for 28 days. FOR 4 WEEKS WHEN YOU FINISH THE 0.25 MG    Dispense:  2 mL    Refill:  0   semaglutide -weight management (WEGOVY ) 1 MG/0.5ML SOAJ SQ injection    Sig: Inject 1 mg into the skin once a week. WHEN YOU FINISH THE 0.5 MG    Dispense:  2 mL    Refill:  5     Signed, Annabella Scarce, MD  08/11/2024 5:44 PM  [facility]

## 2024-08-11 NOTE — Patient Instructions (Signed)
 Medication Instructions:  START WEGOVY   INJECT 0.25 MG WEEKLY FOR 4 WEEKS  INCREASE TO 0.5 MG WEEKLY FOR 4 WEEKS,  INCREASE TO 1 MG WEEKLY FROM THEN ON   Labwork: NONE   Testing/Procedures: NONE  Follow-Up: 6 MONTHS WITH DR Fletcher OR CAITLIN W NP   If you need a refill on your cardiac medications before your next appointment, please call your pharmacy.

## 2024-09-10 ENCOUNTER — Telehealth: Payer: Self-pay | Admitting: Family Medicine

## 2024-09-10 NOTE — Telephone Encounter (Signed)
 Patient called to schedule an appointment for swelling in her breast. She stated she has called three other places, and everybody is booked until February. I told her we too did not have anything till February. She said it was painful, so she would just go to the hospital.

## 2024-09-16 ENCOUNTER — Ambulatory Visit: Payer: Self-pay | Admitting: Obstetrics and Gynecology

## 2024-09-16 VITALS — BP 154/106 | HR 77 | Ht 68.0 in | Wt 196.2 lb

## 2024-09-16 DIAGNOSIS — Z1231 Encounter for screening mammogram for malignant neoplasm of breast: Secondary | ICD-10-CM

## 2024-09-16 DIAGNOSIS — N644 Mastodynia: Secondary | ICD-10-CM | POA: Diagnosis not present

## 2024-09-16 NOTE — Progress Notes (Signed)
 Pt presents for swollen and painful breast.

## 2024-09-16 NOTE — Progress Notes (Signed)
" °  CC: breast pain Subjective:    Patient ID: Jasmine Castaneda, female    DOB: March 08, 1981, 44 y.o.   MRN: [id number]  HPI 44 yo 1P1 seen for eval of breast pain.  Pt has 1 month hx of left breast pain and swelling.  Pt has little or no menses due to progesterone  IUD. Pt has not taken anything for pain.  Pt denies increased caffeine intake.   Review of Systems     Objective:   Physical Exam Vitals:   09/16/24 1004 09/16/24 1008  BP: (!) 172/112 (!) 154/106  Pulse: 73 77  Breast exam:  breasts are symmetric in size, no erythema or surface skin changes. No palpable breast or axillary masses noted.       Assessment & Plan:   1. Breast pain, left (Primary) No palpable mass noted, will get mammogram and breast ultrasound - US  LIMITED ULTRASOUND INCLUDING AXILLA LEFT BREAST ; Future - MM 3D DIAGNOSTIC MAMMOGRAM UNILATERAL LEFT BREAST; Future  2. Screening mammogram for breast cancer  - MM 3D DIAGNOSTIC MAMMOGRAM UNILATERAL LEFT BREAST; Future    Jerilynn DELENA Buddle, MD Faculty Attending, [facility]  "

## 2024-09-23 ENCOUNTER — Ambulatory Visit

## 2024-09-23 ENCOUNTER — Ambulatory Visit
Admission: RE | Admit: 2024-09-23 | Discharge: 2024-09-23 | Disposition: A | Source: Ambulatory Visit | Attending: Obstetrics and Gynecology

## 2024-09-23 DIAGNOSIS — N644 Mastodynia: Secondary | ICD-10-CM

## 2024-09-23 DIAGNOSIS — Z1231 Encounter for screening mammogram for malignant neoplasm of breast: Secondary | ICD-10-CM

## 2024-09-25 ENCOUNTER — Ambulatory Visit: Payer: Self-pay | Admitting: Obstetrics and Gynecology

## 2024-10-21 ENCOUNTER — Ambulatory Visit: Payer: Self-pay | Admitting: Certified Nurse Midwife
